# Patient Record
Sex: Female | Born: 1970 | Race: White | Hispanic: No | Smoking: Former smoker
Health system: Southern US, Community
[De-identification: ages and names within clinical notes are randomized; demographics above are authoritative.]

## PROBLEM LIST (undated history)

## (undated) ENCOUNTER — Ambulatory Visit: Payer: BC Managed Care – PPO

## (undated) DIAGNOSIS — F419 Anxiety disorder, unspecified: Secondary | ICD-10-CM

## (undated) DIAGNOSIS — F32A Depression, unspecified: Secondary | ICD-10-CM

## (undated) DIAGNOSIS — R000 Tachycardia, unspecified: Secondary | ICD-10-CM

## (undated) DIAGNOSIS — G4730 Sleep apnea, unspecified: Secondary | ICD-10-CM

## (undated) DIAGNOSIS — K219 Gastro-esophageal reflux disease without esophagitis: Secondary | ICD-10-CM

## (undated) HISTORY — DX: Gastro-esophageal reflux disease without esophagitis: K21.9

## (undated) HISTORY — DX: Depression, unspecified: F32.A

## (undated) HISTORY — DX: Anxiety disorder, unspecified: F41.9

---

## 2000-05-23 HISTORY — PX: WISDOM TOOTH EXTRACTION: SHX21

## 2019-05-24 HISTORY — PX: COLONOSCOPY: SHX174

## 2019-05-24 HISTORY — PX: ABLATION: SHX5711

## 2020-03-26 DIAGNOSIS — K5900 Constipation, unspecified: Secondary | ICD-10-CM | POA: Diagnosis not present

## 2020-03-26 DIAGNOSIS — R1013 Epigastric pain: Secondary | ICD-10-CM | POA: Diagnosis not present

## 2020-04-20 DIAGNOSIS — D122 Benign neoplasm of ascending colon: Secondary | ICD-10-CM | POA: Diagnosis not present

## 2020-04-20 DIAGNOSIS — Z1211 Encounter for screening for malignant neoplasm of colon: Secondary | ICD-10-CM | POA: Diagnosis not present

## 2020-04-20 DIAGNOSIS — K219 Gastro-esophageal reflux disease without esophagitis: Secondary | ICD-10-CM | POA: Diagnosis not present

## 2020-04-20 LAB — HM COLONOSCOPY

## 2020-04-28 DIAGNOSIS — R194 Change in bowel habit: Secondary | ICD-10-CM | POA: Diagnosis not present

## 2020-05-12 DIAGNOSIS — K635 Polyp of colon: Secondary | ICD-10-CM | POA: Diagnosis not present

## 2020-05-12 DIAGNOSIS — A048 Other specified bacterial intestinal infections: Secondary | ICD-10-CM | POA: Diagnosis not present

## 2020-05-27 DIAGNOSIS — Z0000 Encounter for general adult medical examination without abnormal findings: Secondary | ICD-10-CM | POA: Diagnosis not present

## 2020-05-27 DIAGNOSIS — B0089 Other herpesviral infection: Secondary | ICD-10-CM | POA: Diagnosis not present

## 2020-05-27 DIAGNOSIS — K2100 Gastro-esophageal reflux disease with esophagitis, without bleeding: Secondary | ICD-10-CM | POA: Diagnosis not present

## 2020-05-27 DIAGNOSIS — I10 Essential (primary) hypertension: Secondary | ICD-10-CM | POA: Diagnosis not present

## 2020-05-27 DIAGNOSIS — G609 Hereditary and idiopathic neuropathy, unspecified: Secondary | ICD-10-CM | POA: Diagnosis not present

## 2020-05-27 DIAGNOSIS — E559 Vitamin D deficiency, unspecified: Secondary | ICD-10-CM | POA: Diagnosis not present

## 2020-05-27 DIAGNOSIS — Z1231 Encounter for screening mammogram for malignant neoplasm of breast: Secondary | ICD-10-CM | POA: Diagnosis not present

## 2020-08-19 DIAGNOSIS — Z72820 Sleep deprivation: Secondary | ICD-10-CM | POA: Diagnosis not present

## 2020-08-19 DIAGNOSIS — F411 Generalized anxiety disorder: Secondary | ICD-10-CM | POA: Diagnosis not present

## 2020-08-19 DIAGNOSIS — K2100 Gastro-esophageal reflux disease with esophagitis, without bleeding: Secondary | ICD-10-CM | POA: Diagnosis not present

## 2020-09-03 ENCOUNTER — Ambulatory Visit
Admission: EM | Admit: 2020-09-03 | Discharge: 2020-09-03 | Disposition: A | Discharge status: Home / Self Care | Payer: BC Managed Care – PPO

## 2020-09-03 ENCOUNTER — Other Ambulatory Visit: Payer: Self-pay

## 2020-09-03 DIAGNOSIS — J309 Allergic rhinitis, unspecified: Secondary | ICD-10-CM | POA: Diagnosis not present

## 2020-09-03 HISTORY — DX: Tachycardia, unspecified: R00.0

## 2020-09-03 MED ORDER — PROMETHAZINE-DM 6.25-15 MG/5ML PO SYRP: 5.0000 mL | ORAL_SOLUTION | Freq: Four times a day (QID) | ORAL | 0 refills | Status: DC | PRN

## 2020-09-03 MED ORDER — IPRATROPIUM BROMIDE 0.06 % NA SOLN: 2.0000 | Freq: Four times a day (QID) | NASAL | 12 refills | Status: DC

## 2020-09-03 MED ORDER — BENZONATATE 100 MG PO CAPS: 200.0000 mg | ORAL_CAPSULE | Freq: Three times a day (TID) | ORAL | 0 refills | Status: DC

## 2020-09-03 NOTE — ED Triage Notes (Signed)
Patient complains of sinus pain and pressure, cough and drainage x Monday. States that she has also had a sore throat that she attributes to drainage.

## 2020-09-03 NOTE — Discharge Instructions (Addendum)
Switch her antihistamine to Allegra 180 mg once daily to help control her allergy symptoms.  Use the ipratropium nasal spray, 2 squirts in each nostril 4 times a day, as needed for nasal congestion and postnasal drip.  Use the Tessalon Perles during the day as needed for cough and the Promethazine DM cough syrup at bedtime.  The cough syrup will make you drowsy.

## 2020-09-03 NOTE — ED Provider Notes (Signed)
[facility]    CSN: [id number] Arrival date & time: 09/03/20  1637      History   Chief Complaint Chief Complaint  Patient presents with  . Cough    HPI Jamie Reeves is a 50 y.o. female.   HPI   50 year old female here for evaluation of sinus pain, cough, and sore throat.  Patient reports that she has had the above symptoms for the past 3 days and had a started after she did a lot of cleaning.  She reports that she has had some postnasal drip and this is triggered her cough, especially when she lays down.  Her cough is nonproductive.  Patient denies fever, ear pain or pressure, shortness breath or wheezing, GI complaints, or headache.  Past Medical History:  Diagnosis Date  . Tachycardia     There are no problems to display for this patient.   Past Surgical History:  Procedure Laterality Date  . NO PAST SURGERIES      OB History   No obstetric history on file.      Home Medications    Prior to Admission medications   Medication Sig Start Date End Date Taking? Authorizing Provider  benzonatate (TESSALON) 100 MG capsule Take 2 capsules (200 mg total) by mouth every 8 (eight) hours. 09/03/20  Yes Becky Augusta, NP  busPIRone (BUSPAR) 30 MG tablet Take 30 mg by mouth 2 (two) times daily.   Yes [provider]  diltiazem (CARDIZEM) 90 MG tablet Take 90 mg by mouth 4 (four) times daily.   Yes [provider]  DULoxetine (CYMBALTA) 60 MG capsule Take 60 mg by mouth daily.   Yes [provider]  ipratropium (ATROVENT) 0.06 % nasal spray Place 2 sprays into both nostrils 4 (four) times daily. 09/03/20  Yes Becky Augusta, NP  omeprazole (PRILOSEC) 40 MG capsule Take 40 mg by mouth daily.   Yes [provider]  promethazine-dextromethorphan (PROMETHAZINE-DM) 6.25-15 MG/5ML syrup Take 5 mLs by mouth 4 (four) times daily as needed. 09/03/20  Yes Becky Augusta, NP    Family History Family History  Problem Relation Age of Onset   . Heart attack Father     Social History Social History   Tobacco Use  . Smoking status: Never Smoker  . Smokeless tobacco: Never Used  Vaping Use  . Vaping Use: Every day  Substance Use Topics  . Alcohol use: Not Currently  . Drug use: Not Currently     Allergies   Tramadol   Review of Systems Review of Systems  Constitutional: Negative for activity change, appetite change and fever.  HENT: Positive for congestion, postnasal drip and rhinorrhea. Negative for ear pain.   Respiratory: Positive for cough. Negative for shortness of breath and wheezing.   Cardiovascular: Negative for chest pain.  Gastrointestinal: Negative for diarrhea, nausea and vomiting.  Musculoskeletal: Negative for arthralgias and myalgias.  Skin: Negative for rash.  Neurological: Negative for headaches.  Hematological: Negative.   Psychiatric/Behavioral: Negative.      Physical Exam Triage Vital Signs ED Triage Vitals [09/03/20 1658]  Enc Vitals Group     BP      Pulse      Resp      Temp      Temp src      SpO2      Weight 240 lb (108.9 kg)     Height 5\' 9"  (1.753 m)     Head Circumference      Peak Flow  Pain Score 5     Pain Loc      Pain Edu?      Excl. in GC?    No data found.  Updated Vital Signs BP 132/85 (BP Location: Left Arm)   Pulse 82   Temp 98.4 F (36.9 C) (Oral)   Resp 18   Ht 5\' 9"  (1.753 m)   Wt 240 lb (108.9 kg)   SpO2 98%   BMI 35.44 kg/m   Visual Acuity Right Eye Distance:   Left Eye Distance:   Bilateral Distance:    Right Eye Near:   Left Eye Near:    Bilateral Near:     Physical Exam Vitals and nursing note reviewed.  Constitutional:      General: She is not in acute distress.    Appearance: Normal appearance. She is not ill-appearing.  HENT:     Head: Normocephalic and atraumatic.     Right Ear: Tympanic membrane, ear canal and external ear normal.     Left Ear: Tympanic membrane, ear canal and external ear normal.     Nose:  Congestion and rhinorrhea present.     Mouth/Throat:     Mouth: Mucous membranes are moist.     Pharynx: Oropharynx is clear. No posterior oropharyngeal erythema.  Cardiovascular:     Rate and Rhythm: Normal rate and regular rhythm.     Pulses: Normal pulses.     Heart sounds: Normal heart sounds. No murmur heard. No gallop.   Pulmonary:     Effort: Pulmonary effort is normal.     Breath sounds: Normal breath sounds. No wheezing or rhonchi.  Musculoskeletal:     Cervical back: Normal range of motion and neck supple.  Lymphadenopathy:     Cervical: No cervical adenopathy.  Skin:    General: Skin is warm and dry.     Capillary Refill: Capillary refill takes less than 2 seconds.     Findings: No erythema or rash.  Neurological:     General: No focal deficit present.     Mental Status: She is alert and oriented to person, place, and time.  Psychiatric:        Mood and Affect: Mood normal.        Behavior: Behavior normal.        Thought Content: Thought content normal.        Judgment: Judgment normal.      UC Treatments / Results  Labs (all labs ordered are listed, but only abnormal results are displayed) Labs Reviewed - No data to display  EKG   Radiology No results found.  Procedures Procedures (including critical care time)  Medications Ordered in UC Medications - No data to display  Initial Impression / Assessment and Plan / UC Course  I have reviewed the triage vital signs and the nursing notes.  Pertinent labs & imaging results that were available during my care of the patient were reviewed by me and considered in my medical decision making (see chart for details).   Patient is a very pleasant 50 year old female here for evaluation of upper respiratory issues have been going on for the past 3 days.  Patient reports that she has had some nasal congestion with postnasal drip and cough that is nonproductive.  Patient is also had a sore throat.  All of her  symptoms started after cleaning this weekend and she thought it might be attributed to either the cleaning chemicals or to the dusty environment.  Patient just  went to be checked out because she states that she is prone to sinus infections.  Physical exam reveals pearly gray tympanic membranes bilaterally with normal light reflex and clear external auditory canals.  Nasal mucosa is mildly edematous and pale with scant clear nasal discharge.  Posterior oropharynx is pink and moist with mild clear postnasal drip.  No cervical lymphadenopathy on exam.  Lungs are clear to auscultation all fields.  Patient's exam is consistent with allergic rhinitis.  Patient states that she uses an OTC antihistamine daily but she forgot to take it today.  We will treat patient with ipratropium nasal spray, Tessalon Perles, and Promethazine DM cough syrup.   Final Clinical Impressions(s) / UC Diagnoses   Final diagnoses:  Allergic rhinitis, unspecified seasonality, unspecified trigger     Discharge Instructions     Switch her antihistamine to Allegra 180 mg once daily to help control her allergy symptoms.  Use the ipratropium nasal spray, 2 squirts in each nostril 4 times a day, as needed for nasal congestion and postnasal drip.  Use the Tessalon Perles during the day as needed for cough and the Promethazine DM cough syrup at bedtime.  The cough syrup will make you drowsy.    ED Prescriptions    Medication Sig Dispense Auth. Provider   ipratropium (ATROVENT) 0.06 % nasal spray Place 2 sprays into both nostrils 4 (four) times daily. 15 mL Becky Augusta, NP   benzonatate (TESSALON) 100 MG capsule Take 2 capsules (200 mg total) by mouth every 8 (eight) hours. 21 capsule Becky Augusta, NP   promethazine-dextromethorphan (PROMETHAZINE-DM) 6.25-15 MG/5ML syrup Take 5 mLs by mouth 4 (four) times daily as needed. 118 mL Becky Augusta, NP     PDMP not reviewed this encounter.   Becky Augusta, NP 09/03/20 1715

## 2020-10-22 DIAGNOSIS — G4733 Obstructive sleep apnea (adult) (pediatric): Secondary | ICD-10-CM | POA: Insufficient documentation

## 2020-10-22 DIAGNOSIS — R002 Palpitations: Secondary | ICD-10-CM | POA: Diagnosis not present

## 2020-10-22 DIAGNOSIS — E782 Mixed hyperlipidemia: Secondary | ICD-10-CM | POA: Diagnosis not present

## 2020-10-22 DIAGNOSIS — R0600 Dyspnea, unspecified: Secondary | ICD-10-CM | POA: Insufficient documentation

## 2020-10-22 DIAGNOSIS — R0602 Shortness of breath: Secondary | ICD-10-CM | POA: Diagnosis not present

## 2020-10-22 DIAGNOSIS — R000 Tachycardia, unspecified: Secondary | ICD-10-CM | POA: Diagnosis not present

## 2020-10-22 DIAGNOSIS — Z7689 Persons encountering health services in other specified circumstances: Secondary | ICD-10-CM | POA: Diagnosis not present

## 2020-10-22 HISTORY — DX: Palpitations: R00.2

## 2020-11-24 DIAGNOSIS — R000 Tachycardia, unspecified: Secondary | ICD-10-CM | POA: Diagnosis not present

## 2020-11-24 DIAGNOSIS — R0602 Shortness of breath: Secondary | ICD-10-CM | POA: Diagnosis not present

## 2020-12-01 DIAGNOSIS — R0602 Shortness of breath: Secondary | ICD-10-CM | POA: Diagnosis not present

## 2020-12-01 DIAGNOSIS — E782 Mixed hyperlipidemia: Secondary | ICD-10-CM | POA: Diagnosis not present

## 2020-12-01 DIAGNOSIS — R002 Palpitations: Secondary | ICD-10-CM | POA: Diagnosis not present

## 2020-12-01 DIAGNOSIS — G4733 Obstructive sleep apnea (adult) (pediatric): Secondary | ICD-10-CM | POA: Diagnosis not present

## 2021-02-02 ENCOUNTER — Ambulatory Visit: Payer: BC Managed Care – PPO | Admitting: Family Medicine

## 2021-02-02 ENCOUNTER — Other Ambulatory Visit: Payer: Self-pay

## 2021-02-02 ENCOUNTER — Encounter: Payer: Self-pay | Admitting: Family Medicine

## 2021-02-02 VITALS — BP 114/84 | HR 84 | Temp 98.4°F | Ht 69.0 in | Wt 243.0 lb

## 2021-02-02 DIAGNOSIS — F5101 Primary insomnia: Secondary | ICD-10-CM | POA: Diagnosis not present

## 2021-02-02 DIAGNOSIS — F419 Anxiety disorder, unspecified: Secondary | ICD-10-CM | POA: Insufficient documentation

## 2021-02-02 DIAGNOSIS — K219 Gastro-esophageal reflux disease without esophagitis: Secondary | ICD-10-CM | POA: Insufficient documentation

## 2021-02-02 DIAGNOSIS — F321 Major depressive disorder, single episode, moderate: Secondary | ICD-10-CM | POA: Insufficient documentation

## 2021-02-02 DIAGNOSIS — G4733 Obstructive sleep apnea (adult) (pediatric): Secondary | ICD-10-CM

## 2021-02-02 DIAGNOSIS — G4700 Insomnia, unspecified: Secondary | ICD-10-CM | POA: Insufficient documentation

## 2021-02-02 DIAGNOSIS — F418 Other specified anxiety disorders: Secondary | ICD-10-CM | POA: Insufficient documentation

## 2021-02-02 MED ORDER — QUVIVIQ 50 MG PO TABS: 50.0000 mg | ORAL_TABLET | Freq: Every day | ORAL | 0 refills | Status: DC

## 2021-02-02 MED ORDER — OMEPRAZOLE 40 MG PO CPDR: 40.0000 mg | DELAYED_RELEASE_CAPSULE | Freq: Every day | ORAL | 0 refills | Status: DC

## 2021-02-02 NOTE — Assessment & Plan Note (Signed)
>>  ASSESSMENT AND PLAN FOR ANXIETY WRITTEN ON 02/02/2021 12:30 PM BY Lizmarie Witters, Earley Abide, MD  Chronic issue with current exacerbation due to familial obligations and additional life stressors, has been stable on buspirone 30 mg daily, duloxetine 60 mg.  Is noting insomnia.  Pharmacologic and nonpharmacologic treatments reviewed, a referral to psychiatry was placed for additional management.  Interim information provided for mindfulness and exercise therapy.

## 2021-02-02 NOTE — Patient Instructions (Addendum)
- Referral coordinator will contact you in regards to follow-up with psychiatry - Review information below on mindfulness, increase moderate physical activity (aim for 20 minutes daily) - Stop diphenhydramine at bedtime - Start Quviviq nightly at bedtime, 15-30 minutes before bed - Return for follow-up in 4 weeks, contact us for questions between now and then  Mindfulness-Based Stress Reduction Mindfulness-based stress reduction (MBSR) is a program that helps you learn to practice mindfulness. Mindfulness is the practice of intentionally paying attention to the present moment. MBSR focuses on developing self-awareness, which allows you to respond to life stress without judgment or negative emotions. It can be learned and practiced through techniques such as education, breathing exercises, meditation, and yoga. MBSR includes several mindfulness techniques in one program. MBSR works best when you understand the treatment, are willing to try new things, and can commit to spending time practicing what you learn.  What are the Benefits of MBSR? MBSR has been shown to have many benefits, which include helping you to: Reduce negative emotions, such as depression and anxiety. Improve memory and focus. Change how you sense and approach pain. Boost your body's ability to fight infections. Help you connect better with other people. Improve your sense of well-being. Follow These Instructions at Home:  Find a local in-person or online MBSR program. Set aside some time regularly for mindfulness practice. Find a mindfulness practice that works best for you. This may include one or more of the following: Meditation. Meditation involves focusing your mind on a certain thought or activity. Breathing awareness exercises. These help you to stay present by focusing on your breath. Body scan. For this practice, you lie down and pay attention to each part of your body from head to toe. You can identify tension and  soreness and intentionally relax parts of your body. Yoga. Yoga involves stretching and breathing, and it can improve your ability to move and be flexible. It can also provide an experience of testing your body's limits, which can help you release stress. Mindful eating. This way of eating involves focusing on the taste, texture, color, and smell of each bite of food. Because this slows down eating and helps you feel full sooner, it can be an important part of a weight-loss plan. Find a podcast or recording that provides guidance for breathing awareness, body scan, or meditation exercises. You can listen to these any time when you have a free moment to rest without distractions. Follow your treatment plan as told by your health care provider. This may include taking regular medicines and making changes to your diet or lifestyle as recommended. How to Practice Mindfulness: To do a basic awareness exercise: Find a comfortable place to sit or lay down. Pay attention to the present moment. Observe your thoughts, feelings, and surroundings just as they are. Avoid placing judgment on yourself, your feelings, or your surroundings. Make note of any judgment that comes up, and let it go. Your mind may wander, and that is okay. Make note of when your thoughts drift, and return your attention to the present moment. To do basic mindfulness meditation: Find a comfortable place to sit or lay down. This may include a stable chair, your bed, or a firm floor cushion. Sit upright with your back straight. Let your arms fall next to your side with your hands resting on your legs. If sitting in a chair, rest your feet flat on the floor. If sitting on a cushion, cross your legs in front of you. If  laying down, let your limbs fully relax. Keep your head in a neutral position with your chin dropped slightly. Relax your jaw and rest the tip of your tongue on the roof of your mouth. Drop your gaze to the floor. You can close  your eyes if you like. Breathe normally and pay attention to your breath. Feel the air moving in and out of your nose. Feel your belly expanding and relaxing with each breath. Your mind may wander, and that is okay. Make note of when your thoughts drift, and return your attention to your breath. Avoid placing judgment on yourself, your feelings, or your surroundings. Make note of any judgment or feelings that come up, let them go, and bring your attention back to your breath. When you are ready, lift your gaze or open your eyes. Pay attention to how your body feels after the meditation.  Exercise Recommendations Physical activity should be focused on getting 150 to 300 minutes per week of moderate to vigorous activity (30 minutes of activity at least 5 days of the week at a level of intensity where you can carry on a conversation without being out of breath). However, any increase in activity is beneficial to your health! Physical activity reduces symptoms of depression and anxiety and improves sleep quality (increase the time in deep sleep and reduce daytime sleepiness). Benefits include weight loss, improved insulin sensitivity, less adiposity, and increased bone health. Walking lowers systolic and diastolic blood pressures as well as your resting heart rate.  Cognitive benefits of exercise include short-term improvements in executive function, memory, processing speed, attention, and academic performance Increasing physical activity as we age can help [place] maintain independence by reducing cognitive decline and falls.

## 2021-02-02 NOTE — Assessment & Plan Note (Signed)
Chronic issue that is stable with daily omeprazole, Rx for 90 days sent to the patient's pharmacy
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
## 2021-02-02 NOTE — Assessment & Plan Note (Signed)
This is a chronic issue that is stable, does have a sleep medicine physician/cardiology that she follows with, next follow-up in 1 year.

## 2021-02-02 NOTE — Assessment & Plan Note (Signed)
Chronic issue with ongoing exacerbation, currently being treated with diphenhydramine 50 mg OTC, not feeling quality sleep.  I reviewed the nature of this medication and link to her other medical history.  She is amenable to initiation of Quviviq, 30-day supply sent to her pharmacy.  We will track her response at follow-up in 4 weeks.

## 2021-02-02 NOTE — Assessment & Plan Note (Addendum)
Chronic issue with current exacerbation due to familial obligations and additional life stressors, has been stable on buspirone 30 mg daily, duloxetine 60 mg.  Is noting insomnia.  Pharmacologic and nonpharmacologic treatments reviewed, a referral to psychiatry was placed for additional management.  Interim information provided for mindfulness and exercise therapy.

## 2021-02-08 ENCOUNTER — Telehealth: Payer: Self-pay | Admitting: Family Medicine

## 2021-02-08 DIAGNOSIS — F5101 Primary insomnia: Secondary | ICD-10-CM

## 2021-02-08 MED ORDER — QUVIVIQ 50 MG PO TABS: 50.0000 mg | ORAL_TABLET | Freq: Every day | ORAL | 0 refills | Status: DC

## 2021-02-08 NOTE — Telephone Encounter (Signed)
Pt is calling Dr. Ashley Royalty to request vouchers for Daridorexant HCl (QUVIVIQ) 50 MG TABS [[id number]. Medication was $500 and Pts insurance would not cover it. Please advise CB- [phone]

## 2021-02-08 NOTE — Telephone Encounter (Signed)
For your information  

## 2021-02-09 ENCOUNTER — Other Ambulatory Visit: Payer: Self-pay | Admitting: Family Medicine

## 2021-02-09 DIAGNOSIS — F5101 Primary insomnia: Secondary | ICD-10-CM

## 2021-02-09 MED ORDER — QUVIVIQ 50 MG PO TABS: 50.0000 mg | ORAL_TABLET | Freq: Every day | ORAL | 0 refills | Status: DC

## 2021-02-09 MED ORDER — QUVIVIQ 50 MG PO TABS: 50.0000 mg | ORAL_TABLET | Freq: Every day | ORAL | 0 refills | Status: AC

## 2021-02-12 ENCOUNTER — Telehealth: Payer: Self-pay | Admitting: Family Medicine

## 2021-02-12 NOTE — Telephone Encounter (Signed)
Enidza with [facility] is calling and will refax the PA for quviviq  please answer the clinical questions through cover my meds

## 2021-02-12 NOTE — Telephone Encounter (Signed)
PA sent through CoverMyMeds.  Pending decision. 

## 2021-02-15 NOTE — Telephone Encounter (Signed)
PA denied.  Please advise for alternative therapy if applicable.

## 2021-03-02 ENCOUNTER — Ambulatory Visit
Admission: RE | Admit: 2021-03-02 | Discharge: 2021-03-02 | Disposition: A | Discharge status: Home / Self Care | Payer: BC Managed Care – PPO | Attending: Family Medicine | Admitting: Family Medicine

## 2021-03-02 ENCOUNTER — Encounter: Payer: Self-pay | Admitting: Family Medicine

## 2021-03-02 ENCOUNTER — Other Ambulatory Visit: Payer: Self-pay

## 2021-03-02 ENCOUNTER — Ambulatory Visit
Admission: RE | Admit: 2021-03-02 | Discharge: 2021-03-02 | Disposition: A | Discharge status: Home / Self Care | Payer: BC Managed Care – PPO | Source: Ambulatory Visit | Attending: Family Medicine | Admitting: Family Medicine

## 2021-03-02 ENCOUNTER — Other Ambulatory Visit: Payer: Self-pay | Admitting: Family Medicine

## 2021-03-02 ENCOUNTER — Ambulatory Visit (INDEPENDENT_AMBULATORY_CARE_PROVIDER_SITE_OTHER): Payer: BC Managed Care – PPO | Admitting: Family Medicine

## 2021-03-02 VITALS — BP 108/74 | HR 102 | Temp 98.6°F | Ht 69.0 in | Wt 243.0 lb

## 2021-03-02 DIAGNOSIS — F419 Anxiety disorder, unspecified: Secondary | ICD-10-CM

## 2021-03-02 DIAGNOSIS — G8929 Other chronic pain: Secondary | ICD-10-CM

## 2021-03-02 DIAGNOSIS — J309 Allergic rhinitis, unspecified: Secondary | ICD-10-CM

## 2021-03-02 DIAGNOSIS — M5441 Lumbago with sciatica, right side: Secondary | ICD-10-CM

## 2021-03-02 DIAGNOSIS — G4733 Obstructive sleep apnea (adult) (pediatric): Secondary | ICD-10-CM

## 2021-03-02 DIAGNOSIS — R7989 Other specified abnormal findings of blood chemistry: Secondary | ICD-10-CM

## 2021-03-02 DIAGNOSIS — Z720 Tobacco use: Secondary | ICD-10-CM

## 2021-03-02 DIAGNOSIS — Z0000 Encounter for general adult medical examination without abnormal findings: Secondary | ICD-10-CM

## 2021-03-02 DIAGNOSIS — Z1159 Encounter for screening for other viral diseases: Secondary | ICD-10-CM

## 2021-03-02 DIAGNOSIS — G2581 Restless legs syndrome: Secondary | ICD-10-CM | POA: Diagnosis not present

## 2021-03-02 DIAGNOSIS — Z01419 Encounter for gynecological examination (general) (routine) without abnormal findings: Secondary | ICD-10-CM

## 2021-03-02 DIAGNOSIS — Z114 Encounter for screening for human immunodeficiency virus [HIV]: Secondary | ICD-10-CM

## 2021-03-02 DIAGNOSIS — Z23 Encounter for immunization: Secondary | ICD-10-CM

## 2021-03-02 DIAGNOSIS — M5442 Lumbago with sciatica, left side: Secondary | ICD-10-CM

## 2021-03-02 DIAGNOSIS — E782 Mixed hyperlipidemia: Secondary | ICD-10-CM

## 2021-03-02 DIAGNOSIS — M545 Low back pain, unspecified: Secondary | ICD-10-CM | POA: Diagnosis not present

## 2021-03-02 DIAGNOSIS — F5101 Primary insomnia: Secondary | ICD-10-CM

## 2021-03-02 MED ORDER — MELOXICAM 15 MG PO TABS: 15.0000 mg | ORAL_TABLET | Freq: Every day | ORAL | 0 refills | Status: DC

## 2021-03-02 MED ORDER — VARENICLINE TARTRATE 0.5 MG X 11 & 1 MG X 42 PO MISC: ORAL | 0 refills | Status: DC

## 2021-03-02 NOTE — Assessment & Plan Note (Signed)
Labs ordered.

## 2021-03-02 NOTE — Assessment & Plan Note (Signed)
Psychiatry referral placed at last visit, awaiting scheduling.  In the interim she is to continue her current BuSpar 30 mg and duloxetine 60 mg.  Treating comorbid insomnia.

## 2021-03-02 NOTE — Patient Instructions (Addendum)
-   Obtain X-rays of low back order provided - Start meloxicam daily with dinner x2 weeks then as needed - Continue antihistamine, start Flonase (fluticasone propionate) until your follow-up with ENT the - Obtain fasting labs with orders provided (can have water or black coffee but otherwise no food or drink x 8 hours before labs) - Review information provided - Attend eye doctor annually, dentist every 6 months, work towards or maintain 30 minutes of moderate intensity physical activity at least 5 days per week, and consume a balanced diet - Return in 1 year for physical - Contact us for any questions between now and then     [facility]:  [phone]
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
## 2021-03-02 NOTE — Assessment & Plan Note (Signed)
States interval improvement following initiation of Quviviq, does note issues with her sleep due to what she describes as bilateral leg pain and sensation of needing to move them.  She does give a comorbid history of low back chronic pain, dedicated lumbar films and iron panel ordered.

## 2021-03-02 NOTE — Assessment & Plan Note (Signed)
>>  ASSESSMENT AND PLAN FOR ANXIETY WRITTEN ON 03/02/2021 12:53 PM BY Hanif Radin, Earley Abide, MD  Psychiatry referral placed at last visit, awaiting scheduling.  In the interim she is to continue her current BuSpar 30 mg and duloxetine 60 mg.  Treating comorbid insomnia.

## 2021-03-02 NOTE — Assessment & Plan Note (Signed)
Chronic and stable condition, referral to ENT placed for this and noted allergic rhinitis in the setting of recurrent sinusitis.

## 2021-03-03 ENCOUNTER — Telehealth: Payer: Self-pay

## 2021-03-03 DIAGNOSIS — G4733 Obstructive sleep apnea (adult) (pediatric): Secondary | ICD-10-CM | POA: Diagnosis not present

## 2021-03-03 DIAGNOSIS — E782 Mixed hyperlipidemia: Secondary | ICD-10-CM | POA: Diagnosis not present

## 2021-03-03 DIAGNOSIS — F419 Anxiety disorder, unspecified: Secondary | ICD-10-CM | POA: Diagnosis not present

## 2021-03-03 DIAGNOSIS — F5101 Primary insomnia: Secondary | ICD-10-CM | POA: Diagnosis not present

## 2021-03-03 DIAGNOSIS — Z0000 Encounter for general adult medical examination without abnormal findings: Secondary | ICD-10-CM | POA: Diagnosis not present

## 2021-03-03 DIAGNOSIS — Z114 Encounter for screening for human immunodeficiency virus [HIV]: Secondary | ICD-10-CM | POA: Diagnosis not present

## 2021-03-03 DIAGNOSIS — Z1159 Encounter for screening for other viral diseases: Secondary | ICD-10-CM | POA: Diagnosis not present

## 2021-03-03 NOTE — Telephone Encounter (Signed)
Patient is scheduled for 03/18/21 with JEG

## 2021-03-03 NOTE — Telephone Encounter (Signed)
[facility] referring for Well woman exam with routine gynecological exam. [place]. Called and left voicemail for patient to call back to be scheduled.

## 2021-03-04 LAB — IRON,TIBC AND FERRITIN PANEL
Ferritin: 30 ng/mL (ref 15–150)
Iron Saturation: 22 % (ref 15–55)
Iron: 81 ug/dL (ref 27–159)
Total Iron Binding Capacity: 362 ug/dL (ref 250–450)
UIBC: 281 ug/dL (ref 131–425)

## 2021-03-04 LAB — CBC WITH DIFFERENTIAL/PLATELET
Basophils Absolute: 0.1 10*3/uL (ref 0.0–0.2)
Basos: 1 %
EOS (ABSOLUTE): 0.1 10*3/uL (ref 0.0–0.4)
Eos: 2 %
Hematocrit: 38.1 % (ref 34.0–46.6)
Hemoglobin: 13.1 g/dL (ref 11.1–15.9)
Immature Grans (Abs): 0 10*3/uL (ref 0.0–0.1)
Immature Granulocytes: 0 %
Lymphocytes Absolute: 1.7 10*3/uL (ref 0.7–3.1)
Lymphs: 30 %
MCH: 28.3 pg (ref 26.6–33.0)
MCHC: 34.4 g/dL (ref 31.5–35.7)
MCV: 82 fL (ref 79–97)
Monocytes Absolute: 0.4 10*3/uL (ref 0.1–0.9)
Monocytes: 7 %
Neutrophils Absolute: 3.4 10*3/uL (ref 1.4–7.0)
Neutrophils: 60 %
Platelets: 285 10*3/uL (ref 150–450)
RBC: 4.63 x10E6/uL (ref 3.77–5.28)
RDW: 13.1 % (ref 11.7–15.4)
WBC: 5.6 10*3/uL (ref 3.4–10.8)

## 2021-03-04 LAB — COMPREHENSIVE METABOLIC PANEL
ALT: 19 IU/L (ref 0–32)
AST: 22 IU/L (ref 0–40)
Albumin/Globulin Ratio: 1.6 (ref 1.2–2.2)
Albumin: 4.1 g/dL (ref 3.8–4.8)
Alkaline Phosphatase: 88 IU/L (ref 44–121)
BUN/Creatinine Ratio: 13 (ref 9–23)
BUN: 11 mg/dL (ref 6–24)
Bilirubin Total: 0.5 mg/dL (ref 0.0–1.2)
CO2: 21 mmol/L (ref 20–29)
Calcium: 9.3 mg/dL (ref 8.7–10.2)
Chloride: 105 mmol/L (ref 96–106)
Creatinine, Ser: 0.84 mg/dL (ref 0.57–1.00)
Globulin, Total: 2.5 g/dL (ref 1.5–4.5)
Glucose: 97 mg/dL (ref 70–99)
Potassium: 4.6 mmol/L (ref 3.5–5.2)
Sodium: 139 mmol/L (ref 134–144)
Total Protein: 6.6 g/dL (ref 6.0–8.5)
eGFR: 85 mL/min/{1.73_m2} (ref >59)

## 2021-03-04 LAB — VITAMIN D 25 HYDROXY (VIT D DEFICIENCY, FRACTURES): Vit D, 25-Hydroxy: 28 ng/mL — ABNORMAL LOW (ref 30.0–100.0)

## 2021-03-04 LAB — B12 AND FOLATE PANEL
Folate: 8.7 ng/mL (ref >3.0)
Vitamin B-12: 304 pg/mL (ref 232–1245)

## 2021-03-04 LAB — LIPID PANEL
Chol/HDL Ratio: 4 ratio (ref 0.0–4.4)
Cholesterol, Total: 178 mg/dL (ref 100–199)
HDL: 45 mg/dL (ref >39)
LDL Chol Calc (NIH): 114 mg/dL — ABNORMAL HIGH (ref 0–99)
Triglycerides: 102 mg/dL (ref 0–149)
VLDL Cholesterol Cal: 19 mg/dL (ref 5–40)

## 2021-03-04 LAB — TSH RFX ON ABNORMAL TO FREE T4: TSH: 3.01 u[IU]/mL (ref 0.450–4.500)

## 2021-03-04 LAB — HEPATITIS C ANTIBODY: Hep C Virus Ab: <0.1 s/co ratio (ref 0.0–0.9)

## 2021-03-04 LAB — HIV ANTIBODY (ROUTINE TESTING W REFLEX): HIV Screen 4th Generation wRfx: NONREACTIVE

## 2021-03-08 ENCOUNTER — Encounter: Payer: Self-pay | Admitting: Family Medicine

## 2021-03-08 NOTE — Telephone Encounter (Signed)
For your information  

## 2021-03-10 ENCOUNTER — Other Ambulatory Visit: Payer: Self-pay

## 2021-03-10 ENCOUNTER — Encounter: Payer: Self-pay | Admitting: Family Medicine

## 2021-03-10 ENCOUNTER — Ambulatory Visit: Payer: BC Managed Care – PPO | Admitting: Family Medicine

## 2021-03-10 VITALS — BP 102/68 | HR 108 | Ht 69.0 in | Wt 244.0 lb

## 2021-03-10 DIAGNOSIS — Z0000 Encounter for general adult medical examination without abnormal findings: Secondary | ICD-10-CM | POA: Diagnosis not present

## 2021-03-10 DIAGNOSIS — G8929 Other chronic pain: Secondary | ICD-10-CM

## 2021-03-10 DIAGNOSIS — M47817 Spondylosis without myelopathy or radiculopathy, lumbosacral region: Secondary | ICD-10-CM | POA: Insufficient documentation

## 2021-03-10 DIAGNOSIS — E669 Obesity, unspecified: Secondary | ICD-10-CM

## 2021-03-10 DIAGNOSIS — Z6836 Body mass index (BMI) 36.0-36.9, adult: Secondary | ICD-10-CM

## 2021-03-10 DIAGNOSIS — M5441 Lumbago with sciatica, right side: Secondary | ICD-10-CM | POA: Diagnosis not present

## 2021-03-10 DIAGNOSIS — M5442 Lumbago with sciatica, left side: Secondary | ICD-10-CM

## 2021-03-10 DIAGNOSIS — K219 Gastro-esophageal reflux disease without esophagitis: Secondary | ICD-10-CM

## 2021-03-10 DIAGNOSIS — F5101 Primary insomnia: Secondary | ICD-10-CM

## 2021-03-10 DIAGNOSIS — E662 Morbid (severe) obesity with alveolar hypoventilation: Secondary | ICD-10-CM | POA: Insufficient documentation

## 2021-03-10 MED ORDER — MELOXICAM 15 MG PO TABS: 15.0000 mg | ORAL_TABLET | Freq: Every day | ORAL | 0 refills | Status: DC | PRN

## 2021-03-10 NOTE — Patient Instructions (Signed)
-   Continue meloxicam daily for additional 3 weeks (take with food), afterwards take daily on an as-needed basis - Referral coordinator will contact you for nutrition evaluation and physical therapy appointment - Contact number for ENT/sleep medicine [phone] - Start OTC vitamin D3 supplementation [phone] IU daily - Return for follow-up in 6 weeks

## 2021-03-11 ENCOUNTER — Encounter: Payer: Self-pay | Admitting: Family Medicine

## 2021-03-11 DIAGNOSIS — Z0000 Encounter for general adult medical examination without abnormal findings: Secondary | ICD-10-CM | POA: Insufficient documentation

## 2021-03-11 NOTE — Assessment & Plan Note (Signed)
Stable on current regimen   

## 2021-03-11 NOTE — Assessment & Plan Note (Signed)
Recent x-rays with elements of degeneration of the lower lumbar spine focally, treatment strategies outlined and she is amenable to continue meloxicam for 3 weeks and as needed dosing, formal physical therapy.  We will reevaluate symptoms in 6 weeks time, suboptimal progress, anticipate advanced imaging versus local injections versus modification of pharmacotherapy.

## 2021-03-11 NOTE — Assessment & Plan Note (Signed)
Annual examination completed, risk stratification labs ordered, anticipatory guidance provided, colonoscopy ordered.

## 2021-03-11 NOTE — Assessment & Plan Note (Addendum)
Noted condition in the setting of OSA, she has not seen sleep medicine in quite some time, a referral has been placed for further evaluation and management.  Can continue Quviviq over the interim as she is reporting benefit.

## 2021-03-11 NOTE — Assessment & Plan Note (Signed)
Calorie counting advised, referral to nutritionist placed.
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
## 2021-03-18 ENCOUNTER — Other Ambulatory Visit (HOSPITAL_COMMUNITY)
Admission: RE | Admit: 2021-03-18 | Discharge: 2021-03-18 | Disposition: A | Discharge status: Home / Self Care | Payer: BC Managed Care – PPO | Source: Ambulatory Visit | Attending: Advanced Practice Midwife | Admitting: Advanced Practice Midwife

## 2021-03-18 ENCOUNTER — Ambulatory Visit: Payer: BC Managed Care – PPO | Admitting: Advanced Practice Midwife

## 2021-03-18 ENCOUNTER — Encounter: Payer: Self-pay | Admitting: Advanced Practice Midwife

## 2021-03-18 ENCOUNTER — Other Ambulatory Visit: Payer: Self-pay

## 2021-03-18 VITALS — BP 136/77 | Ht 69.0 in | Wt 246.0 lb

## 2021-03-18 DIAGNOSIS — Z124 Encounter for screening for malignant neoplasm of cervix: Secondary | ICD-10-CM | POA: Insufficient documentation
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
## 2021-03-19 ENCOUNTER — Encounter: Payer: Self-pay | Admitting: Family Medicine

## 2021-03-22 NOTE — Telephone Encounter (Signed)
Patient has an appointment with ENT 04/14/21.  For your information.

## 2021-03-23 LAB — CYTOLOGY - PAP
Comment: NEGATIVE
Diagnosis: NEGATIVE
High risk HPV: NEGATIVE

## 2021-03-24 ENCOUNTER — Ambulatory Visit: Payer: BC Managed Care – PPO | Attending: Family Medicine | Admitting: Physical Therapy

## 2021-03-24 ENCOUNTER — Other Ambulatory Visit: Payer: Self-pay

## 2021-03-24 DIAGNOSIS — G8929 Other chronic pain: Secondary | ICD-10-CM | POA: Insufficient documentation

## 2021-03-24 DIAGNOSIS — R262 Difficulty in walking, not elsewhere classified: Secondary | ICD-10-CM | POA: Insufficient documentation

## 2021-03-24 DIAGNOSIS — M545 Low back pain, unspecified: Secondary | ICD-10-CM | POA: Insufficient documentation

## 2021-03-24 DIAGNOSIS — M25552 Pain in left hip: Secondary | ICD-10-CM | POA: Insufficient documentation

## 2021-03-24 DIAGNOSIS — M6281 Muscle weakness (generalized): Secondary | ICD-10-CM | POA: Insufficient documentation

## 2021-03-24 DIAGNOSIS — M47817 Spondylosis without myelopathy or radiculopathy, lumbosacral region: Secondary | ICD-10-CM | POA: Insufficient documentation
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
## 2021-03-26 ENCOUNTER — Encounter: Payer: Self-pay | Admitting: Physical Therapy

## 2021-03-29 ENCOUNTER — Ambulatory Visit: Payer: BC Managed Care – PPO | Admitting: Physical Therapy

## 2021-03-29 NOTE — Addendum Note (Signed)
Addended by: Consuela Mimes T on: 03/29/2021 06:40 AM   Modules accepted: Orders

## 2021-03-30 ENCOUNTER — Ambulatory Visit: Payer: BC Managed Care – PPO | Admitting: Physical Therapy

## 2021-03-30 ENCOUNTER — Other Ambulatory Visit: Payer: Self-pay

## 2021-03-30 DIAGNOSIS — M545 Low back pain, unspecified: Secondary | ICD-10-CM | POA: Diagnosis not present

## 2021-03-30 DIAGNOSIS — M25552 Pain in left hip: Secondary | ICD-10-CM | POA: Diagnosis not present

## 2021-03-30 DIAGNOSIS — G8929 Other chronic pain: Secondary | ICD-10-CM | POA: Diagnosis not present

## 2021-03-30 DIAGNOSIS — R262 Difficulty in walking, not elsewhere classified: Secondary | ICD-10-CM | POA: Diagnosis not present

## 2021-03-30 DIAGNOSIS — M47817 Spondylosis without myelopathy or radiculopathy, lumbosacral region: Secondary | ICD-10-CM | POA: Diagnosis not present

## 2021-03-30 DIAGNOSIS — M6281 Muscle weakness (generalized): Secondary | ICD-10-CM
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
## 2021-03-31 ENCOUNTER — Encounter: Payer: Self-pay | Admitting: Physical Therapy

## 2021-04-01 ENCOUNTER — Encounter: Payer: BC Managed Care – PPO | Admitting: Physical Therapy

## 2021-04-05 ENCOUNTER — Other Ambulatory Visit: Payer: Self-pay

## 2021-04-05 ENCOUNTER — Encounter: Payer: Self-pay | Admitting: Physical Therapy

## 2021-04-05 ENCOUNTER — Ambulatory Visit: Payer: BC Managed Care – PPO | Admitting: Physical Therapy

## 2021-04-05 DIAGNOSIS — M47817 Spondylosis without myelopathy or radiculopathy, lumbosacral region: Secondary | ICD-10-CM | POA: Diagnosis not present

## 2021-04-05 DIAGNOSIS — M6281 Muscle weakness (generalized): Secondary | ICD-10-CM

## 2021-04-05 DIAGNOSIS — G8929 Other chronic pain: Secondary | ICD-10-CM | POA: Diagnosis not present

## 2021-04-05 DIAGNOSIS — M25552 Pain in left hip: Secondary | ICD-10-CM | POA: Diagnosis not present

## 2021-04-05 DIAGNOSIS — M545 Low back pain, unspecified: Secondary | ICD-10-CM

## 2021-04-05 DIAGNOSIS — R262 Difficulty in walking, not elsewhere classified: Secondary | ICD-10-CM | POA: Diagnosis not present
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
## 2021-04-07 ENCOUNTER — Encounter: Payer: BC Managed Care – PPO | Admitting: Physical Therapy

## 2021-04-12 ENCOUNTER — Ambulatory Visit: Payer: BC Managed Care – PPO | Admitting: Physical Therapy

## 2021-04-12 ENCOUNTER — Encounter: Payer: Self-pay | Admitting: Physical Therapy

## 2021-04-12 ENCOUNTER — Other Ambulatory Visit: Payer: Self-pay

## 2021-04-12 DIAGNOSIS — M545 Low back pain, unspecified: Secondary | ICD-10-CM | POA: Diagnosis not present

## 2021-04-12 DIAGNOSIS — G8929 Other chronic pain: Secondary | ICD-10-CM

## 2021-04-12 DIAGNOSIS — M6281 Muscle weakness (generalized): Secondary | ICD-10-CM

## 2021-04-12 DIAGNOSIS — M47817 Spondylosis without myelopathy or radiculopathy, lumbosacral region: Secondary | ICD-10-CM | POA: Diagnosis not present

## 2021-04-12 DIAGNOSIS — M25552 Pain in left hip: Secondary | ICD-10-CM | POA: Diagnosis not present

## 2021-04-12 DIAGNOSIS — R262 Difficulty in walking, not elsewhere classified: Secondary | ICD-10-CM | POA: Diagnosis not present
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
## 2021-04-14 ENCOUNTER — Ambulatory Visit: Payer: BC Managed Care – PPO | Admitting: Physical Therapy

## 2021-04-19 ENCOUNTER — Ambulatory Visit: Payer: BC Managed Care – PPO | Admitting: Physical Therapy

## 2021-04-21 ENCOUNTER — Other Ambulatory Visit: Payer: Self-pay

## 2021-04-21 ENCOUNTER — Encounter: Payer: Self-pay | Admitting: Physical Therapy

## 2021-04-21 ENCOUNTER — Ambulatory Visit: Payer: BC Managed Care – PPO | Admitting: Physical Therapy

## 2021-04-21 DIAGNOSIS — R262 Difficulty in walking, not elsewhere classified: Secondary | ICD-10-CM | POA: Diagnosis not present

## 2021-04-21 DIAGNOSIS — M6281 Muscle weakness (generalized): Secondary | ICD-10-CM

## 2021-04-21 DIAGNOSIS — G8929 Other chronic pain: Secondary | ICD-10-CM | POA: Diagnosis not present

## 2021-04-21 DIAGNOSIS — M47817 Spondylosis without myelopathy or radiculopathy, lumbosacral region: Secondary | ICD-10-CM | POA: Diagnosis not present

## 2021-04-21 DIAGNOSIS — M545 Low back pain, unspecified: Secondary | ICD-10-CM | POA: Diagnosis not present

## 2021-04-21 DIAGNOSIS — M25552 Pain in left hip: Secondary | ICD-10-CM | POA: Diagnosis not present
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
## 2021-04-26 ENCOUNTER — Ambulatory Visit: Payer: BC Managed Care – PPO | Admitting: Physical Therapy

## 2021-04-27 ENCOUNTER — Ambulatory Visit: Payer: BC Managed Care – PPO | Admitting: Family Medicine

## 2021-04-28 ENCOUNTER — Ambulatory Visit: Payer: BC Managed Care – PPO | Admitting: Physical Therapy

## 2021-04-29 ENCOUNTER — Other Ambulatory Visit: Payer: Self-pay

## 2021-04-29 ENCOUNTER — Ambulatory Visit: Payer: BC Managed Care – PPO | Attending: Family Medicine | Admitting: Physical Therapy

## 2021-04-29 DIAGNOSIS — G8929 Other chronic pain: Secondary | ICD-10-CM | POA: Diagnosis not present

## 2021-04-29 DIAGNOSIS — M6281 Muscle weakness (generalized): Secondary | ICD-10-CM | POA: Diagnosis not present

## 2021-04-29 DIAGNOSIS — M545 Low back pain, unspecified: Secondary | ICD-10-CM | POA: Diagnosis not present
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
## 2021-05-03 ENCOUNTER — Ambulatory Visit: Payer: BC Managed Care – PPO | Admitting: Physical Therapy

## 2021-05-03 ENCOUNTER — Other Ambulatory Visit: Payer: Self-pay

## 2021-05-03 ENCOUNTER — Encounter: Payer: Self-pay | Admitting: Physical Therapy

## 2021-05-03 DIAGNOSIS — M6281 Muscle weakness (generalized): Secondary | ICD-10-CM | POA: Diagnosis not present

## 2021-05-03 DIAGNOSIS — G8929 Other chronic pain: Secondary | ICD-10-CM | POA: Diagnosis not present

## 2021-05-03 DIAGNOSIS — M545 Low back pain, unspecified: Secondary | ICD-10-CM | POA: Diagnosis not present
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
## 2021-05-04 ENCOUNTER — Encounter: Payer: Self-pay | Admitting: Physical Therapy

## 2021-05-05 ENCOUNTER — Encounter: Payer: Self-pay | Admitting: Family Medicine

## 2021-05-05 ENCOUNTER — Ambulatory Visit: Payer: BC Managed Care – PPO | Admitting: Physical Therapy

## 2021-05-05 DIAGNOSIS — M5442 Lumbago with sciatica, left side: Secondary | ICD-10-CM

## 2021-05-05 DIAGNOSIS — M47817 Spondylosis without myelopathy or radiculopathy, lumbosacral region: Secondary | ICD-10-CM

## 2021-05-05 DIAGNOSIS — G8929 Other chronic pain: Secondary | ICD-10-CM

## 2021-05-05 MED ORDER — MELOXICAM 15 MG PO TABS: 15.0000 mg | ORAL_TABLET | Freq: Every day | ORAL | 0 refills | Status: DC | PRN

## 2021-05-05 NOTE — Telephone Encounter (Signed)
Okay to refill meloxicam?  Patient has a follow-up appointment on 05/13/21.

## 2021-05-06 ENCOUNTER — Ambulatory Visit: Payer: BC Managed Care – PPO | Admitting: Physical Therapy

## 2021-05-06 ENCOUNTER — Other Ambulatory Visit: Payer: Self-pay

## 2021-05-06 DIAGNOSIS — G8929 Other chronic pain: Secondary | ICD-10-CM | POA: Diagnosis not present

## 2021-05-06 DIAGNOSIS — M6281 Muscle weakness (generalized): Secondary | ICD-10-CM | POA: Diagnosis not present

## 2021-05-06 DIAGNOSIS — M545 Low back pain, unspecified: Secondary | ICD-10-CM | POA: Diagnosis not present
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
## 2021-05-07 ENCOUNTER — Encounter: Payer: Self-pay | Admitting: Physical Therapy

## 2021-05-07 ENCOUNTER — Ambulatory Visit: Payer: BC Managed Care – PPO | Admitting: Family Medicine

## 2021-05-10 ENCOUNTER — Ambulatory Visit: Payer: BC Managed Care – PPO | Admitting: Physical Therapy

## 2021-05-10 ENCOUNTER — Encounter: Payer: Self-pay | Admitting: Physical Therapy

## 2021-05-10 ENCOUNTER — Other Ambulatory Visit: Payer: Self-pay

## 2021-05-10 DIAGNOSIS — M6281 Muscle weakness (generalized): Secondary | ICD-10-CM

## 2021-05-10 DIAGNOSIS — M545 Low back pain, unspecified: Secondary | ICD-10-CM | POA: Diagnosis not present

## 2021-05-10 DIAGNOSIS — G8929 Other chronic pain: Secondary | ICD-10-CM
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
## 2021-05-12 ENCOUNTER — Encounter: Payer: Self-pay | Admitting: Family Medicine

## 2021-05-12 ENCOUNTER — Other Ambulatory Visit: Payer: Self-pay

## 2021-05-12 ENCOUNTER — Ambulatory Visit: Payer: BC Managed Care – PPO | Admitting: Family Medicine

## 2021-05-12 ENCOUNTER — Ambulatory Visit: Payer: BC Managed Care – PPO | Admitting: Physical Therapy

## 2021-05-12 ENCOUNTER — Inpatient Hospital Stay (INDEPENDENT_AMBULATORY_CARE_PROVIDER_SITE_OTHER): Payer: BC Managed Care – PPO | Admitting: Radiology

## 2021-05-12 VITALS — BP 120/76 | HR 111 | Ht 69.0 in | Wt 247.0 lb

## 2021-05-12 DIAGNOSIS — G8929 Other chronic pain: Secondary | ICD-10-CM

## 2021-05-12 DIAGNOSIS — E669 Obesity, unspecified: Secondary | ICD-10-CM

## 2021-05-12 DIAGNOSIS — M533 Sacrococcygeal disorders, not elsewhere classified: Secondary | ICD-10-CM

## 2021-05-12 DIAGNOSIS — Z6836 Body mass index (BMI) 36.0-36.9, adult: Secondary | ICD-10-CM

## 2021-05-12 DIAGNOSIS — M47817 Spondylosis without myelopathy or radiculopathy, lumbosacral region: Secondary | ICD-10-CM

## 2021-05-12 DIAGNOSIS — F5101 Primary insomnia: Secondary | ICD-10-CM | POA: Diagnosis not present

## 2021-05-12 MED ORDER — TRIAMCINOLONE ACETONIDE 40 MG/ML IJ SUSP
120.0000 mg | Freq: Once | INTRAMUSCULAR | Status: AC
  Administered 2021-05-12: 120 mg

## 2021-05-12 NOTE — Assessment & Plan Note (Signed)
Patient has noted improvement following scheduled meloxicam and formal physical therapy which she still takes part in.  That being said she still has a baseline ache with focality to the lower left back.  Examination reveals focal tenderness at the SI joint.  Given her progress I have reviewed additional possible treatment strategies inclusive of ultrasound-guided injection, she is amenable to proceeding with this today.  We will perform this as a diagnostic and therapeutic procedure, if suboptimal response noted can consider advanced imaging or referral to spine for epidural/facet injection evaluation.  Postinjection care outlined, she is to continue meloxicam on an as-needed basis, physical therapy, and we will coordinate a follow-up.

## 2021-05-12 NOTE — Assessment & Plan Note (Addendum)
Chronic issue for which she had previously noted benefit from [place] pharmacotherapy, has since self discontinued this due to excessive sleepiness.  She had to reschedule previous appointment with ENT/sleep medicine, is amenable to following up with them again.  We will help coordinate this follow-up.  Medication management discussion and we have agreed upon discontinuation of Quviviq, this will be removed from her medication list.

## 2021-05-12 NOTE — Progress Notes (Signed)
Primary Care / Sports Medicine Office Visit  Patient Information:  Patient ID: Jamie Reeves, female DOB: 11-Dec-1970 Age: 50 y.o. MRN: [id number]   [place] Jamie Reeves is a pleasant 50 y.o. female presenting with the following:  Chief Complaint  Patient presents with   Follow-up   Spondylosis of lumbosacral    Following with PT twice weekly; taking meloxicam with relief; denies pain in office   Insomnia    No longer on Quviviq due to side effects of excessive drowsiness throughout the day; will reschedule with ENT    Patient Active Problem List   Diagnosis Date Noted   Chronic left SI joint pain 05/12/2021   Annual physical exam 03/11/2021   Class 2 obesity with body mass index (BMI) of 36.0 to 36.9 in adult 03/10/2021   Spondylosis of 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 or radiculopathy 03/10/2021   Primary insomnia 02/02/2021   Gastroesophageal reflux disease without esophagitis 02/02/2021   Anxiety 02/02/2021   Hyperlipidemia, mixed 10/22/2020   OSA (obstructive sleep apnea) 10/22/2020   SOBOE (shortness of breath on exertion) 10/22/2020    Vitals:   05/12/21 0911  BP: 120/76  Pulse: (!) 111  SpO2: 95%   Vitals:   05/12/21 0911  Weight: 247 lb (112 kg)  Height: 5\' 9"  (1.753 m)   Body mass index is 36.48 kg/m.  No results found.   Independent interpretation of notes and tests performed by another provider:   None  Procedures performed:   Procedure:  Injection of left sacroiliac joint under ultrasound guidance. Ultrasound guidance utilized for in plane approach to the left sacroiliac joint, joint space visualized as well as normoechoic tissue overlying the joint, needle visualized as well as hypoechoic response from injectate Samsung HS60 device utilized with permanent recording / reporting. Consent obtained and verified. Skin prepped in a sterile fashion. Ethyl chloride spray for topical local analgesia.  Completed without difficulty and tolerated  well. Medication: triamcinolone acetonide 40 mg/mL suspension for injection 3 mL total and 3 mL lidocaine 1% without epinephrine utilized for needle placement anesthetic Advised to contact for fevers/chills, erythema, induration, drainage, or persistent bleeding.   Pertinent History, Exam, Impression, and Recommendations:   Spondylosis of 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 or radiculopathy Patient has noted improvement following scheduled meloxicam and formal physical therapy which she still takes part in.  That being said she still has a baseline ache with focality to the lower left back.  Examination reveals focal tenderness at the SI joint.  Given her progress I have reviewed additional possible treatment strategies inclusive of ultrasound-guided injection, she is amenable to proceeding with this today.  We will perform this as a diagnostic and therapeutic procedure, if suboptimal response noted can consider advanced imaging or referral to spine for epidural/facet injection evaluation.  Postinjection care outlined, she is to continue meloxicam on an as-needed basis, physical therapy, and we will coordinate a follow-up.  Chronic left SI joint pain This is noted in the setting of chronic low back pain, this is a chronic issue as well, there is residual focal tenderness at her left SI joint despite consistent physical therapy and scheduled meloxicam.  Treatment strategies were outlined and she is amenable to proceeding with ultrasound-guided corticosteroid injection of the left SI joint.  Class 2 obesity with body mass index (BMI) of 36.0 to 36.9 in adult Patient is requesting further evaluation by bariatric surgeon, a referral was placed in this regard today.  Primary insomnia Chronic issue for which she had previously  noted benefit from [place] pharmacotherapy, has since self discontinued this due to excessive sleepiness.  She had to reschedule previous appointment with ENT/sleep medicine, is  amenable to following up with them again.  We will help coordinate this follow-up.  Medication management discussion and we have agreed upon discontinuation of Quviviq, this will be removed from her medication list.   Orders & Medications Meds ordered this encounter  Medications   triamcinolone acetonide (KENALOG-40) injection 120 mg   Orders Placed This Encounter  Procedures   [place] [facility]   Amb Referral to Bariatric Surgery     Return in about 2 months (around 07/13/2021).     Jerrol Banana, MD   Primary Care Sports Medicine [facility]

## 2021-05-12 NOTE — Patient Instructions (Addendum)
You have just been given a cortisone injection to reduce pain and inflammation. After the injection you may notice immediate relief of pain as a result of the Lidocaine. It is important to rest the area of the injection for 24 to 48 hours after the injection. There is a possibility of some temporary increased discomfort and swelling for up to 72 hours until the cortisone begins to work. If you do have pain, simply rest the joint and use ice. If you can tolerate over the counter medications, you can try Tylenol, Aleve, or Advil for added relief per package instructions. - Continue with physical therapy, home exercises - Can continue with meloxicam on an as-needed basis, take with food - Contact number below to coordinate follow-up with ENT - Return for follow-up in 2 months, contact us for questions between now and then  [facility]- [phone]

## 2021-05-12 NOTE — Assessment & Plan Note (Signed)
This is noted in the setting of chronic low back pain, this is a chronic issue as well, there is residual focal tenderness at her left SI joint despite consistent physical therapy and scheduled meloxicam.  Treatment strategies were outlined and she is amenable to proceeding with ultrasound-guided corticosteroid injection of the left SI joint.

## 2021-05-12 NOTE — Assessment & Plan Note (Signed)
Patient is requesting further evaluation by bariatric surgeon, a referral was placed in this regard today.

## 2021-05-13 ENCOUNTER — Ambulatory Visit: Payer: BC Managed Care – PPO | Admitting: Physical Therapy

## 2021-05-13 ENCOUNTER — Ambulatory Visit: Payer: BC Managed Care – PPO | Admitting: Family Medicine

## 2021-05-20 ENCOUNTER — Other Ambulatory Visit: Payer: Self-pay

## 2021-05-20 ENCOUNTER — Ambulatory Visit: Payer: BC Managed Care – PPO | Admitting: Physical Therapy

## 2021-05-20 DIAGNOSIS — G8929 Other chronic pain: Secondary | ICD-10-CM | POA: Diagnosis not present

## 2021-05-20 DIAGNOSIS — M6281 Muscle weakness (generalized): Secondary | ICD-10-CM | POA: Diagnosis not present

## 2021-05-20 DIAGNOSIS — M545 Low back pain, unspecified: Secondary | ICD-10-CM | POA: Diagnosis not present
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
## 2021-05-26 ENCOUNTER — Other Ambulatory Visit: Payer: Self-pay

## 2021-05-26 ENCOUNTER — Ambulatory Visit: Payer: BC Managed Care – PPO | Attending: Family Medicine | Admitting: Physical Therapy

## 2021-05-26 DIAGNOSIS — S134XXD Sprain of ligaments of cervical spine, subsequent encounter: Secondary | ICD-10-CM | POA: Insufficient documentation

## 2021-05-26 DIAGNOSIS — M542 Cervicalgia: Secondary | ICD-10-CM | POA: Diagnosis not present

## 2021-05-26 DIAGNOSIS — M546 Pain in thoracic spine: Secondary | ICD-10-CM | POA: Insufficient documentation

## 2021-05-26 DIAGNOSIS — M6281 Muscle weakness (generalized): Secondary | ICD-10-CM | POA: Diagnosis not present

## 2021-05-26 DIAGNOSIS — G8929 Other chronic pain: Secondary | ICD-10-CM | POA: Insufficient documentation

## 2021-05-26 DIAGNOSIS — M545 Low back pain, unspecified: Secondary | ICD-10-CM | POA: Diagnosis not present
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
## 2021-05-27 ENCOUNTER — Encounter: Payer: Self-pay | Admitting: Physical Therapy

## 2021-05-27 DIAGNOSIS — J301 Allergic rhinitis due to pollen: Secondary | ICD-10-CM | POA: Diagnosis not present

## 2021-05-27 DIAGNOSIS — G2581 Restless legs syndrome: Secondary | ICD-10-CM | POA: Diagnosis not present

## 2021-05-27 DIAGNOSIS — G4733 Obstructive sleep apnea (adult) (pediatric): Secondary | ICD-10-CM | POA: Diagnosis not present

## 2021-06-01 ENCOUNTER — Ambulatory Visit: Payer: BC Managed Care – PPO | Admitting: Physical Therapy

## 2021-06-01 ENCOUNTER — Other Ambulatory Visit: Payer: Self-pay

## 2021-06-01 DIAGNOSIS — M6281 Muscle weakness (generalized): Secondary | ICD-10-CM

## 2021-06-01 DIAGNOSIS — M542 Cervicalgia: Secondary | ICD-10-CM | POA: Diagnosis not present

## 2021-06-01 DIAGNOSIS — S134XXD Sprain of ligaments of cervical spine, subsequent encounter: Secondary | ICD-10-CM | POA: Diagnosis not present

## 2021-06-01 DIAGNOSIS — M545 Low back pain, unspecified: Secondary | ICD-10-CM | POA: Diagnosis not present

## 2021-06-01 DIAGNOSIS — M546 Pain in thoracic spine: Secondary | ICD-10-CM | POA: Diagnosis not present

## 2021-06-01 DIAGNOSIS — G8929 Other chronic pain: Secondary | ICD-10-CM
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
## 2021-06-02 ENCOUNTER — Encounter: Payer: Self-pay | Admitting: Physical Therapy

## 2021-06-02 NOTE — Patient Instructions (Incomplete)
***  mODI for progress note , functional lifting goal chair to table        TREATMENT     Therapeutic Activities   Re-assessment performed (see above and updated Goal section below)     Manual Therapy - for symptom modulation, soft tissue sensitivity and mobility, thoracic and lumbar spine accessory mobility   STM/DTM bilateral L4-S1 longissimus lumborum, gluteus medius and deep hip external rotators       Trigger Point Dry Needling (TDN) with electric stimulation, unbilled TDN performed to bilateral L3 and L4 multifidi with 0.25 x 75 single needle placements. Utilized electric stimulation today with needles placed at multifidi for analgesic/pain gating effect to allow for improved tolerance of home mobility program and progression of light isometric work. Setup 2 channels vertically along R and L multifidi (current running longitudinally). Frequency: 2 CPS, Intensity: 4 mA; Duration: 8 min     Therapeutic Exercise - repeated movement to modulate referred and axial low back pain and improve lumbar spine ROM, for improved soft tissue flexibility and extensibility as needed for ROM, graded loading and strengthening to reduce threat to loading lumbar paraspinals and lumbopelvic musculature     Repeated extension in lying (prone press up); 1x10   Lower trunk rotations; x10 ea dir, with QL bias, bilateral LE   Cat Camel; x10 ea dir, quadruped       *next visit* Quadruped sidebend; x10 ea dir  Bird dog; 1x10, tactile cueing and verbal cueing to maintain neutral spine position  Prone alternating hip extension; 2x10, alternating Child's pose with QL bias; x10, 5 sec on either side  Pallof press with Nautilus; x15 ea dir, 30 lbs   *not today* Repeated extension in standing; with patient overpressure; 1x10 Anterior pelvic tilt, hooklying; 2x10  [minimal verbal cueing and demonstration to learn movement] Bridge with neutral spine; 2x10, 2 sec at top Quadruped rock back; x10 Open  book; 1x10 ea side Dynamic hamstring stretch (sciatic glider technique); x 20 bilat LE Piriformis stretching; x 1 min, bilat           ASSESSMENT Patient has fair compliance with her HEP, but she has not yet initiated modification to repeated movement program discussed last visit. She does have increase in axial pain today with repeated extension in lying; however, she has centralized her symptoms with use of extension to date. Discussed at length expectations with repeated movement/MDT intervention and to stop movement if she feels that her pain across low back is significantly worsening. Pt exhibits minimal range of motion deficits at this time, though she does have moderate pain with extension and tightness with lumbar flexion and lateral flexion. Pt has met long-term FOTO goal. Pt has met long-term NPRS goal. Will assess functional lifting and mODI next visit. Pt has made notable progress to date and presents with improved strength and positive subjective appraisal of PT to date. Patient has remaining deficits in lumbar spine AROM, sensitivity of lower lumbar paraspinals and gluteal musculature/deep hip external rotators, postural changes (lower crossed presentation), decreased lower extremity posterior chain soft tissue extensibility, and low back pain with mostly axial symptoms at this time. Patient will benefit from continued skilled therapeutic intervention to address the above deficits as needed for improved function and QoL.

## 2021-06-03 ENCOUNTER — Ambulatory Visit: Payer: BC Managed Care – PPO | Admitting: Physical Therapy

## 2021-06-03 ENCOUNTER — Other Ambulatory Visit: Payer: Self-pay

## 2021-06-08 ENCOUNTER — Ambulatory Visit: Payer: BC Managed Care – PPO | Admitting: Family Medicine

## 2021-06-08 ENCOUNTER — Other Ambulatory Visit: Payer: Self-pay

## 2021-06-08 ENCOUNTER — Encounter: Payer: Self-pay | Admitting: Family Medicine

## 2021-06-08 ENCOUNTER — Ambulatory Visit: Payer: BC Managed Care – PPO | Admitting: Physical Therapy

## 2021-06-08 VITALS — BP 98/72 | HR 106 | Temp 98.0°F | Ht 69.0 in | Wt 249.0 lb

## 2021-06-08 DIAGNOSIS — R110 Nausea: Secondary | ICD-10-CM | POA: Diagnosis not present

## 2021-06-08 DIAGNOSIS — M545 Low back pain, unspecified: Secondary | ICD-10-CM

## 2021-06-08 DIAGNOSIS — J329 Chronic sinusitis, unspecified: Secondary | ICD-10-CM | POA: Insufficient documentation

## 2021-06-08 DIAGNOSIS — J029 Acute pharyngitis, unspecified: Secondary | ICD-10-CM

## 2021-06-08 DIAGNOSIS — M6281 Muscle weakness (generalized): Secondary | ICD-10-CM

## 2021-06-08 DIAGNOSIS — J310 Chronic rhinitis: Secondary | ICD-10-CM | POA: Insufficient documentation

## 2021-06-08 LAB — POCT INFLUENZA A/B
Influenza A, POC: NEGATIVE
Influenza B, POC: NEGATIVE

## 2021-06-08 LAB — POC COVID19 BINAXNOW: SARS Coronavirus 2 Ag: NEGATIVE

## 2021-06-08 MED ORDER — FLUNISOLIDE 25 MCG/ACT (0.025%) NA SOLN: 2.0000 | Freq: Two times a day (BID) | NASAL | 0 refills | Status: DC

## 2021-06-08 MED ORDER — ONDANSETRON 8 MG PO TBDP: 8.0000 mg | ORAL_TABLET | Freq: Three times a day (TID) | ORAL | 0 refills | Status: DC | PRN

## 2021-06-08 MED ORDER — AZITHROMYCIN 250 MG PO TABS: ORAL_TABLET | ORAL | 0 refills | Status: AC

## 2021-06-08 NOTE — Patient Instructions (Addendum)
-   Use Rx steroid nasal spray, 2 sprays in each nostril twice daily x7-10 days - (If Rx steroid nasal spray Flonase, 2 sprays in each nostril daily x7 next days) - Start over-the-counter antihistamine (Claritin, Zyrtec, etc.) x7-10 days - If symptoms fail to improve at the 7 next day mark, start antibiotics and take for full course - Gentle/bland diet until symptoms improve - Contact us for any symptoms that persist despite the above

## 2021-06-08 NOTE — Progress Notes (Signed)
Found ago, did he had been there just right now try to think yes, she should be coming out soon okay just not [facility]thia there is to be nursing there.    Primary Care / Sports Medicine Office Visit  Patient Information:  Patient ID: Jamie Reeves, female DOB: [birth year] Age: 51 y.o. MRN: [id number]   Jenna Grenier is a pleasant 51 y.o. female presenting with the following:  Chief Complaint  Patient presents with   Gastroesophageal Reflux    X1 week; nausea and abdominal discomfort associated   Sinus Problem    X3-4 days; right-sided sore throat; loss of taste    Vitals:   06/08/21 0955  BP: 98/72  Pulse: (!) 106  Temp: 98 F (36.7 C)  SpO2: 97%   Vitals:   06/08/21 0955  Weight: 249 lb (112.9 kg)  Height: 5\' 9"  (1.753 m)   Body mass index is 36.77 kg/m.  [place] [facility]  Result Date: 05/12/2021 Procedure:  Injection of left sacroiliac joint under ultrasound guidance. Ultrasound guidance utilized for in plane approach to the left sacroiliac joint, joint space visualized as well as normoechoic tissue overlying the joint, needle visualized as well as hypoechoic response from injectate Samsung HS60 device utilized with permanent recording / reporting. Consent obtained and verified. Skin prepped in a sterile fashion. Ethyl chloride spray for topical local analgesia. Completed without difficulty and tolerated well. Medication: triamcinolone acetonide 40 mg/mL suspension for injection 3 mL total and 3 mL lidocaine 1% without epinephrine utilized for needle placement anesthetic Advised to contact for fevers/chills, erythema, induration, drainage, or persistent bleeding.    Independent interpretation of notes and tests performed by another provider:   None  Procedures performed:   None  Pertinent History, Exam, Impression, and Recommendations:   Rhinitis Patient with roughly 1 week history of nausea, food related abdominal pain, softer stools that have been malodorous,  increased bloating.  Additionally, has noted 3-4 days of sore throat, sinus pressure, loss of taste.  She states that her fianc had a "minor cold" during this timeframe.  She denies any fevers, chills, chest pain, shortness of air, no cough, nasal secretions have been clear.  Denies any overt mucus in stools.  Examination significant for swollen and erythematous turbinates bilaterally, nontender sinuses, benign oropharynx and canal/TM bilaterally, clear lung fields throughout with benign heart sounds.  Abdomen is soft, normoactive bowel sounds, mildly tender right lower quadrant without rebound.  Clinical details and findings are most consistent with rhinitis, infectious etiology.  She was negative for flu, COVID in office today.  Increased mucus production can account for GI symptoms I have advised patient of the same as well as initiation of steroid intranasal spray, antihistamine, Zofran for nausea, bland diet, and initiation of antibiotics if symptoms fail to improve at the 7-10-day mark.  She was advised to contact us for symptoms that linger despite adherence to this plan.   Orders & Medications Meds ordered this encounter  Medications   flunisolide (NASALIDE) 25 MCG/ACT (0.025%) SOLN    Sig: Place 2 sprays into the nose 2 (two) times daily.    Dispense:  25 mL    Refill:  0   azithromycin (ZITHROMAX) 250 MG tablet    Sig: Take 2 tablets on day 1, then 1 tablet daily on days 2 through 5    Dispense:  6 tablet    Refill:  0   ondansetron (ZOFRAN-ODT) 8 MG disintegrating tablet    Sig: Take 1 tablet (8 mg total)  by mouth every 8 (eight) hours as needed for nausea.    Dispense:  20 tablet    Refill:  0   Orders Placed This Encounter  Procedures   POC COVID-19   POCT Influenza A/B     Return if symptoms worsen or fail to improve.     Montel Culver, MD   Primary Care Sports Medicine [place]

## 2021-06-08 NOTE — Patient Instructions (Incomplete)
OBJECTIVE FINDINGS     Palpation Tenderness to palpation along L4-S1 longissimus lumborum, bilateral PSIS   Strength (out of 5) R/L 5/5 Hip flexion 5/5 Hip adduction 4+/4+ Hip extension 5/5 Knee extension 5/5 Knee flexion 5/5 Ankle dorsiflexion 5/5 Ankle plantarflexion *Indicates pain   AROM (degrees) R/L (all movements include overpressure unless otherwise stated) Lumbar forward flexion (65): 75% (pain at lumbosacral region axially)  Lumbar extension (30): 75% (pain in axial lumbosacral region) Lumbar lateral flexion (25): R: 100% (pull opposite flank) L: 100% (pull opposite flank)   Thoracic and Lumbar rotation (30 degrees):  R: WNL L: WNL Hip IR (0-45): R: WNL L: WNL Hip ER (0-45): R: WNL L: WNL Hip Flexion (0-125): R: WNL, L: WNL *Indicates pain     Repeated Movements Repeated extension in lying: no effect during, increase in axial pain after      Muscle Length Hamstrings: R: lacking 20 degrees L: lacking 20 degrees             TREATMENT     Therapeutic Activities   Re-assessment performed (see above and updated Goal section below)     Manual Therapy - for symptom modulation, soft tissue sensitivity and mobility, thoracic and lumbar spine accessory mobility   STM/DTM bilateral L4-S1 longissimus lumborum, gluteus medius and deep hip external rotators       Trigger Point Dry Needling (TDN) with electric stimulation, unbilled TDN performed to bilateral L3 and L4 multifidi with 0.25 x 75 single needle placements. Utilized electric stimulation today with needles placed at multifidi for analgesic/pain gating effect to allow for improved tolerance of home mobility program and progression of light isometric work. Setup 2 channels vertically along R and L multifidi (current running longitudinally). Frequency: 2 CPS, Intensity: 4 mA; Duration: 8 min     Therapeutic Exercise - repeated movement to modulate referred and axial low back pain and improve lumbar  spine ROM, for improved soft tissue flexibility and extensibility as needed for ROM, graded loading and strengthening to reduce threat to loading lumbar paraspinals and lumbopelvic musculature     Repeated extension in lying (prone press up); 1x10   Lower trunk rotations; x10 ea dir, with QL bias, bilateral LE   Cat Camel; x10 ea dir, quadruped       *next visit* Quadruped sidebend; x10 ea dir  Bird dog; 1x10, tactile cueing and verbal cueing to maintain neutral spine position  Prone alternating hip extension; 2x10, alternating Child's pose with QL bias; x10, 5 sec on either side  Pallof press with Nautilus; x15 ea dir, 30 lbs   *not today* Repeated extension in standing; with patient overpressure; 1x10 Anterior pelvic tilt, hooklying; 2x10  [minimal verbal cueing and demonstration to learn movement] Bridge with neutral spine; 2x10, 2 sec at top Quadruped rock back; x10 Open book; 1x10 ea side Dynamic hamstring stretch (sciatic glider technique); x 20 bilat LE Piriformis stretching; x 1 min, bilat           ASSESSMENT Patient has fair compliance with her HEP, but she has not yet initiated modification to repeated movement program discussed last visit. She does have increase in axial pain today with repeated extension in lying; however, she has centralized her symptoms with use of extension to date. Discussed at length expectations with repeated movement/MDT intervention and to stop movement if she feels that her pain across low back is significantly worsening. Pt exhibits minimal range of motion deficits at this time, though she does have moderate  pain with extension and tightness with lumbar flexion and lateral flexion. Pt has met long-term FOTO goal. Pt has met long-term NPRS goal. Will assess functional lifting and mODI next visit. Pt has made notable progress to date and presents with improved strength and positive subjective appraisal of PT to date. Patient has remaining deficits  in lumbar spine AROM, sensitivity of lower lumbar paraspinals and gluteal musculature/deep hip external rotators, postural changes (lower crossed presentation), decreased lower extremity posterior chain soft tissue extensibility, and low back pain with mostly axial symptoms at this time. Patient will benefit from continued skilled therapeutic intervention to address the above deficits as needed for improved function and QoL.

## 2021-06-08 NOTE — Assessment & Plan Note (Signed)
Patient with roughly 1 week history of nausea, food related abdominal pain, softer stools that have been malodorous, increased bloating.  Additionally, has noted 3-4 days of sore throat, sinus pressure, loss of taste.  She states that her fianc had a "minor cold" during this timeframe.  She denies any fevers, chills, chest pain, shortness of air, no cough, nasal secretions have been clear.  Denies any overt mucus in stools.  Examination significant for swollen and erythematous turbinates bilaterally, nontender sinuses, benign oropharynx and canal/TM bilaterally, clear lung fields throughout with benign heart sounds.  Abdomen is soft, normoactive bowel sounds, mildly tender right lower quadrant without rebound.  Clinical details and findings are most consistent with rhinitis, infectious etiology.  She was negative for flu, COVID in office today.  Increased mucus production can account for GI symptoms I have advised patient of the same as well as initiation of steroid intranasal spray, antihistamine, Zofran for nausea, bland diet, and initiation of antibiotics if symptoms fail to improve at the 7-10-day mark.  She was advised to contact us for symptoms that linger despite adherence to this plan.

## 2021-06-10 ENCOUNTER — Encounter: Payer: Self-pay | Admitting: Physical Therapy

## 2021-06-10 ENCOUNTER — Ambulatory Visit: Payer: BC Managed Care – PPO | Admitting: Physical Therapy

## 2021-06-10 ENCOUNTER — Other Ambulatory Visit: Payer: Self-pay

## 2021-06-10 DIAGNOSIS — M545 Low back pain, unspecified: Secondary | ICD-10-CM | POA: Diagnosis not present

## 2021-06-10 DIAGNOSIS — M546 Pain in thoracic spine: Secondary | ICD-10-CM | POA: Diagnosis not present

## 2021-06-10 DIAGNOSIS — G8929 Other chronic pain: Secondary | ICD-10-CM | POA: Diagnosis not present

## 2021-06-10 DIAGNOSIS — S134XXD Sprain of ligaments of cervical spine, subsequent encounter: Secondary | ICD-10-CM | POA: Diagnosis not present

## 2021-06-10 DIAGNOSIS — M6281 Muscle weakness (generalized): Secondary | ICD-10-CM

## 2021-06-10 DIAGNOSIS — M542 Cervicalgia: Secondary | ICD-10-CM | POA: Diagnosis not present
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
## 2021-06-11 ENCOUNTER — Other Ambulatory Visit: Payer: Self-pay | Admitting: Family Medicine

## 2021-06-11 ENCOUNTER — Telehealth: Payer: Self-pay

## 2021-06-11 DIAGNOSIS — J310 Chronic rhinitis: Secondary | ICD-10-CM

## 2021-06-11 MED ORDER — BECLOMETHASONE DIPROPIONATE 80 MCG/ACT NA AERS: 2.0000 | INHALATION_SPRAY | Freq: Every day | NASAL | 1 refills | Status: DC

## 2021-06-11 NOTE — Telephone Encounter (Signed)
Noted  

## 2021-06-11 NOTE — Telephone Encounter (Signed)
Alternate (Qnasl generic) sent to pharmacy, if there are any coverage issues she can hopefully find out from the pharmacy what equivalent is covered and we can send that Rx in accordingly.

## 2021-06-11 NOTE — Telephone Encounter (Signed)
Patient's insurance will not authorize Flunisolide solution.  Please advise for alternative if applicable.

## 2021-06-14 ENCOUNTER — Ambulatory Visit
Admission: EM | Admit: 2021-06-14 | Discharge: 2021-06-14 | Disposition: A | Discharge status: Home / Self Care | Payer: BC Managed Care – PPO | Attending: Emergency Medicine | Admitting: Emergency Medicine

## 2021-06-14 ENCOUNTER — Other Ambulatory Visit: Payer: Self-pay

## 2021-06-14 DIAGNOSIS — M545 Low back pain, unspecified: Secondary | ICD-10-CM | POA: Diagnosis not present

## 2021-06-14 DIAGNOSIS — S7012XA Contusion of left thigh, initial encounter: Secondary | ICD-10-CM | POA: Diagnosis not present

## 2021-06-14 DIAGNOSIS — S161XXA Strain of muscle, fascia and tendon at neck level, initial encounter: Secondary | ICD-10-CM

## 2021-06-14 MED ORDER — BACLOFEN 10 MG PO TABS: 10.0000 mg | ORAL_TABLET | Freq: Three times a day (TID) | ORAL | 0 refills | Status: DC

## 2021-06-14 MED ORDER — IBUPROFEN 600 MG PO TABS: 600.0000 mg | ORAL_TABLET | Freq: Four times a day (QID) | ORAL | 0 refills | Status: DC | PRN
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
## 2021-06-14 NOTE — Discharge Instructions (Signed)
Take the ibuprofen, 600 mg every 6 hours with food, on a schedule for the next 48 hours and then as needed.  Take the baclofen, 10 mg every 8 hours, on a schedule for the next 48 hours and then as needed.  Apply moist heat to your back for 30 minutes at a time 2-3 times a day to improve blood flow to the area and help remove the lactic acid causing the spasm.  Follow the back/neck, thigh exercises given at discharge.  Return for reevaluation for any new or worsening symptoms.

## 2021-06-15 ENCOUNTER — Ambulatory Visit: Payer: BC Managed Care – PPO | Admitting: Family Medicine

## 2021-06-15 ENCOUNTER — Ambulatory Visit: Payer: BC Managed Care – PPO | Admitting: Physical Therapy

## 2021-06-15 ENCOUNTER — Encounter: Payer: Self-pay | Admitting: Physical Therapy

## 2021-06-15 ENCOUNTER — Encounter: Payer: Self-pay | Admitting: Family Medicine

## 2021-06-15 VITALS — BP 118/80 | HR 112 | Ht 69.0 in | Wt 252.0 lb

## 2021-06-15 DIAGNOSIS — S134XXA Sprain of ligaments of cervical spine, initial encounter: Secondary | ICD-10-CM | POA: Insufficient documentation

## 2021-06-15 DIAGNOSIS — S134XXD Sprain of ligaments of cervical spine, subsequent encounter: Secondary | ICD-10-CM

## 2021-06-15 DIAGNOSIS — M6281 Muscle weakness (generalized): Secondary | ICD-10-CM | POA: Diagnosis not present

## 2021-06-15 DIAGNOSIS — M545 Low back pain, unspecified: Secondary | ICD-10-CM | POA: Diagnosis not present

## 2021-06-15 DIAGNOSIS — G8929 Other chronic pain: Secondary | ICD-10-CM | POA: Diagnosis not present

## 2021-06-15 DIAGNOSIS — M546 Pain in thoracic spine: Secondary | ICD-10-CM | POA: Diagnosis not present

## 2021-06-15 DIAGNOSIS — M542 Cervicalgia: Secondary | ICD-10-CM | POA: Diagnosis not present

## 2021-06-15 DIAGNOSIS — M4727 Other spondylosis with radiculopathy, lumbosacral region: Secondary | ICD-10-CM | POA: Diagnosis not present

## 2021-06-15 MED ORDER — CYCLOBENZAPRINE HCL 10 MG PO TABS: 10.0000 mg | ORAL_TABLET | Freq: Three times a day (TID) | ORAL | 1 refills | Status: DC | PRN

## 2021-06-15 MED ORDER — MELOXICAM 15 MG PO TABS: 15.0000 mg | ORAL_TABLET | Freq: Every day | ORAL | 0 refills | Status: DC | PRN
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
## 2021-06-15 NOTE — Patient Instructions (Signed)
-   Stop ibuprofen and baclofen - Start meloxicam, dose daily with food x2 weeks, after 2 weeks dose daily on an as-needed basis - Dose nightly cyclobenzaprine (muscle relaxer), side effect can be drowsiness; additional doses of cyclobenzaprine can be every 8 hours on an as-needed basis - Continue with physical therapy, new referral provided so that they can work on entire spine - Can perform moist heat (steam/shower) for additional pain control, maintain gentle motion throughout the day to avoid excessive stiffness - Contact our office for any lingering symptoms at or beyond the 4-week mark
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
## 2021-06-17 ENCOUNTER — Other Ambulatory Visit: Payer: Self-pay

## 2021-06-17 ENCOUNTER — Ambulatory Visit: Payer: BC Managed Care – PPO | Admitting: Physical Therapy

## 2021-06-17 DIAGNOSIS — M545 Low back pain, unspecified: Secondary | ICD-10-CM

## 2021-06-17 DIAGNOSIS — M6281 Muscle weakness (generalized): Secondary | ICD-10-CM | POA: Diagnosis not present

## 2021-06-17 DIAGNOSIS — M546 Pain in thoracic spine: Secondary | ICD-10-CM

## 2021-06-17 DIAGNOSIS — M542 Cervicalgia: Secondary | ICD-10-CM | POA: Diagnosis not present

## 2021-06-17 DIAGNOSIS — G8929 Other chronic pain: Secondary | ICD-10-CM | POA: Diagnosis not present

## 2021-06-17 DIAGNOSIS — S134XXD Sprain of ligaments of cervical spine, subsequent encounter: Secondary | ICD-10-CM | POA: Diagnosis not present
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
## 2021-06-22 ENCOUNTER — Ambulatory Visit: Payer: BC Managed Care – PPO | Admitting: Physical Therapy

## 2021-06-22 ENCOUNTER — Other Ambulatory Visit: Payer: Self-pay

## 2021-06-22 DIAGNOSIS — M545 Low back pain, unspecified: Secondary | ICD-10-CM

## 2021-06-22 DIAGNOSIS — S134XXD Sprain of ligaments of cervical spine, subsequent encounter: Secondary | ICD-10-CM | POA: Diagnosis not present

## 2021-06-22 DIAGNOSIS — M546 Pain in thoracic spine: Secondary | ICD-10-CM

## 2021-06-22 DIAGNOSIS — M542 Cervicalgia: Secondary | ICD-10-CM

## 2021-06-22 DIAGNOSIS — G8929 Other chronic pain: Secondary | ICD-10-CM | POA: Diagnosis not present

## 2021-06-22 DIAGNOSIS — M6281 Muscle weakness (generalized): Secondary | ICD-10-CM | POA: Diagnosis not present
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
## 2021-06-23 ENCOUNTER — Encounter: Payer: Self-pay | Admitting: Family Medicine

## 2021-06-24 ENCOUNTER — Other Ambulatory Visit: Payer: Self-pay

## 2021-06-24 ENCOUNTER — Ambulatory Visit: Payer: BC Managed Care – PPO | Attending: Family Medicine | Admitting: Physical Therapy

## 2021-06-24 DIAGNOSIS — S134XXD Sprain of ligaments of cervical spine, subsequent encounter: Secondary | ICD-10-CM

## 2021-06-24 DIAGNOSIS — M545 Low back pain, unspecified: Secondary | ICD-10-CM | POA: Diagnosis not present

## 2021-06-24 DIAGNOSIS — M542 Cervicalgia: Secondary | ICD-10-CM | POA: Diagnosis not present

## 2021-06-24 DIAGNOSIS — M546 Pain in thoracic spine: Secondary | ICD-10-CM
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
## 2021-06-26 ENCOUNTER — Encounter: Payer: Self-pay | Admitting: Physical Therapy

## 2021-06-29 ENCOUNTER — Other Ambulatory Visit: Payer: Self-pay

## 2021-06-29 ENCOUNTER — Encounter: Payer: Self-pay | Admitting: Physical Therapy

## 2021-06-29 ENCOUNTER — Ambulatory Visit: Payer: BC Managed Care – PPO | Admitting: Physical Therapy

## 2021-06-29 DIAGNOSIS — M546 Pain in thoracic spine: Secondary | ICD-10-CM

## 2021-06-29 DIAGNOSIS — M545 Low back pain, unspecified: Secondary | ICD-10-CM

## 2021-06-29 DIAGNOSIS — S134XXD Sprain of ligaments of cervical spine, subsequent encounter: Secondary | ICD-10-CM

## 2021-06-29 DIAGNOSIS — M542 Cervicalgia: Secondary | ICD-10-CM
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
## 2021-06-30 ENCOUNTER — Encounter: Payer: Self-pay | Admitting: Family Medicine

## 2021-07-01 ENCOUNTER — Ambulatory Visit: Payer: BC Managed Care – PPO | Admitting: Physical Therapy

## 2021-07-01 ENCOUNTER — Other Ambulatory Visit: Payer: Self-pay

## 2021-07-01 DIAGNOSIS — M542 Cervicalgia: Secondary | ICD-10-CM | POA: Diagnosis not present

## 2021-07-01 DIAGNOSIS — M546 Pain in thoracic spine: Secondary | ICD-10-CM | POA: Diagnosis not present

## 2021-07-01 DIAGNOSIS — S134XXD Sprain of ligaments of cervical spine, subsequent encounter: Secondary | ICD-10-CM

## 2021-07-01 DIAGNOSIS — M545 Low back pain, unspecified: Secondary | ICD-10-CM | POA: Diagnosis not present
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
## 2021-07-03 ENCOUNTER — Encounter: Payer: Self-pay | Admitting: Physical Therapy

## 2021-07-06 ENCOUNTER — Ambulatory Visit: Payer: BC Managed Care – PPO

## 2021-07-06 ENCOUNTER — Other Ambulatory Visit: Payer: Self-pay

## 2021-07-06 DIAGNOSIS — S134XXD Sprain of ligaments of cervical spine, subsequent encounter: Secondary | ICD-10-CM | POA: Diagnosis not present

## 2021-07-06 DIAGNOSIS — M545 Low back pain, unspecified: Secondary | ICD-10-CM | POA: Diagnosis not present

## 2021-07-06 DIAGNOSIS — M546 Pain in thoracic spine: Secondary | ICD-10-CM | POA: Diagnosis not present

## 2021-07-06 DIAGNOSIS — M542 Cervicalgia: Secondary | ICD-10-CM | POA: Diagnosis not present
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
## 2021-07-07 ENCOUNTER — Other Ambulatory Visit: Payer: Self-pay

## 2021-07-07 DIAGNOSIS — Z6836 Body mass index (BMI) 36.0-36.9, adult: Secondary | ICD-10-CM

## 2021-07-07 DIAGNOSIS — E669 Obesity, unspecified: Secondary | ICD-10-CM

## 2021-07-07 NOTE — Telephone Encounter (Signed)
Reached out to Jamie Reeves to see if patient can be referred to see someone sooner.

## 2021-07-08 ENCOUNTER — Encounter: Payer: BC Managed Care – PPO | Admitting: Physical Therapy

## 2021-07-10 ENCOUNTER — Other Ambulatory Visit: Payer: Self-pay | Admitting: Family Medicine

## 2021-07-10 DIAGNOSIS — S134XXA Sprain of ligaments of cervical spine, initial encounter: Secondary | ICD-10-CM

## 2021-07-10 DIAGNOSIS — M4727 Other spondylosis with radiculopathy, lumbosacral region: Secondary | ICD-10-CM
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
## 2021-07-13 ENCOUNTER — Other Ambulatory Visit: Payer: Self-pay

## 2021-07-13 ENCOUNTER — Encounter: Payer: Self-pay | Admitting: Family Medicine

## 2021-07-13 ENCOUNTER — Ambulatory Visit: Payer: BC Managed Care – PPO | Admitting: Family Medicine

## 2021-07-13 VITALS — BP 122/72 | HR 99 | Ht 69.0 in | Wt 256.0 lb

## 2021-07-13 DIAGNOSIS — S134XXD Sprain of ligaments of cervical spine, subsequent encounter: Secondary | ICD-10-CM | POA: Diagnosis not present

## 2021-07-13 DIAGNOSIS — G4733 Obstructive sleep apnea (adult) (pediatric): Secondary | ICD-10-CM

## 2021-07-13 DIAGNOSIS — F5101 Primary insomnia: Secondary | ICD-10-CM

## 2021-07-13 DIAGNOSIS — Z720 Tobacco use: Secondary | ICD-10-CM | POA: Diagnosis not present

## 2021-07-13 MED ORDER — BUPROPION HCL ER (SR) 150 MG PO TB12: 150.0000 mg | ORAL_TABLET | Freq: Two times a day (BID) | ORAL | 2 refills | Status: DC

## 2021-07-13 NOTE — Assessment & Plan Note (Signed)
See additional assessment(s) for plan details. 

## 2021-07-13 NOTE — Assessment & Plan Note (Signed)
Patient has been doing well with physical therapy, feels that she is progressing nicely, has a few sessions left.  We can follow-up on an as-needed basis for this issue.  If persistent symptomatology noted, imaging to be coordinated.

## 2021-07-13 NOTE — Patient Instructions (Addendum)
-   Start Wellbutrin SR 150 mg daily x3 days, day 4 dose this medication twice daily until follow-up - Contact ENT/sleep medicine to discuss insomnia, sleep apnea, and CPAP - Complete online patient application with information below - Return for follow-up in 4 weeks  [facility] New Patient Application:  [email or web address]  Dr. Charlton Amor  Phone: [phone]

## 2021-07-13 NOTE — Assessment & Plan Note (Signed)
Patient is still citing issues with sleep, reports only using CPAP for few hours, is amenable to follow-up with ENT/sleep medicine to discuss methods to improve compliance.

## 2021-07-13 NOTE — Assessment & Plan Note (Signed)
Longstanding smoker/vape pen user.  She is interested in quitting, had modest response from bupropion and has been on Wellbutrin in the distant past but cannot recall how she did with that medication.  We spent 4 minutes discussing the process for tobacco cessation, pharmacologic and nonpharmacologic methods, importance of setting a quit date, addressing both behavioral and physiologic components.  She is amenable to start Wellbutrin SR at 150 mg daily x3 days then advance to 50 mg twice daily with a planned quit date and week 2.  I have the patient return 1 month for follow-up.  Chronic condition, ongoing, Rx management
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
## 2021-07-15 ENCOUNTER — Other Ambulatory Visit: Payer: Self-pay

## 2021-07-15 ENCOUNTER — Encounter: Payer: Self-pay | Admitting: Physical Therapy

## 2021-07-15 ENCOUNTER — Ambulatory Visit: Payer: BC Managed Care – PPO

## 2021-07-15 DIAGNOSIS — M545 Low back pain, unspecified: Secondary | ICD-10-CM

## 2021-07-15 DIAGNOSIS — M542 Cervicalgia: Secondary | ICD-10-CM

## 2021-07-15 DIAGNOSIS — M546 Pain in thoracic spine: Secondary | ICD-10-CM

## 2021-07-15 DIAGNOSIS — S134XXD Sprain of ligaments of cervical spine, subsequent encounter: Secondary | ICD-10-CM
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
## 2021-07-20 ENCOUNTER — Ambulatory Visit: Payer: BC Managed Care – PPO | Admitting: Physical Therapy

## 2021-07-20 NOTE — Patient Instructions (Incomplete)
°  °  TREATMENT   MHP (unbilled) utilized during manual therapy for analgesic effect and improved soft tissue extensibility, along upper traps and thoracolumbar spine in prone lying; x 5 minutes - NOT TODAY   Manual Therapy - for symptom modulation, soft tissue sensitivity and mobility as needed for improved tolerance of truncal ROM   Gentle manual cervical distraction, intermittent; 10 sec on, 10 sec off; x 5 minutes  STM/DTM/TPR: L>R UT, L>R C3-C6 splenius cervicis/capitis- not today STM and IASTM with Hypervolt L>R L1-L5 longissimus lumborum and L>R middle trap/rhomboid mm DTM/TPR L piriformis/gemelli- not today DTM/TPR: thoracolumbar erector spinae b/l CT junction lateral glide mob/thoracic T4-T8 CPA: gr II/III: 5 min ea     *not today* Supine long-leg bilateral distraction with Mulligan belt; 10 sec on, 10 sec off; x 5 minutes     Therapeutic Exercise - for improved soft tissue flexibility and extensibility as needed for ROM, graded movement to decrease threat to activity/active motion and for gradual restoration of neck and trunk motion, repeated movement for symptom modulation and to improve ROM   Review of ROM baselines (see above)  Sidelying thoracic rotation: x10 ea Cat Camel in quadruped; x10 Figure-4 stretch; push and pull; x1 minute on each LE Supine deep flexor nod; 2x10 Hooklying anterior>posterior pelvic tilt; 2x10 Bridge, supine; 1x10     *not today* Lower trunk rotations; x10 ea dir Upper trapezius stretch; 2x30 sec, bilateral Seated hip abduction and adduction isometrics; x20, 5 sec with belt/ball Repeated extension in lying; x10 Seated scapular clocks; 20x posterior Supine cervical spine rotation, within pt tolerance; x10 alternating L/R

## 2021-07-22 ENCOUNTER — Ambulatory Visit: Payer: BC Managed Care – PPO | Admitting: Physical Therapy

## 2021-07-22 NOTE — Patient Instructions (Incomplete)
?  ?  TREATMENT ?  ?MHP (unbilled) utilized during manual therapy for analgesic effect and improved soft tissue extensibility, along upper traps and thoracolumbar spine in prone lying; x 5 minutes - NOT TODAY ?  ?Manual Therapy - for symptom modulation, soft tissue sensitivity and mobility as needed for improved tolerance of truncal ROM ?  ?Gentle manual cervical distraction, intermittent; 10 sec on, 10 sec off; x 5 minutes  ?STM/DTM/TPR: L>R UT, L>R C3-C6 splenius cervicis/capitis- not today ?STM and IASTM with Hypervolt L>R L1-L5 longissimus lumborum and L>R middle trap/rhomboid mm ?DTM/TPR L piriformis/gemelli- not today ?DTM/TPR: thoracolumbar erector spinae b/l ?CT junction lateral glide mob/thoracic T4-T8 CPA: gr II/III: 5 min ea ?  ?  ?*not today* ?Supine long-leg bilateral distraction with Mulligan belt; 10 sec on, 10 sec off; x 5 minutes ?  ?  ?Therapeutic Exercise - for improved soft tissue flexibility and extensibility as needed for ROM, graded movement to decrease threat to activity/active motion and for gradual restoration of neck and trunk motion, repeated movement for symptom modulation and to improve ROM ?  ?Review of ROM baselines (see above)  ?Sidelying thoracic rotation: x10 ea ?Cat Camel in quadruped; x10 ?Figure-4 stretch; push and pull; x1 minute on each LE ?Supine deep flexor nod; 2x10 ?Hooklying anterior>posterior pelvic tilt; 2x10 ?Bridge, supine; 1x10 ?  ?  ?*not today* ?  ?Lower trunk rotations; x10 ea dir ?Upper trapezius stretch; 2x30 sec, bilateral ?Seated hip abduction and adduction isometrics; x20, 5 sec with belt/ball ?Repeated extension in lying; x10 ?Seated scapular clocks; 20x posterior ?Supine cervical spine rotation, within pt tolerance; x10 alternating L/R  ?  ?  ?  ?  ?

## 2021-07-27 ENCOUNTER — Ambulatory Visit: Payer: BC Managed Care – PPO | Attending: Family Medicine | Admitting: Physical Therapy

## 2021-07-27 ENCOUNTER — Ambulatory Visit: Payer: BC Managed Care – PPO | Admitting: Family Medicine

## 2021-07-27 ENCOUNTER — Other Ambulatory Visit: Payer: Self-pay

## 2021-07-27 DIAGNOSIS — S134XXD Sprain of ligaments of cervical spine, subsequent encounter: Secondary | ICD-10-CM | POA: Insufficient documentation

## 2021-07-27 DIAGNOSIS — M545 Low back pain, unspecified: Secondary | ICD-10-CM | POA: Diagnosis not present

## 2021-07-27 DIAGNOSIS — M542 Cervicalgia: Secondary | ICD-10-CM | POA: Insufficient documentation

## 2021-07-27 DIAGNOSIS — M546 Pain in thoracic spine: Secondary | ICD-10-CM | POA: Diagnosis not present
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
## 2021-07-29 ENCOUNTER — Encounter: Payer: BC Managed Care – PPO | Admitting: Physical Therapy

## 2021-07-29 ENCOUNTER — Encounter: Payer: Self-pay | Admitting: Physical Therapy

## 2021-08-01 ENCOUNTER — Encounter: Payer: Self-pay | Admitting: Family Medicine

## 2021-08-02 DIAGNOSIS — G2581 Restless legs syndrome: Secondary | ICD-10-CM | POA: Diagnosis not present

## 2021-08-02 DIAGNOSIS — G4733 Obstructive sleep apnea (adult) (pediatric): Secondary | ICD-10-CM | POA: Diagnosis not present

## 2021-08-03 ENCOUNTER — Other Ambulatory Visit: Payer: Self-pay

## 2021-08-03 ENCOUNTER — Ambulatory Visit: Payer: BC Managed Care – PPO | Admitting: Physical Therapy

## 2021-08-03 DIAGNOSIS — M545 Low back pain, unspecified: Secondary | ICD-10-CM | POA: Diagnosis not present

## 2021-08-03 DIAGNOSIS — M546 Pain in thoracic spine: Secondary | ICD-10-CM | POA: Diagnosis not present

## 2021-08-03 DIAGNOSIS — S134XXD Sprain of ligaments of cervical spine, subsequent encounter: Secondary | ICD-10-CM | POA: Diagnosis not present

## 2021-08-03 DIAGNOSIS — M542 Cervicalgia: Secondary | ICD-10-CM
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
## 2021-08-04 ENCOUNTER — Encounter: Payer: Self-pay | Admitting: Physical Therapy

## 2021-08-05 ENCOUNTER — Ambulatory Visit: Payer: BC Managed Care – PPO | Admitting: Family Medicine

## 2021-08-05 ENCOUNTER — Ambulatory Visit: Payer: BC Managed Care – PPO | Admitting: Physical Therapy

## 2021-08-05 ENCOUNTER — Encounter: Payer: Self-pay | Admitting: Family Medicine

## 2021-08-05 ENCOUNTER — Other Ambulatory Visit: Payer: Self-pay

## 2021-08-05 VITALS — BP 118/84 | HR 107 | Ht 69.0 in | Wt 262.0 lb

## 2021-08-05 DIAGNOSIS — Z720 Tobacco use: Secondary | ICD-10-CM

## 2021-08-05 DIAGNOSIS — K922 Gastrointestinal hemorrhage, unspecified: Secondary | ICD-10-CM

## 2021-08-05 DIAGNOSIS — M4727 Other spondylosis with radiculopathy, lumbosacral region: Secondary | ICD-10-CM | POA: Diagnosis not present

## 2021-08-05 DIAGNOSIS — K219 Gastro-esophageal reflux disease without esophagitis: Secondary | ICD-10-CM | POA: Diagnosis not present

## 2021-08-05 MED ORDER — PANTOPRAZOLE SODIUM 40 MG PO TBEC: 40.0000 mg | DELAYED_RELEASE_TABLET | Freq: Every day | ORAL | 3 refills | Status: DC

## 2021-08-05 NOTE — Patient Instructions (Addendum)
-   Transition omeprazole to pantoprazole, dose daily on empty stomach until you see GI ?- Start bland GI diet, advance slowly as stomach symptoms allow ?- Stop NSAIDs (meloxicam, ibuprofen, naproxen, etc.) ?- Can dose Tylenol (acetaminophen) sparingly for pain ?- Referral coordinator will contact you in regards to both GI and pain/spine group referrals ?- Continue Wellbutrin, work towards quit date with smoking ?- Return for follow-up in 2 months ?

## 2021-08-05 NOTE — Assessment & Plan Note (Signed)
Patient with 1 week prior episode of 2-3 bowel movements with dark-colored stool, progressively improving, normal caliber.  This is in the setting of 2-3 months of intermittent stomach pain and nausea.  Of note, she has been dosing regular NSAIDs due to comorbid chronic lumbosacral pain.  She states that her bowels have returned to normal but she continues to have intermittent colicky abdominal pain aggravated by heavy foods. ? ?From examination standpoint, her abdomen is soft, mildly diffusely tender without rebound, is nondistended, no hepatosplenomegaly noted, normoactive bowel sounds are appreciated. ? ?I have advised transition from omeprazole to pantoprazole, discontinuation of all NSAIDs, sparing use of acetaminophen, bland GI diet, and urgent referral to gastroenterology for further evaluation and management. ?

## 2021-08-05 NOTE — Assessment & Plan Note (Signed)
Chronic condition previously well controlled with regular meloxicam usage, unfortunate she has noted worsened abdominal pain and concern for upper GI bleed bout.  I have advised her to discontinue NSAIDs, follow-up with GI, and referral to interventional pain/spine has been placed for additional management options in light of her relative intolerance to NSAIDs. ?

## 2021-08-05 NOTE — Assessment & Plan Note (Signed)
This is a chronic condition that is currently uncontrolled in the setting of concern for upper GI of bleed resulting in dark blood in stool.  I have advised a bland diet, discontinuation of NSAIDs, transition to pantoprazole. ? ?Chronic condition, exacerbated, Rx management ?

## 2021-08-05 NOTE — Assessment & Plan Note (Signed)
Patient states that she has been tolerating Wellbutrin well without adverse effects, has been able to decrease the amount/frequency of her vape pen use and had initially scheduled a quit date for this week however due to the increased stress she has noted following GI issues, she has continued to smoke.  At this stage have advised her to continue to work towards setting a quit date and ultimately quitting, will continue Wellbutrin until follow-up in 2 months. ?

## 2021-08-05 NOTE — Patient Instructions (Incomplete)
?  TREATMENT ?  ?  ?  ?Manual Therapy - for symptom modulation, soft tissue sensitivity and mobility as needed for improved tolerance of truncal ROM ?  ?Gentle manual cervical distraction, intermittent; 10 sec on, 10 sec off; x 5 minutes  ?STM/DTM/TPR: L>R UT, L>R C3-C6 splenius cervicis/capitis ?  ?  ?  ?*not today* ?CPA gr I-II L3-5 ?DTM/TPR: thoracolumbar erector spinae b/l ?DTM/TPR L piriformis/gemelli ?STM and IASTM with Hypervolt L>R L1-L5 longissimus lumborum and L>R middle trap/rhomboid mm ?Supine long-leg bilateral distraction with Mulligan belt; 10 sec on, 10 sec off; x 5 minutes ?CT junction lateral glide mob/thoracic T4-T8 CPA: gr II/III: 5 min ea ?  ?  ?  ?  ?Trigger Point Dry Needling (TDN), unbilled ?Education performed with patient at previous session regarding potential benefit and mild adverse effects of TDN.  Pt provided verbal consent to treatment. TDN performed to L splenius cervicis/capitis at C4 and C6, L UT with 0.25 x 40 single needle placements with local twitch response (LTR). Pistoning technique utilized. Improved pain-free motion following intervention.  ?  ?  ?  ?Therapeutic Exercise - for improved soft tissue flexibility and extensibility as needed for ROM, graded movement to decrease threat to activity/active motion and for gradual restoration of neck and trunk motion, repeated movement for symptom modulation and to improve ROM ?  ?  ?Supine deep flexor nod; x10, 10 sec ?[employer]; Red Tband ?Standing row with Nautilus; 30lbs; 2x12 ?Box Lift, from chair to adjacent table with 90-degree turn; performed with L and R turn ?  ?  ?*not today* ?Sidelying thoracic rotation: x10 ea ?Cat Camel in quadruped; x10 ?Hooklying anterior>posterior pelvic tilt; 2x10 ?Bridge, supine; 1x10 ?Figure-4 stretch; push and pull; x1 minute on each LE ?Lower trunk rotations; x10 ea dir ?Upper trapezius stretch; 2x30 sec, bilateral ?Seated hip abduction and adduction isometrics; x20, 5 sec with  belt/ball ?Repeated extension in lying; x10 ?Seated scapular clocks; 20x posterior ?Supine cervical spine rotation, within pt tolerance; x10 alternating L/R  ?  ?  ?  ?  ?  ?

## 2021-08-05 NOTE — Progress Notes (Signed)
?  ? ?Primary Care / Sports Medicine Office Visit ? ?Patient Information:  ?Patient ID: Jamie Reeves, female DOB: 1971/02/06 Age: 51 y.o. MRN: [id number]  ? ?Jamie Reeves is a pleasant 51 y.o. female presenting with the following: ? ?Chief Complaint  ?Patient presents with  ? Abdominal Pain  ?  X 2-3 months, upper and lower, cramping, aching pain, comes and goes, 2-3 or pain scale, pain stays in stomach   ? Melena  ?  X 1 week ago, Has happened one time, was cramping before it happened, has returned back to brown stool, nauseous, no blood when whipping or in toilet   ? ? ?Vitals:  ? 08/05/21 1553  ?BP: 118/84  ?Pulse: (!) 107  ?SpO2: 97%  ? ?Vitals:  ? 08/05/21 1553  ?Weight: 262 lb (118.8 kg)  ?Height: 5\' 9"  (1.753 m)  ? ?Body mass index is 38.69 kg/m?. ? ?No results found.  ? ?Independent interpretation of notes and tests performed by another provider:  ? ?None ? ?Procedures performed:  ? ?None ? ?Pertinent History, Exam, Impression, and Recommendations:  ? ?Gastrointestinal hemorrhage ?Patient with 1 week prior episode of 2-3 bowel movements with dark-colored stool, progressively improving, normal caliber.  This is in the setting of 2-3 months of intermittent stomach pain and nausea.  Of note, she has been dosing regular NSAIDs due to comorbid chronic lumbosacral pain.  She states that her bowels have returned to normal but she continues to have intermittent colicky abdominal pain aggravated by heavy foods. ? ?From examination standpoint, her abdomen is soft, mildly diffusely tender without rebound, is nondistended, no hepatosplenomegaly noted, normoactive bowel sounds are appreciated. ? ?I have advised transition from omeprazole to pantoprazole, discontinuation of all NSAIDs, sparing use of acetaminophen, bland GI diet, and urgent referral to gastroenterology for further evaluation and management. ? ?Gastroesophageal reflux disease without esophagitis ?This is a chronic condition that is currently  uncontrolled in the setting of concern for upper GI of bleed resulting in dark blood in stool.  I have advised a bland diet, discontinuation of NSAIDs, transition to pantoprazole. ? ?Chronic condition, exacerbated, Rx management ? ?Tobacco abuse ?Patient states that she has been tolerating Wellbutrin well without adverse effects, has been able to decrease the amount/frequency of her vape pen use and had initially scheduled a quit date for this week however due to the increased stress she has noted following GI issues, she has continued to smoke.  At this stage have advised her to continue to work towards setting a quit date and ultimately quitting, will continue Wellbutrin until follow-up in 2 months. ? ?Osteoarthritis of spine with radiculopathy, lumbosacral region ?Chronic condition previously well controlled with regular meloxicam usage, unfortunate she has noted worsened abdominal pain and concern for upper GI bleed bout.  I have advised her to discontinue NSAIDs, follow-up with GI, and referral to interventional pain/spine has been placed for additional management options in light of her relative intolerance to NSAIDs.  ? ?Orders & Medications ?Meds ordered this encounter  ?Medications  ? pantoprazole (PROTONIX) 40 MG tablet  ?  Sig: Take 1 tablet (40 mg total) by mouth daily.  ?  Dispense:  30 tablet  ?  Refill:  3  ? ?Orders Placed This Encounter  ?Procedures  ? Ambulatory referral to Pain Clinic  ? Ambulatory referral to Gastroenterology  ?  ? ?Return in about 2 months (around 10/05/2021).  ?  ? ?10/07/2021, MD ? ? Primary Care Sports Medicine ?[facility] ?   [facility]  ? ?

## 2021-08-09 ENCOUNTER — Other Ambulatory Visit: Payer: Self-pay

## 2021-08-09 ENCOUNTER — Telehealth: Payer: Self-pay

## 2021-08-09 ENCOUNTER — Ambulatory Visit: Payer: BC Managed Care – PPO | Admitting: Physical Therapy

## 2021-08-09 ENCOUNTER — Encounter: Payer: Self-pay | Admitting: Family Medicine

## 2021-08-09 DIAGNOSIS — M545 Low back pain, unspecified: Secondary | ICD-10-CM

## 2021-08-09 DIAGNOSIS — M542 Cervicalgia: Secondary | ICD-10-CM

## 2021-08-09 DIAGNOSIS — S134XXD Sprain of ligaments of cervical spine, subsequent encounter: Secondary | ICD-10-CM | POA: Diagnosis not present

## 2021-08-09 DIAGNOSIS — M546 Pain in thoracic spine: Secondary | ICD-10-CM

## 2021-08-09 NOTE — Telephone Encounter (Signed)
Please review.  KP

## 2021-08-09 NOTE — Telephone Encounter (Signed)
CALLED PATIENT NO ANSWER LEFT VOICEMAIL FOR A CALL BACK new patient office visit  ?
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
## 2021-08-10 ENCOUNTER — Telehealth: Payer: Self-pay

## 2021-08-10 ENCOUNTER — Ambulatory Visit: Payer: BC Managed Care – PPO | Admitting: Family Medicine

## 2021-08-10 ENCOUNTER — Other Ambulatory Visit: Payer: Self-pay | Admitting: Family Medicine

## 2021-08-10 NOTE — Telephone Encounter (Signed)
Patient scheduled for 09/30/2021.

## 2021-08-10 NOTE — Telephone Encounter (Signed)
CALLED PATIENT NO ANSWER LEFT VOICEMAIL FOR A CALL BACK LETTER SENT 

## 2021-08-11 ENCOUNTER — Encounter: Payer: Self-pay | Admitting: Physical Therapy

## 2021-08-12 ENCOUNTER — Ambulatory Visit: Payer: BC Managed Care – PPO | Admitting: Physical Therapy

## 2021-08-12 ENCOUNTER — Other Ambulatory Visit: Payer: Self-pay

## 2021-08-12 DIAGNOSIS — M546 Pain in thoracic spine: Secondary | ICD-10-CM

## 2021-08-12 DIAGNOSIS — M542 Cervicalgia: Secondary | ICD-10-CM

## 2021-08-12 DIAGNOSIS — S134XXD Sprain of ligaments of cervical spine, subsequent encounter: Secondary | ICD-10-CM

## 2021-08-12 DIAGNOSIS — M545 Low back pain, unspecified: Secondary | ICD-10-CM
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
## 2021-08-14 ENCOUNTER — Encounter: Payer: Self-pay | Admitting: Physical Therapy

## 2021-08-17 ENCOUNTER — Other Ambulatory Visit: Payer: Self-pay

## 2021-08-17 MED ORDER — DULOXETINE HCL 60 MG PO CPEP
60.0000 mg | ORAL_CAPSULE | Freq: Every day | ORAL | 0 refills | Status: DC
  Filled 2021-08-17: qty 90, 90d supply, fill #0

## 2021-08-17 NOTE — Telephone Encounter (Signed)
Called and left pt a VM asking who prescribed this previously since Dr Ashley Royalty has never prescribed this before. Awaiting call back. CRM created.  ?

## 2021-08-18 ENCOUNTER — Ambulatory Visit: Payer: BC Managed Care – PPO | Admitting: Physical Therapy

## 2021-08-18 DIAGNOSIS — S134XXD Sprain of ligaments of cervical spine, subsequent encounter: Secondary | ICD-10-CM

## 2021-08-18 DIAGNOSIS — M545 Low back pain, unspecified: Secondary | ICD-10-CM | POA: Diagnosis not present

## 2021-08-18 DIAGNOSIS — M542 Cervicalgia: Secondary | ICD-10-CM

## 2021-08-18 DIAGNOSIS — M546 Pain in thoracic spine: Secondary | ICD-10-CM
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
## 2021-08-19 ENCOUNTER — Ambulatory Visit: Payer: BC Managed Care – PPO | Admitting: Gastroenterology

## 2021-08-19 ENCOUNTER — Encounter: Payer: Self-pay | Admitting: Gastroenterology

## 2021-08-19 VITALS — BP 131/83 | HR 112 | Temp 97.5°F | Ht 69.0 in | Wt 261.0 lb

## 2021-08-19 DIAGNOSIS — K921 Melena: Secondary | ICD-10-CM | POA: Diagnosis not present

## 2021-08-19 DIAGNOSIS — R1013 Epigastric pain: Secondary | ICD-10-CM | POA: Diagnosis not present

## 2021-08-19 NOTE — Addendum Note (Signed)
Addended by: Roena Malady on: 08/19/2021 01:59 PM ? ? Modules accepted: Orders ? ?
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
## 2021-08-20 ENCOUNTER — Encounter: Payer: Self-pay | Admitting: Gastroenterology

## 2021-08-20 ENCOUNTER — Encounter: Payer: Self-pay | Admitting: Physical Therapy

## 2021-08-23 ENCOUNTER — Other Ambulatory Visit: Payer: Self-pay

## 2021-08-23 ENCOUNTER — Ambulatory Visit: Payer: BC Managed Care – PPO | Admitting: Anesthesiology

## 2021-08-23 ENCOUNTER — Ambulatory Visit
Admission: RE | Admit: 2021-08-23 | Discharge: 2021-08-23 | Disposition: A | Discharge status: Home / Self Care | Payer: BC Managed Care – PPO | Attending: Gastroenterology | Admitting: Gastroenterology

## 2021-08-23 ENCOUNTER — Encounter: Payer: Self-pay | Admitting: Gastroenterology

## 2021-08-23 ENCOUNTER — Encounter
Admission: RE | Disposition: A | Discharge status: Home / Self Care | Payer: Self-pay | Source: Home / Self Care | Attending: Gastroenterology

## 2021-08-23 DIAGNOSIS — Z791 Long term (current) use of non-steroidal anti-inflammatories (NSAID): Secondary | ICD-10-CM | POA: Diagnosis not present

## 2021-08-23 DIAGNOSIS — K921 Melena: Secondary | ICD-10-CM | POA: Diagnosis not present

## 2021-08-23 DIAGNOSIS — K449 Diaphragmatic hernia without obstruction or gangrene: Secondary | ICD-10-CM | POA: Diagnosis not present

## 2021-08-23 DIAGNOSIS — Z86010 Personal history of colonic polyps: Secondary | ICD-10-CM | POA: Insufficient documentation

## 2021-08-23 DIAGNOSIS — F17210 Nicotine dependence, cigarettes, uncomplicated: Secondary | ICD-10-CM | POA: Insufficient documentation

## 2021-08-23 DIAGNOSIS — Z79899 Other long term (current) drug therapy: Secondary | ICD-10-CM | POA: Insufficient documentation

## 2021-08-23 HISTORY — PX: ESOPHAGOGASTRODUODENOSCOPY: SHX5428

## 2021-08-23 LAB — POCT PREGNANCY, URINE: Preg Test, Ur: NEGATIVE

## 2021-08-23 SURGERY — EGD (ESOPHAGOGASTRODUODENOSCOPY)
Anesthesia: General | Site: Mouth

## 2021-08-23 MED ORDER — ACETAMINOPHEN 160 MG/5ML PO SOLN: 975.0000 mg | Freq: Once | ORAL | Status: DC | PRN

## 2021-08-23 MED ORDER — LACTATED RINGERS IV SOLN: INTRAVENOUS | Status: DC

## 2021-08-23 MED ORDER — SODIUM CHLORIDE 0.9 % IV SOLN: INTRAVENOUS | Status: DC

## 2021-08-23 MED ORDER — ACETAMINOPHEN 500 MG PO TABS: 1000.0000 mg | ORAL_TABLET | Freq: Once | ORAL | Status: DC | PRN

## 2021-08-23 MED ORDER — ONDANSETRON HCL 4 MG/2ML IJ SOLN: 4.0000 mg | Freq: Once | INTRAMUSCULAR | Status: DC | PRN

## 2021-08-23 MED ORDER — PROPOFOL 10 MG/ML IV BOLUS
INTRAVENOUS | Status: DC | PRN
  Administered 2021-08-23: 20 mg via INTRAVENOUS
  Administered 2021-08-23: 150 mg via INTRAVENOUS
  Administered 2021-08-23: 50 mg via INTRAVENOUS

## 2021-08-23 MED ORDER — LIDOCAINE HCL (CARDIAC) PF 100 MG/5ML IV SOSY
PREFILLED_SYRINGE | INTRAVENOUS | Status: DC | PRN
  Administered 2021-08-23: 50 mg via INTRAVENOUS

## 2021-08-23 MED ORDER — STERILE WATER FOR IRRIGATION IR SOLN
Status: DC | PRN
  Administered 2021-08-23: 1

## 2021-08-23 SURGICAL SUPPLY — 7 items
BLOCK BITE 60FR ADLT L/F GRN (MISCELLANEOUS) ×2 IMPLANT
GOWN CVR UNV OPN BCK APRN NK (MISCELLANEOUS) ×2 IMPLANT
GOWN ISOL THUMB LOOP REG UNIV (MISCELLANEOUS) ×4
KIT PRC NS LF DISP ENDO (KITS) ×1 IMPLANT
KIT PROCEDURE OLYMPUS (KITS) ×2
MANIFOLD NEPTUNE II (INSTRUMENTS) ×2 IMPLANT
WATER STERILE IRR 250ML POUR (IV SOLUTION) ×2 IMPLANT

## 2021-08-23 NOTE — Op Note (Signed)
[facility] ?Gastroenterology ?[name] ?Procedure Date: 08/23/2021 11:22 AM ?MRN: [id number] ?Account #: [id number] ?Date of Birth: 01-09-1971 ?Admit Type: Outpatient ?Age: 51 ?Room: [facility] OR ROOM 01 ?Gender: Female ?Note Status: Finalized ?Instrument Name: [id number] ?Procedure:             Upper GI endoscopy ?Indications:           Melena ?Providers:             Midge Minium MD, MD ?Referring MD:          Joseph Berkshire ?Medicines:             Propofol per Anesthesia ?Complications:         No immediate complications. ?Procedure:             Pre-Anesthesia Assessment: ?                       - Prior to the procedure, a History and Physical was  ?                       performed, and patient medications and allergies were  ?                       reviewed. The patient's tolerance of previous  ?                       anesthesia was also reviewed. The risks and benefits  ?                       of the procedure and the sedation options and risks  ?                       were discussed with the patient. All questions were  ?                       answered, and informed consent was obtained. Prior  ?                       Anticoagulants: The patient has taken no previous  ?                       anticoagulant or antiplatelet agents. ASA Grade  ?                       Assessment: II - A patient with mild systemic disease.  ?                       After reviewing the risks and benefits, the patient  ?                       was deemed in satisfactory condition to undergo the  ?                       procedure. ?                       After obtaining informed consent, the endoscope was  ?  passed under direct vision. Throughout the procedure,  ?                       the patient's blood pressure, pulse, and oxygen  ?                       saturations were monitored continuously. The Endoscope  ?                       was introduced through the mouth, and advanced to  the  ?                       second part of duodenum. The upper GI endoscopy was  ?                       accomplished without difficulty. The patient tolerated  ?                       the procedure well. ?Findings: ?     A medium-sized hiatal hernia was present. ?     The stomach was normal. ?     The examined duodenum was normal. ?Impression:            - Medium-sized hiatal hernia. ?                       - Normal stomach. ?                       - Normal examined duodenum. ?                       - No specimens collected. ?Recommendation:        - Discharge patient to home. ?                       - Resume previous diet. ?                       - Continue present medications. ?Procedure Code(s):     --- Professional --- ?                       [phone], Esophagogastroduodenoscopy, flexible,  ?                       transoral; diagnostic, including collection of  ?                       specimen(s) by brushing or washing, when performed  ?                       (separate procedure) ?Diagnosis Code(s):     --- Professional --- ?                       K92.1, Melena (includes Hematochezia) ?CPT copyright 2019 American Medical Association. All rights reserved. ?The codes documented in this report are preliminary and upon coder review may  ?be revised to meet current compliance requirements. ?Midge Miniumarren Johnnye Sandford MD, MD ?08/23/2021 11:35:47 AM ?This report has been signed electronically. ?Number of Addenda: 0 ?Note Initiated On: 08/23/2021 11:22 AM ?Total Procedure Duration: 0  hours 2 minutes 31 seconds  ?Estimated Blood Loss:  Estimated blood loss: none. ?     [facility] ?
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
## 2021-08-23 NOTE — Anesthesia Procedure Notes (Signed)
Date/Time: 08/23/2021 11:29 AM ?Performed by: Maree Krabbe, CRNA ?Pre-anesthesia Checklist: Patient identified, Emergency Drugs available, Suction available, Timeout performed and Patient being monitored ?Patient Re-evaluated:Patient Re-evaluated prior to induction ?Oxygen Delivery Method: Nasal cannula ?Placement Confirmation: positive ETCO2 ? ? ? ? ?

## 2021-08-23 NOTE — Transfer of Care (Signed)
Immediate Anesthesia Transfer of Care Note ? ?Patient: Jamie Reeves ? ?Procedure(s) Performed: ESOPHAGOGASTRODUODENOSCOPY (EGD) (Mouth) ? ?Patient Location: PACU ? ?Anesthesia Type: General ? ?Level of Consciousness: awake, alert  and patient cooperative ? ?Airway and Oxygen Therapy: Patient Spontanous Breathing and Patient connected to supplemental oxygen ? ?Post-op Assessment: Post-op Vital signs reviewed, Patient's Cardiovascular Status Stable, Respiratory Function Stable, Patent Airway and No signs of Nausea or vomiting ? ?Post-op Vital Signs: Reviewed and stable ? ?Complications: No notable events documented. ? ?

## 2021-08-23 NOTE — Anesthesia Postprocedure Evaluation (Signed)
Anesthesia Post Note ? ?Patient: [name] ? ?Procedure(s) Performed: ESOPHAGOGASTRODUODENOSCOPY (EGD) (Mouth) ? ? ?  ?Patient location during evaluation: PACU ?Anesthesia Type: General ?Level of consciousness: awake and alert ?Pain management: pain level controlled ?Vital Signs Assessment: post-procedure vital signs reviewed and stable ?Respiratory status: spontaneous breathing, nonlabored ventilation and respiratory function stable ?Cardiovascular status: blood pressure returned to baseline and stable ?Postop Assessment: no apparent nausea or vomiting ?Anesthetic complications: no ? ? ?No notable events documented. ? ?Loni Beckwith ? ? ? ? ? ?

## 2021-08-23 NOTE — Anesthesia Preprocedure Evaluation (Signed)
Anesthesia Evaluation  ?Patient identified by MRN, date of birth, ID band ?Patient awake ? ? ? ?Reviewed: ?Allergy & Precautions, H&P , NPO status , Patient's Chart, lab work & pertinent test results, reviewed documented beta blocker date and time  ? ?Airway ?Mallampati: II ? ?TM Distance: >3 FB ?Neck ROM: full ? ? ? Dental ?no notable dental hx. ? ?  ?Pulmonary ?neg pulmonary ROS, sleep apnea , Current Smoker and Patient abstained from smoking.,  ?  ?Pulmonary exam normal ?breath sounds clear to auscultation ? ? ? ? ? ? Cardiovascular ?Exercise Tolerance: Good ?negative cardio ROS ? ? ?Rhythm:regular Rate:Normal ? ? ?  ?Neuro/Psych ?PSYCHIATRIC DISORDERS Anxiety Depression negative neurological ROS ? negative psych ROS  ? GI/Hepatic ?negative GI ROS, Neg liver ROS, GERD  ,  ?Endo/Other  ?negative endocrine ROS ? Renal/GU ?negative Renal ROS  ?negative genitourinary ?  ?Musculoskeletal ? ?(+) Arthritis ,  ? Abdominal ?  ?Peds ? Hematology ?negative hematology ROS ?(+)   ?Anesthesia Other Findings ? ? Reproductive/Obstetrics ?negative OB ROS ? ?  ? ? ? ? ? ? ? ? ? ? ? ? ? ?  ?  ? ? ? ? ? ? ? ? ?Anesthesia Physical ?Anesthesia Plan ? ?ASA: 2 ? ?Anesthesia Plan: General  ? ?Post-op Pain Management:   ? ?Induction:  ? ?PONV Risk Score and Plan: 2 and TIVA, Propofol infusion and Treatment may vary due to age or medical condition ? ?Airway Management Planned:  ? ?Additional Equipment:  ? ?Intra-op Plan:  ? ?Post-operative Plan:  ? ?Informed Consent: I have reviewed the patients History and Physical, chart, labs and discussed the procedure including the risks, benefits and alternatives for the proposed anesthesia with the patient or authorized representative who has indicated his/her understanding and acceptance.  ? ? ? ?Dental Advisory Given ? ?Plan Discussed with: CRNA ? ?Anesthesia Plan Comments:   ? ? ? ? ? ? ?Anesthesia Quick Evaluation ? ?

## 2021-08-24 ENCOUNTER — Encounter: Payer: Self-pay | Admitting: Gastroenterology

## 2021-08-25 ENCOUNTER — Encounter: Payer: Self-pay | Admitting: Family Medicine

## 2021-08-25 ENCOUNTER — Encounter: Payer: BC Managed Care – PPO | Admitting: Physical Therapy

## 2021-08-25 NOTE — Telephone Encounter (Signed)
Please advise if patient needs follow up appointment or does she need to follow up with PCP ? ?

## 2021-08-25 NOTE — Telephone Encounter (Signed)
Please advise ? ?This was Midge Minium, MD response ? ?If you were having any G.I. problems, then you can see me, but if it's because of a suspected, upper GI bleed, there was no bleeding in your stomach on the endoscopy. ?

## 2021-09-01 ENCOUNTER — Ambulatory Visit: Payer: BC Managed Care – PPO | Attending: Family Medicine | Admitting: Physical Therapy

## 2021-09-01 DIAGNOSIS — M545 Low back pain, unspecified: Secondary | ICD-10-CM | POA: Insufficient documentation

## 2021-09-01 DIAGNOSIS — M546 Pain in thoracic spine: Secondary | ICD-10-CM | POA: Insufficient documentation

## 2021-09-01 DIAGNOSIS — S134XXD Sprain of ligaments of cervical spine, subsequent encounter: Secondary | ICD-10-CM | POA: Diagnosis not present

## 2021-09-01 DIAGNOSIS — M542 Cervicalgia: Secondary | ICD-10-CM | POA: Diagnosis not present
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
## 2021-09-04 ENCOUNTER — Encounter: Payer: Self-pay | Admitting: Physical Therapy
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
## 2021-09-13 ENCOUNTER — Ambulatory Visit: Payer: BC Managed Care – PPO | Admitting: Gastroenterology

## 2021-09-13 ENCOUNTER — Encounter: Payer: Self-pay | Admitting: Gastroenterology

## 2021-09-13 VITALS — BP 128/71 | HR 109 | Temp 98.7°F | Wt 261.0 lb

## 2021-09-13 DIAGNOSIS — R1084 Generalized abdominal pain: Secondary | ICD-10-CM

## 2021-09-28 DIAGNOSIS — F419 Anxiety disorder, unspecified: Secondary | ICD-10-CM | POA: Diagnosis not present

## 2021-09-28 DIAGNOSIS — E782 Mixed hyperlipidemia: Secondary | ICD-10-CM | POA: Diagnosis not present

## 2021-09-28 DIAGNOSIS — F32A Depression, unspecified: Secondary | ICD-10-CM | POA: Diagnosis not present

## 2021-09-28 DIAGNOSIS — Z79899 Other long term (current) drug therapy: Secondary | ICD-10-CM | POA: Diagnosis not present

## 2021-09-28 DIAGNOSIS — E669 Obesity, unspecified: Secondary | ICD-10-CM | POA: Diagnosis not present

## 2021-09-28 DIAGNOSIS — Z7689 Persons encountering health services in other specified circumstances: Secondary | ICD-10-CM | POA: Diagnosis not present

## 2021-09-28 DIAGNOSIS — K449 Diaphragmatic hernia without obstruction or gangrene: Secondary | ICD-10-CM | POA: Diagnosis not present

## 2021-09-28 DIAGNOSIS — Z6839 Body mass index (BMI) 39.0-39.9, adult: Secondary | ICD-10-CM | POA: Diagnosis not present

## 2021-09-28 DIAGNOSIS — R002 Palpitations: Secondary | ICD-10-CM | POA: Diagnosis not present

## 2021-09-28 DIAGNOSIS — K219 Gastro-esophageal reflux disease without esophagitis: Secondary | ICD-10-CM | POA: Diagnosis not present

## 2021-09-30 ENCOUNTER — Ambulatory Visit: Payer: BC Managed Care – PPO | Admitting: Gastroenterology

## 2021-10-04 ENCOUNTER — Ambulatory Visit: Payer: BC Managed Care – PPO | Admitting: Family Medicine

## 2021-10-04 ENCOUNTER — Encounter: Payer: Self-pay | Admitting: Family Medicine

## 2021-10-04 VITALS — BP 106/74 | HR 89 | Ht 69.0 in | Wt 264.0 lb

## 2021-10-04 DIAGNOSIS — M47817 Spondylosis without myelopathy or radiculopathy, lumbosacral region: Secondary | ICD-10-CM | POA: Diagnosis not present

## 2021-10-04 DIAGNOSIS — G2581 Restless legs syndrome: Secondary | ICD-10-CM

## 2021-10-04 DIAGNOSIS — G4733 Obstructive sleep apnea (adult) (pediatric): Secondary | ICD-10-CM | POA: Diagnosis not present

## 2021-10-04 DIAGNOSIS — Z6838 Body mass index (BMI) 38.0-38.9, adult: Secondary | ICD-10-CM

## 2021-10-04 DIAGNOSIS — E662 Morbid (severe) obesity with alveolar hypoventilation: Secondary | ICD-10-CM | POA: Diagnosis not present

## 2021-10-04 MED ORDER — ROPINIROLE HCL 0.25 MG PO TABS: 0.7500 mg | ORAL_TABLET | Freq: Every day | ORAL | 0 refills | Status: DC

## 2021-10-04 MED ORDER — DULOXETINE HCL 60 MG PO CPEP: 60.0000 mg | ORAL_CAPSULE | Freq: Every day | ORAL | 0 refills | Status: DC

## 2021-10-04 MED ORDER — GABAPENTIN 300 MG PO CAPS: 300.0000 mg | ORAL_CAPSULE | Freq: Three times a day (TID) | ORAL | 3 refills | Status: DC

## 2021-10-04 NOTE — Patient Instructions (Addendum)
-   Referral coordinator will contact you in regards to scheduling sleep medicine follow-up to discuss inspire device ?- Start gabapentin ?- Continue remaining additional medications ?- Can discuss possible initiation of Wegovy with bariatric group, can contact us if needing Rx ?- Return for follow-up in 3 months ?

## 2021-10-04 NOTE — Assessment & Plan Note (Signed)
Patient has established with bariatric surgical group, ongoing work-up. ?

## 2021-10-04 NOTE — Progress Notes (Signed)
?  ? ?Primary Care / Sports Medicine Office Visit ? ?Patient Information:  ?Patient ID: Karrina Lye, female DOB: [birth year] Age: 51 y.o. MRN: [id number]  ? ?Rukia Mcgillivray is a pleasant 51 y.o. female presenting with the following: ? ?Chief Complaint  ?Patient presents with  ? Insomnia  ?  Still tired, sleep pattern not consistent, wakes up 4-5 times at night, still feels tired when she wake up in the morning  ?  ? ? ?Vitals:  ? 10/04/21 1524  ?BP: 106/74  ?Pulse: 89  ?SpO2: 98%  ? ?Vitals:  ? 10/04/21 1524  ?Weight: 264 lb (119.7 kg)  ?Height: 5\' 9"  (1.753 m)  ? ?Body mass index is 38.99 kg/m?. ? ?No results found.  ? ?Independent interpretation of notes and tests performed by another provider:  ? ?None ? ?Procedures performed:  ? ?None ? ?Pertinent History, Exam, Impression, and Recommendations:  ? ?Problem List Items Addressed This Visit   ? ?  ? Respiratory  ? OSA (obstructive sleep apnea)  ?  Patient with persistent sleep related issues setting insomnia, multiple nighttime awakenings, lack of restful sleep.  She does have a diagnosis of OSA and utilizes a CPAP with a mask that covers her nose and mouth due to breathing primarily through her mouth.  She has trialed nasal pillow without response due to aforementioned mouth breathing, is amenable to further evaluation by sleep medicine to consider inspire device, appropriate referral placed today. ? ?  ?  ? Relevant Orders  ? Ambulatory referral to Sleep Studies  ?  ? Musculoskeletal and Integument  ? Spondylosis of 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 or radiculopathy - Primary  ?  Chronic issue with ongoing symptomatology, a referral to pain and spine group has been placed 2 months prior, plan is to contact the group for possible scheduling.  She continues to be symptomatic at both her cervical and lumbar spine, this is disrupting her sleep as well.  Given her comorbid medical conditions we will hold from NSAIDs, steroids, can continue muscle relaxer, will  initiate low-dose gabapentin, coordinate follow-up to assess response.  She will also remain on her current Cymbalta. ? ?  ?  ? Relevant Medications  ? gabapentin (NEURONTIN) 300 MG capsule  ? DULoxetine (CYMBALTA) 60 MG capsule  ?  ? Other  ? Class 2 obesity with alveolar hypoventilation, serious comorbidity, and body mass index (BMI) of 38.0 to 38.9 in adult [facility])  ?  Patient has established with bariatric surgical group, ongoing work-up. ? ?  ?  ? ?Other Visit Diagnoses   ? ? Restless leg      ? Relevant Medications  ? rOPINIRole (REQUIP) 0.25 MG tablet  ? ?  ?  ? ?Orders & Medications ?Meds ordered this encounter  ?Medications  ? gabapentin (NEURONTIN) 300 MG capsule  ?  Sig: Take 1 capsule (300 mg total) by mouth 3 (three) times daily.  ?  Dispense:  90 capsule  ?  Refill:  3  ? DULoxetine (CYMBALTA) 60 MG capsule  ?  Sig: Take 1 capsule (60 mg total) by mouth daily.  ?  Dispense:  90 capsule  ?  Refill:  0  ? rOPINIRole (REQUIP) 0.25 MG tablet  ?  Sig: Take 3 tablets (0.75 mg total) by mouth at bedtime.  ?  Dispense:  270 tablet  ?  Refill:  0  ? ?Orders Placed This Encounter  ?Procedures  ? Ambulatory referral to Sleep Studies  ?  ? ?Return in about  3 months (around 01/04/2022).  ?  ? ?Jerrol Banana, MD ? ? Primary Care Sports Medicine ?[facility] ? [facility]  ? ?

## 2021-10-04 NOTE — Assessment & Plan Note (Signed)
Chronic issue with ongoing symptomatology, a referral to pain and spine group has been placed 2 months prior, plan is to contact the group for possible scheduling.  She continues to be symptomatic at both her cervical and lumbar spine, this is disrupting her sleep as well.  Given her comorbid medical conditions we will hold from NSAIDs, steroids, can continue muscle relaxer, will initiate low-dose gabapentin, coordinate follow-up to assess response.  She will also remain on her current Cymbalta. ?

## 2021-10-04 NOTE — Assessment & Plan Note (Signed)
Patient with persistent sleep related issues setting insomnia, multiple nighttime awakenings, lack of restful sleep.  She does have a diagnosis of OSA and utilizes a CPAP with a mask that covers her nose and mouth due to breathing primarily through her mouth.  She has trialed nasal pillow without response due to aforementioned mouth breathing, is amenable to further evaluation by sleep medicine to consider inspire device, appropriate referral placed today. ?

## 2021-10-05 ENCOUNTER — Ambulatory Visit: Payer: BC Managed Care – PPO | Admitting: Family Medicine

## 2021-10-07 DIAGNOSIS — E782 Mixed hyperlipidemia: Secondary | ICD-10-CM | POA: Diagnosis not present

## 2021-10-07 DIAGNOSIS — F324 Major depressive disorder, single episode, in partial remission: Secondary | ICD-10-CM | POA: Diagnosis not present

## 2021-10-07 DIAGNOSIS — Z6835 Body mass index (BMI) 35.0-35.9, adult: Secondary | ICD-10-CM | POA: Diagnosis not present

## 2021-10-07 DIAGNOSIS — E669 Obesity, unspecified: Secondary | ICD-10-CM | POA: Diagnosis not present

## 2021-10-07 DIAGNOSIS — Z01818 Encounter for other preprocedural examination: Secondary | ICD-10-CM | POA: Diagnosis not present

## 2021-10-07 DIAGNOSIS — Z79899 Other long term (current) drug therapy: Secondary | ICD-10-CM | POA: Diagnosis not present

## 2021-10-07 DIAGNOSIS — F419 Anxiety disorder, unspecified: Secondary | ICD-10-CM | POA: Diagnosis not present

## 2021-10-07 DIAGNOSIS — Z7689 Persons encountering health services in other specified circumstances: Secondary | ICD-10-CM | POA: Diagnosis not present

## 2021-10-07 DIAGNOSIS — F17200 Nicotine dependence, unspecified, uncomplicated: Secondary | ICD-10-CM | POA: Diagnosis not present

## 2021-10-21 ENCOUNTER — Ambulatory Visit: Payer: BC Managed Care – PPO | Admitting: Gastroenterology

## 2021-10-23 IMAGING — CR DG LUMBAR SPINE COMPLETE 4+V
5 series · 5 of 5 positions shown · non-contrast
Comparison: None

CLINICAL DATA: Chronic low back pain with radiation to both legs,
history of spondylosis, bulging disc, arthritis, bone spurs,
restless legs, worsened symptoms for 2 weeks

EXAM:
LUMBAR SPINE - COMPLETE 4+ VIEW

[l-spine ap]
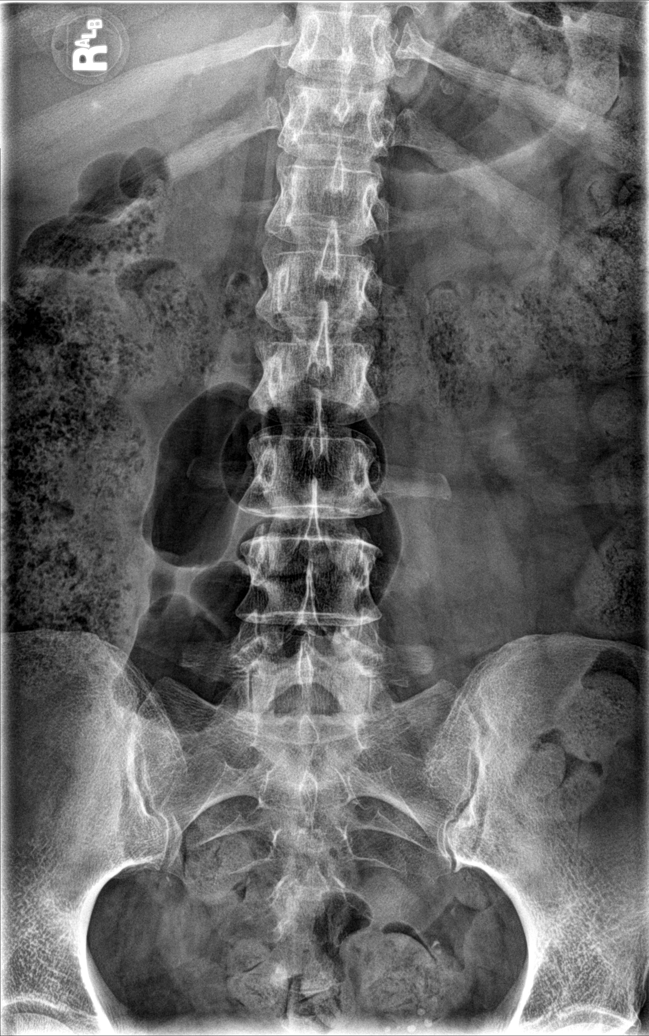

[l-spine obl (1 of 2)]
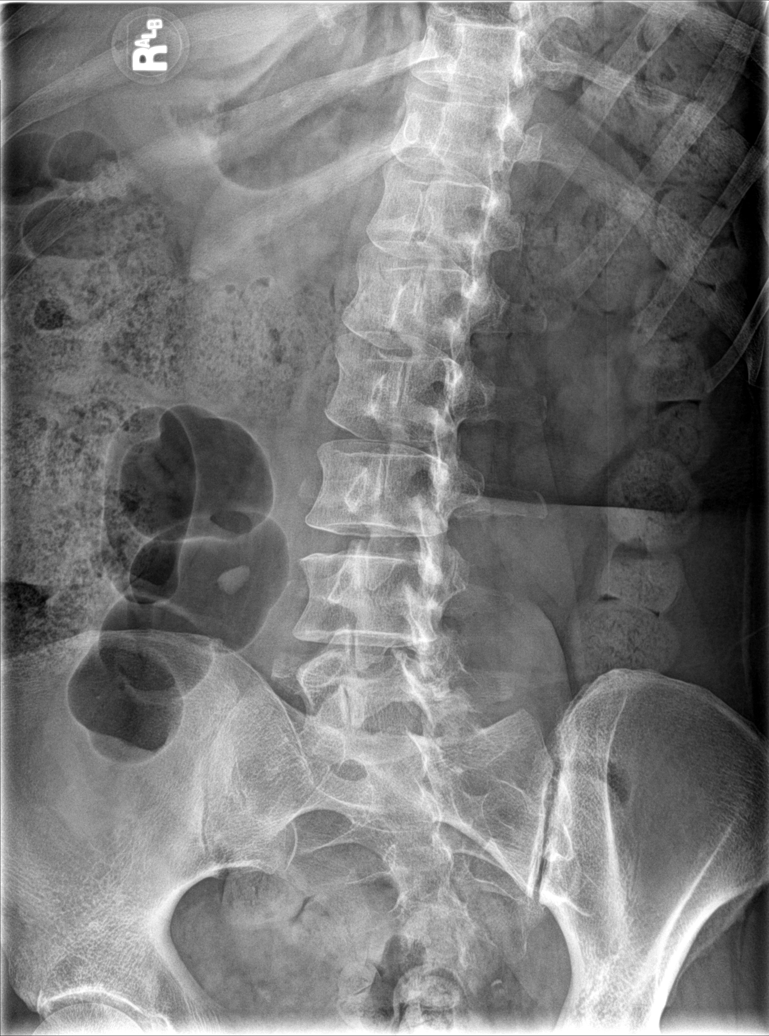

[l-spine obl (2 of 2)]
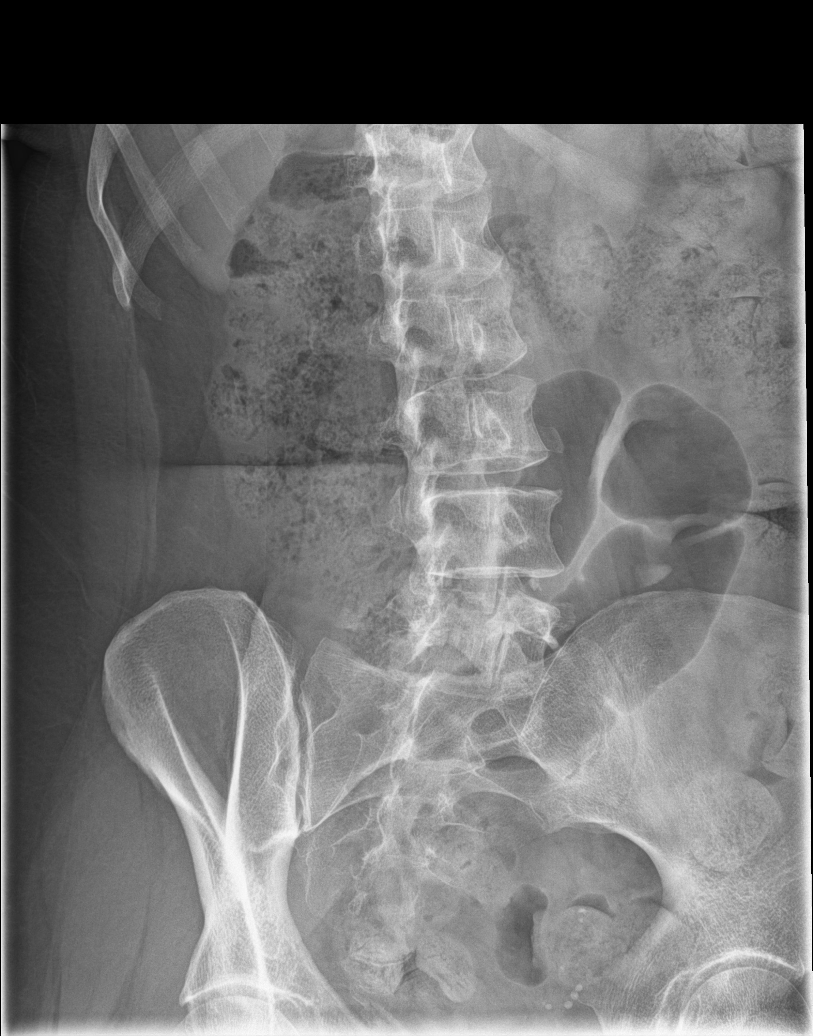

[l-spine lat]
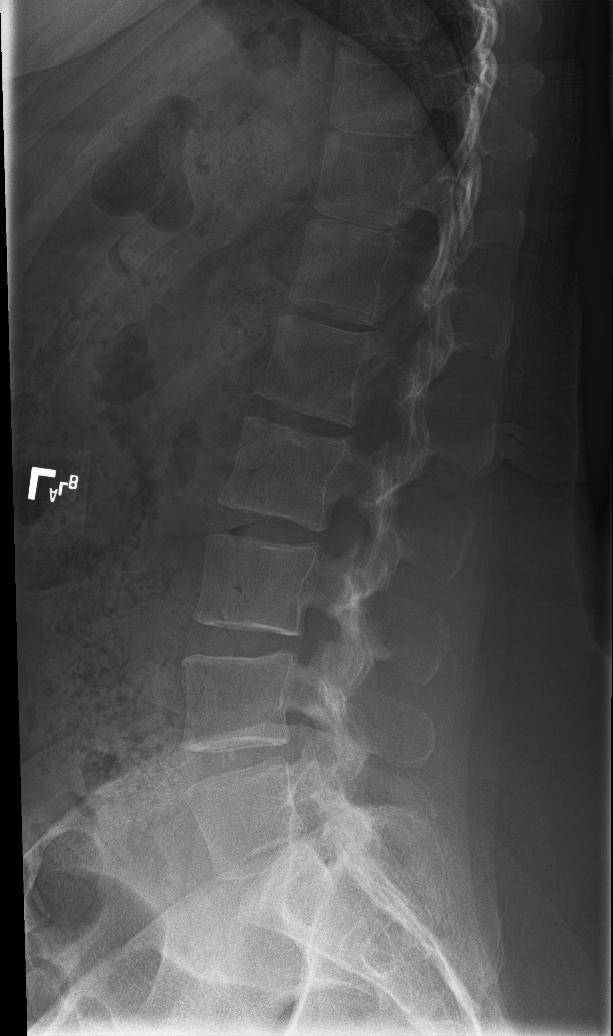

[l-spine spot]
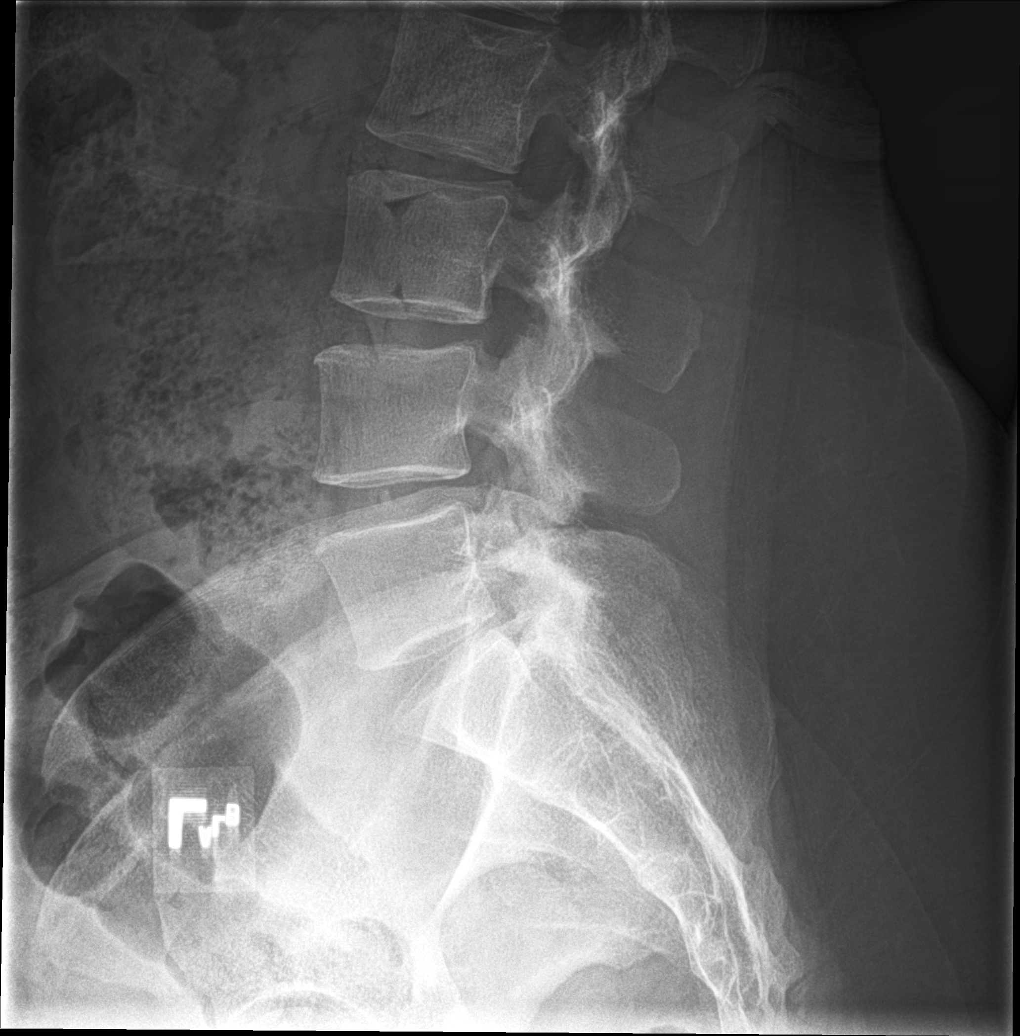

[5 of 5 positions shown; findings below may reference images not displayed]

FINDINGS: Hypoplastic last rib pair with 5 additional non-rib-bearing lumbar
type vertebra.

Mild osseous demineralization questioned.

Vertebral body and disc space heights maintained.

No fracture, subluxation, or bone destruction.

No spondylolysis.

SI joints preserved.

Increased stool throughout colon.
IMPRESSION: No acute lumbar spine abnormalities.

Increased stool throughout colon.

## 2021-10-25 ENCOUNTER — Encounter: Payer: Self-pay | Admitting: Family Medicine

## 2021-11-15 NOTE — Progress Notes (Addendum)
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 To be determined at a later time  Other analgesic(s): To be determined at a later time   Interventional management options: Jamie Reeves was informed that there is no guarantee that she would be a candidate for interventional therapies. The decision will be based on the results of diagnostic studies, as well as Jamie Reeves's risk profile.  Procedure(s) under consideration:  Pending results of ordered studies      Interventional Therapies  Risk  Complexity Considerations:   Estimated body mass index is 42.13 kg/m as calculated from the following:   Height as of this encounter: 5' 7" (1.702 m).   Weight as of this encounter: 269 lb (122 kg). WNL   Planned  Pending:   Pending further evaluation   Under consideration:   Diagnostic bilateral SI joint block #1  Diagnostic bilateral lumbar facet MBB #1  Diagnostic right cervical facet MBB #1    Completed:   None at  this time   Therapeutic  Palliative (PRN) options:   None established    Provider-requested follow-up: Return for (40min), Eval-day (M,W), (F2F), 2nd Visit, for review of ordered tests.  Future Appointments  Date Time Provider Department Center  12/15/2021  2:20 PM Enola Siebers, MD [facility] None  01/04/2022  3:20 PM Matthews, Jason J, MD [facility]    Note by: Tashauna Caisse A Aylssa Herrig, MD Date: 11/17/2021; Time: 3:51 PM 

## 2021-11-17 ENCOUNTER — Ambulatory Visit
Admission: RE | Admit: 2021-11-17 | Discharge: 2021-11-17 | Disposition: A | Discharge status: Home / Self Care | Payer: BC Managed Care – PPO | Source: Ambulatory Visit | Attending: Pain Medicine | Admitting: Pain Medicine

## 2021-11-17 ENCOUNTER — Encounter: Payer: Self-pay | Admitting: Pain Medicine

## 2021-11-17 ENCOUNTER — Ambulatory Visit (HOSPITAL_BASED_OUTPATIENT_CLINIC_OR_DEPARTMENT_OTHER): Payer: BC Managed Care – PPO | Admitting: Pain Medicine

## 2021-11-17 VITALS — BP 137/83 | HR 68 | Temp 98.8°F | Resp 16 | Ht 67.0 in | Wt 269.0 lb

## 2021-11-17 DIAGNOSIS — G4733 Obstructive sleep apnea (adult) (pediatric): Secondary | ICD-10-CM

## 2021-11-17 DIAGNOSIS — K219 Gastro-esophageal reflux disease without esophagitis: Secondary | ICD-10-CM

## 2021-11-17 DIAGNOSIS — R0602 Shortness of breath: Secondary | ICD-10-CM

## 2021-11-17 DIAGNOSIS — M25552 Pain in left hip: Secondary | ICD-10-CM | POA: Insufficient documentation

## 2021-11-17 DIAGNOSIS — M542 Cervicalgia: Secondary | ICD-10-CM | POA: Insufficient documentation

## 2021-11-17 DIAGNOSIS — Z789 Other specified health status: Secondary | ICD-10-CM | POA: Diagnosis not present

## 2021-11-17 DIAGNOSIS — R700 Elevated erythrocyte sedimentation rate: Secondary | ICD-10-CM

## 2021-11-17 DIAGNOSIS — M545 Low back pain, unspecified: Secondary | ICD-10-CM | POA: Insufficient documentation

## 2021-11-17 DIAGNOSIS — Z79899 Other long term (current) drug therapy: Secondary | ICD-10-CM | POA: Diagnosis not present

## 2021-11-17 DIAGNOSIS — G8929 Other chronic pain: Secondary | ICD-10-CM | POA: Insufficient documentation

## 2021-11-17 DIAGNOSIS — Z7689 Persons encountering health services in other specified circumstances: Secondary | ICD-10-CM | POA: Diagnosis not present

## 2021-11-17 DIAGNOSIS — E669 Obesity, unspecified: Secondary | ICD-10-CM | POA: Diagnosis not present

## 2021-11-17 DIAGNOSIS — M79605 Pain in left leg: Secondary | ICD-10-CM | POA: Insufficient documentation

## 2021-11-17 DIAGNOSIS — G894 Chronic pain syndrome: Secondary | ICD-10-CM | POA: Diagnosis not present

## 2021-11-17 DIAGNOSIS — G5793 Unspecified mononeuropathy of bilateral lower limbs: Secondary | ICD-10-CM

## 2021-11-17 DIAGNOSIS — G5790 Unspecified mononeuropathy of unspecified lower limb: Secondary | ICD-10-CM | POA: Insufficient documentation

## 2021-11-17 DIAGNOSIS — M4727 Other spondylosis with radiculopathy, lumbosacral region: Secondary | ICD-10-CM | POA: Diagnosis not present

## 2021-11-17 DIAGNOSIS — Z6835 Body mass index (BMI) 35.0-35.9, adult: Secondary | ICD-10-CM | POA: Insufficient documentation

## 2021-11-17 DIAGNOSIS — M899 Disorder of bone, unspecified: Secondary | ICD-10-CM

## 2021-11-17 DIAGNOSIS — M25551 Pain in right hip: Secondary | ICD-10-CM | POA: Insufficient documentation

## 2021-11-17 DIAGNOSIS — M79604 Pain in right leg: Secondary | ICD-10-CM

## 2021-11-17 DIAGNOSIS — M533 Sacrococcygeal disorders, not elsewhere classified: Secondary | ICD-10-CM | POA: Diagnosis not present

## 2021-11-17 DIAGNOSIS — E782 Mixed hyperlipidemia: Secondary | ICD-10-CM | POA: Diagnosis not present

## 2021-11-17 NOTE — Patient Instructions (Signed)
______________________________________________________________________________________________  Body mass index (BMI)  Body mass index (BMI) is a common tool for deciding whether a person has an appropriate body weight.  It measures a persons weight in relation to their height.   According to the [facility] of health (NIH): A BMI of less than 18.5 means that a person is underweight. A BMI of between 18.5 and 24.9 is ideal. A BMI of between 25 and 29.9 is overweight. A BMI over 30 indicates obesity.  Weight Management Required  URGENT: Your weight has been found to be adversely affecting your health.  Dear Ms. Wendorf:  Your current Estimated body mass index is 38.99 kg/m as calculated from the following:   Height as of 10/04/21: $RemoveBef'5\' 9"'nouRpuizts$  (1.753 m).   Weight as of 10/04/21: 264 lb (119.7 kg).  Please use the table below to identify your weight category and associated incidence of chronic pain, secondary to your weight.  Body Mass Index (BMI) Classification BMI level (kg/m2) Category Associated incidence of chronic pain  <18  Underweight   18.5-24.9 Ideal body weight   25-29.9 Overweight  20%  30-34.9 Obese (Class I)  68%  35-39.9 Severe obesity (Class II)  136%  >40 Extreme obesity (Class III)  254%   In addition: You will be considered "Morbidly Obese", if your BMI is above 30 and you have one or more of the following conditions which are known to be caused and/or directly associated with obesity: 1.    Type 2 Diabetes (Which in turn can lead to cardiovascular diseases (CVD), stroke, peripheral vascular diseases (PVD), retinopathy, nephropathy, and neuropathy) 2.    Cardiovascular Disease (High Blood Pressure; Congestive Heart Failure; High Cholesterol; Coronary Artery Disease; Angina; or History of Heart Attacks) 3.    Breathing problems (Asthma; obesity-hypoventilation syndrome; obstructive sleep apnea; chronic inflammatory airway disease; reactive airway disease; or  shortness of breath) 4.    Chronic kidney disease 5.    Liver disease (nonalcoholic fatty liver disease) 6.    High blood pressure 7.    Acid reflux (gastroesophageal reflux disease; heartburn) 8.    Osteoarthritis (OA) (with any of the following: hip pain; knee pain; and/or low back pain) 9.    Low back pain (Lumbar Facet Syndrome; and/or Degenerative Disc Disease) 10.  Hip pain (Osteoarthritis of hip) (For every 1 lbs of added body weight, there is a 2 lbs increase in pressure inside of each hip articulation. 1:2 mechanical relationship) 11.  Knee pain (Osteoarthritis of knee) (For every 1 lbs of added body weight, there is a 4 lbs increase in pressure inside of each knee articulation. 1:4 mechanical relationship) (patients with a BMI>30 kg/m2 were 6.8 times more likely to develop knee OA than normal-weight individuals) 12.  Cancer: Epidemiological studies have shown that obesity is a risk factor for: post-menopausal breast cancer; cancers of the endometrium, colon and kidney cancer; malignant adenomas of the oesophagus. Obese subjects have an approximately 1.5-3.5-fold increased risk of developing these cancers compared with normal-weight subjects, and it has been estimated that between 15 and 45% of these cancers can be attributed to overweight. More recent studies suggest that obesity may also increase the risk of other types of cancer, including pancreatic, hepatic and gallbladder cancer. (Ref: Obesity and cancer. Pischon T, Nthlings U, [employer] H. Proc Nutr Soc. 2008 May;67(2):128-45. doi: 05.6979/Y8016553748270786.) The International Agency for Research on Cancer (IARC) has identified 13 cancers associated with overweight and obesity: meningioma, multiple myeloma, adenocarcinoma of the esophagus, and cancers of the thyroid,  postmenopausal breast cancer, gallbladder, stomach, liver, pancreas, kidney, ovaries, uterus, colon and rectal (colorectal) cancers. 55 percent of all cancers diagnosed in women  and 24 percent of those diagnosed in men are associated with overweight and obesity.  Recommendation: At this point it is urgent that you take a step back and concentrate in loosing weight. Dedicate 100% of your efforts on this task. Nothing else will improve your health more than bringing your weight down and your BMI to less than 30. If you are here, you probably have chronic pain. We know that most chronic pain patients have difficulty exercising secondary to their pain. For this reason, you must rely on proper nutrition and diet in order to lose the weight. If your BMI is above 40, you should seriously consider bariatric surgery. A realistic goal is to lose 10% of your body weight over a period of 12 months.  Be honest to yourself, if over time you have unsuccessfully tried to lose weight, then it is time for you to seek professional help and to enter a medically supervised weight management program, and/or undergo bariatric surgery. Stop procrastinating.   Pain management considerations:  1.    Pharmacological Problems: Be advised that the use of opioid analgesics (oxycodone; hydrocodone; morphine; methadone; codeine; and all of their derivatives) have been associated with decreased metabolism and weight gain.  For this reason, should we see that you are unable to lose weight while taking these medications, it may become necessary for us to taper down and indefinitely discontinue them.  2.    Technical Problems: The incidence of successful interventional therapies decreases as the patient's BMI increases. It is much more difficult to accomplish a safe and effective interventional therapy on a patient with a BMI above 35. 3.    Radiation Exposure Problems: The x-rays machine, used to accomplish injection therapies, will automatically increase their x-ray output in order to capture an appropriate bone image. This means that radiation exposure increases exponentially with the patient's BMI. (The higher the  BMI, the higher the radiation exposure.) Although the level of radiation used at a given time is still safe to the patient, it is not for the physician and/or assisting staff. Unfortunately, radiation exposure is accumulative. Because physicians and the staff have to do procedures and be exposed on a daily basis, this can result in health problems such as cancer and radiation burns. Radiation exposure to the staff is monitored by the radiation batches that they wear. The exposure levels are reported back to the staff on a quarterly basis. Depending on levels of exposure, physicians and staff may be obligated by law to decrease this exposure. This means that they have the right and obligation to refuse providing therapies where they may be overexposed to radiation. For this reason, physicians may decline to offer therapies such as radiofrequency ablation or implants to patients with a BMI above 40. 4.    Current Trends: Be advised that the current trend is to no longer offer certain therapies to patients with a BMI equal to, or above 35, due to increase perioperative risks, increased technical procedural difficulties, and excessive radiation exposure to healthcare personnel.  ______________________________________________________________________________________________    

## 2021-11-20 DIAGNOSIS — R700 Elevated erythrocyte sedimentation rate: Secondary | ICD-10-CM | POA: Insufficient documentation

## 2021-11-22 LAB — COMP. METABOLIC PANEL (12)
AST: 27 IU/L (ref 0–40)
Albumin/Globulin Ratio: 1.5 (ref 1.2–2.2)
Albumin: 4.2 g/dL (ref 3.8–4.8)
Alkaline Phosphatase: 96 IU/L (ref 44–121)
BUN/Creatinine Ratio: 19 (ref 9–23)
BUN: 16 mg/dL (ref 6–24)
Bilirubin Total: 0.3 mg/dL (ref 0.0–1.2)
Calcium: 9.7 mg/dL (ref 8.7–10.2)
Chloride: 102 mmol/L (ref 96–106)
Creatinine, Ser: 0.84 mg/dL (ref 0.57–1.00)
Globulin, Total: 2.8 g/dL (ref 1.5–4.5)
Glucose: 94 mg/dL (ref 70–99)
Potassium: 5 mmol/L (ref 3.5–5.2)
Sodium: 140 mmol/L (ref 134–144)
Total Protein: 7 g/dL (ref 6.0–8.5)
eGFR: 85 mL/min/{1.73_m2} (ref >59)

## 2021-11-22 LAB — MAGNESIUM: Magnesium: 2.2 mg/dL (ref 1.6–2.3)

## 2021-11-22 LAB — 25-HYDROXY VITAMIN D LCMS D2+D3
25-Hydroxy, Vitamin D-2: <1 ng/mL
25-Hydroxy, Vitamin D-3: 44 ng/mL
25-Hydroxy, Vitamin D: 44 ng/mL

## 2021-11-22 LAB — COMPLIANCE DRUG ANALYSIS, UR

## 2021-11-22 LAB — VITAMIN B12: Vitamin B-12: 410 pg/mL (ref 232–1245)

## 2021-11-22 LAB — SEDIMENTATION RATE: Sed Rate: 46 mm/hr — ABNORMAL HIGH (ref 0–40)

## 2021-11-22 LAB — C-REACTIVE PROTEIN: CRP: 6 mg/L (ref 0–10)

## 2021-11-24 ENCOUNTER — Encounter: Payer: Self-pay | Admitting: Family Medicine

## 2021-11-24 NOTE — Telephone Encounter (Signed)
Please advise 

## 2021-11-25 ENCOUNTER — Encounter: Payer: Self-pay | Admitting: Pain Medicine

## 2021-11-26 ENCOUNTER — Other Ambulatory Visit: Payer: Self-pay

## 2021-11-26 ENCOUNTER — Ambulatory Visit
Admission: RE | Admit: 2021-11-26 | Discharge: 2021-11-26 | Disposition: A | Discharge status: Home / Self Care | Payer: BC Managed Care – PPO | Source: Ambulatory Visit | Attending: Family Medicine | Admitting: Family Medicine

## 2021-11-26 DIAGNOSIS — R0989 Other specified symptoms and signs involving the circulatory and respiratory systems: Secondary | ICD-10-CM | POA: Diagnosis not present

## 2021-11-26 DIAGNOSIS — M7989 Other specified soft tissue disorders: Secondary | ICD-10-CM | POA: Diagnosis not present

## 2021-11-26 DIAGNOSIS — R2243 Localized swelling, mass and lump, lower limb, bilateral: Secondary | ICD-10-CM

## 2021-11-26 NOTE — Telephone Encounter (Signed)
Order placed, referral team is working on this. Will call patient as soon as I know.

## 2021-12-03 ENCOUNTER — Other Ambulatory Visit: Payer: Self-pay | Admitting: Family Medicine

## 2021-12-03 DIAGNOSIS — K219 Gastro-esophageal reflux disease without esophagitis: Secondary | ICD-10-CM
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
## 2021-12-06 DIAGNOSIS — E782 Mixed hyperlipidemia: Secondary | ICD-10-CM | POA: Diagnosis not present

## 2021-12-06 DIAGNOSIS — R0602 Shortness of breath: Secondary | ICD-10-CM | POA: Diagnosis not present

## 2021-12-06 DIAGNOSIS — G4733 Obstructive sleep apnea (adult) (pediatric): Secondary | ICD-10-CM | POA: Diagnosis not present
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
## 2021-12-15 ENCOUNTER — Ambulatory Visit (HOSPITAL_BASED_OUTPATIENT_CLINIC_OR_DEPARTMENT_OTHER): Payer: BC Managed Care – PPO | Admitting: Pain Medicine

## 2021-12-15 ENCOUNTER — Encounter: Payer: Self-pay | Admitting: Family Medicine

## 2021-12-15 ENCOUNTER — Ambulatory Visit (INDEPENDENT_AMBULATORY_CARE_PROVIDER_SITE_OTHER): Payer: BC Managed Care – PPO | Admitting: Family Medicine

## 2021-12-15 VITALS — BP 130/86 | HR 130 | Ht 69.0 in | Wt 268.4 lb

## 2021-12-15 DIAGNOSIS — U071 COVID-19: Secondary | ICD-10-CM | POA: Insufficient documentation

## 2021-12-15 DIAGNOSIS — Z91199 Patient's noncompliance with other medical treatment and regimen due to unspecified reason: Secondary | ICD-10-CM

## 2021-12-15 DIAGNOSIS — R051 Acute cough: Secondary | ICD-10-CM | POA: Diagnosis not present

## 2021-12-15 LAB — POC COVID19 BINAXNOW: SARS Coronavirus 2 Ag: POSITIVE — AB

## 2021-12-15 MED ORDER — MOLNUPIRAVIR EUA 200MG CAPSULE: 4.0000 | ORAL_CAPSULE | Freq: Two times a day (BID) | ORAL | 0 refills | Status: AC

## 2021-12-15 MED ORDER — PROMETHAZINE-DM 6.25-15 MG/5ML PO SYRP: 5.0000 mL | ORAL_SOLUTION | Freq: Four times a day (QID) | ORAL | 0 refills | Status: DC | PRN

## 2021-12-15 MED ORDER — MOMETASONE FUROATE 50 MCG/ACT NA SUSP: NASAL | 6 refills | Status: DC

## 2021-12-15 NOTE — Patient Instructions (Addendum)
-   Dose molnupiravir for full course - Use steroid nasal spray x 1 week - Use Mucinex twice daily x 1 week - Can use Tylenol and cough medicine as-needed - Consider pulse oximeter and ensure numbers 98 or above - Stay out of work this week - Drink plenty of water, get rest, use vitamins C, D, and take Zinc - Contact us this Friday if symptoms not improving or worsening - Seek ER for severe symptoms or worsening over weekend

## 2021-12-15 NOTE — Progress Notes (Signed)
     Primary Care / Sports Medicine Office Visit  Patient Information:  Patient ID: Jamie Reeves, female DOB: 1971-03-25 Age: 51 y.o. MRN: [id number]   Jamie Reeves is a pleasant 51 y.o. female presenting with the following:  Chief Complaint  Patient presents with   URI    Body aches, nasal congestion, sore throat, cough since Saturday. Whole house has had this.     Vitals:   12/15/21 0952  BP: 130/86  Pulse: (!) 130  SpO2: 96%   Vitals:   12/15/21 0952  Weight: 268 lb 6.4 oz (121.7 kg)  Height: 5\' 9"  (1.753 m)   Body mass index is 39.64 kg/m.     Independent interpretation of notes and tests performed by another provider:   None  Procedures performed:   None  Pertinent History, Exam, Impression, and Recommendations:   Problem List Items Addressed This Visit       Other   COVID - Primary    Patient with onset of symptoms 3-4 days prior, multiple family sick contacts with similar symptoms, their symptoms improved while hers persisted.  She has congestion, eye pressure, dry cough, chills and subjective fevers.  She denies any shortness of air.  Examination reveals nontoxic-appearing individual in no respiratory or distress otherwise, equal air entry noted throughout without wheezes, rales, rhonchi, benign cardiac sounds, nontender sinuses, left tympanic membrane with erythema, canal and contralateral ear benign, oropharynx benign, nasopharynx with mild erythema, no swelling.  Patient was tested and found to be COVID-positive, she is unvaccinated and has comorbid health risks, plan for molnupiravir regimen, transition of Flonase to Nasonex, as needed promethazine dextromethorphan, and other OTC and nonpharmacologic supportive care options.  I have advised patient to appropriately isolate and to remain out of work until next week, she can return to work if afebrile x48 hours.  She is to contact towards the end of this week and early next week for any lack of  adequate progress, worsening symptoms over the weekend are to be addressed in the ER.      Relevant Medications   molnupiravir EUA (LAGEVRIO) 200 mg CAPS capsule   mometasone (NASONEX) 50 MCG/ACT nasal spray   promethazine-dextromethorphan (PROMETHAZINE-DM) 6.25-15 MG/5ML syrup   Other Visit Diagnoses     Acute cough       Relevant Orders   POC COVID-19 (Completed)        Orders & Medications Meds ordered this encounter  Medications   molnupiravir EUA (LAGEVRIO) 200 mg CAPS capsule    Sig: Take 4 capsules (800 mg total) by mouth 2 (two) times daily for 5 days.    Dispense:  40 capsule    Refill:  0   mometasone (NASONEX) 50 MCG/ACT nasal spray    Sig: One spray in each nostril twice a day, use left hand for right nostril, and right hand for left nostril.  Please dispense one bottle.    Dispense:  1 g    Refill:  6   promethazine-dextromethorphan (PROMETHAZINE-DM) 6.25-15 MG/5ML syrup    Sig: Take 5 mLs by mouth 4 (four) times daily as needed for cough.    Dispense:  118 mL    Refill:  0   Orders Placed This Encounter  Procedures   POC COVID-19     Return if symptoms worsen or fail to improve.     07-25-1990, MD   Primary Care Sports Medicine [facility]

## 2021-12-15 NOTE — Assessment & Plan Note (Addendum)
Patient with onset of symptoms 3-4 days prior, multiple family sick contacts with similar symptoms, their symptoms improved while hers persisted.  She has congestion, eye pressure, dry cough, chills and subjective fevers.  She denies any shortness of air.  Examination reveals nontoxic-appearing individual in no respiratory or distress otherwise, equal air entry noted throughout without wheezes, rales, rhonchi, benign cardiac sounds, nontender sinuses, left tympanic membrane with erythema, canal and contralateral ear benign, oropharynx benign, nasopharynx with mild erythema, no swelling.  Patient was tested and found to be COVID-positive, she is unvaccinated and has comorbid health risks, plan for molnupiravir regimen, transition of Flonase to Nasonex, as needed promethazine dextromethorphan, and other OTC and nonpharmacologic supportive care options.  I have advised patient to appropriately isolate and to remain out of work until next week, she can return to work if afebrile x48 hours.  She is to contact us towards the end of this week and early next week for any lack of adequate progress, worsening symptoms over the weekend are to be addressed in the ER.

## 2021-12-17 ENCOUNTER — Encounter: Payer: Self-pay | Admitting: Family Medicine

## 2021-12-20 ENCOUNTER — Ambulatory Visit: Payer: Self-pay | Admitting: *Deleted

## 2021-12-20 NOTE — Telephone Encounter (Signed)
    Chief Complaint: COVID positive, had visit 12/15/21. Feeling better. Taking Mucinex, has completed anti-viral. Reviewed home care Symptoms: Continued cough, fatigue Frequency: Last week Pertinent Negatives: Patient denies fever Disposition: [] ED /[] Urgent Care (no appt availability in office) / [] Appointment(In office/virtual)/ []  [facility]/ [] Home Care/ [] Refused Recommended Disposition /[] [facility]/ [x]  Follow-up with PCP Additional Notes: Will call back if symptoms worsen.  Reason for Disposition  [1] PERSISTING SYMPTOMS OF COVID-19 AND [2] symptoms BETTER (improving)  Answer Assessment - Initial Assessment Questions 1. COVID-19 ONSET: "When did the symptoms of COVID-19 first start?"     12/15/21 2. DIAGNOSIS CONFIRMATION: "How were you diagnosed?" (e.g., COVID-19 oral or nasal viral test; COVID-19 antibody test; doctor visit)     Home test 3. MAIN SYMPTOM:  "What is your main concern or symptom right now?" (e.g., breathing difficulty, cough, fatigue. loss of smell)     Cough, fatigue 4. SYMPTOM ONSET: "When did the    start?"     Last week 5. BETTER-SAME-WORSE: "Are you getting better, staying the same, or getting worse over the last 1 to 2 weeks?"     Better 6. RECENT MEDICAL VISIT: "Have you been seen by a healthcare provider (doctor, NP, PA) for these persisting COVID-19 symptoms?" If Yes, ask: "When were you seen?" (e.g., date)     Yes 7. COUGH: "Do you have a cough?" If Yes, ask: "How bad is the cough?"       Yes 8. FEVER: "Do you have a fever?" If Yes, ask: "What is your temperature, how was it measured, and when did it start?"     No 9. BREATHING DIFFICULTY: "Are you having any trouble breathing?" If Yes, ask: "How bad is your breathing?" (e.g., mild, moderate, severe)    - MILD: No SOB at rest, mild SOB with walking, speaks normally in sentences, can lie down, no retractions, pulse < 100.    - MODERATE: SOB at rest, SOB with minimal exertion and  prefers to sit, cannot lie down flat, speaks in phrases, mild retractions, audible wheezing, pulse 100-120.    - SEVERE: Very SOB at rest, speaks in single words, struggling to breathe, sitting hunched forward, retractions, pulse > 120.       Mild 10. OTHER SYMPTOMS: "Do you have any other symptoms?"  (e.g., fatigue, headache, muscle pain, weakness)       Fatigue 11. HIGH RISK DISEASE: "Do you have any chronic medical problems?" (e.g., asthma, heart or lung disease, weak immune system, obesity, etc.)       No 12. VACCINE: "Have you gotten the COVID-19 vaccine?" If Yes, ask: "Which one, how many shots, when did you get it?"       N/a 13. PREGNANCY: "Is there any chance you are pregnant?" "When was your last menstrual period?"       No 14. O2 SATURATION MONITOR:  "Do you use an oxygen saturation monitor (pulse oximeter) at home?" If Yes, ask "What is your reading (oxygen level) today?" "What is your usual oxygen saturation reading?" (e.g., 95%)       99%  Protocols used: Coronavirus (COVID-19) Persisting Symptoms Follow-up Call-[facility]

## 2021-12-20 NOTE — Telephone Encounter (Signed)
Note sent

## 2021-12-20 NOTE — Telephone Encounter (Signed)
Summary: COVID Advice   Pt is calling to report that she had an appt on 12/15/21 with positive COVID- Pt reports bad cough with congestion, fatigue, get up an feels hot.       Called patient to review sx. No answer, LVMTCB [phone].

## 2021-12-20 NOTE — Telephone Encounter (Signed)
Please advise 

## 2021-12-20 NOTE — Telephone Encounter (Signed)
Please advise, pt was DX with Covid 12/15/2021

## 2021-12-21 ENCOUNTER — Other Ambulatory Visit: Payer: Self-pay | Admitting: Family Medicine

## 2021-12-21 DIAGNOSIS — R002 Palpitations: Secondary | ICD-10-CM | POA: Diagnosis not present

## 2021-12-21 DIAGNOSIS — E782 Mixed hyperlipidemia: Secondary | ICD-10-CM | POA: Diagnosis not present

## 2021-12-21 DIAGNOSIS — G4733 Obstructive sleep apnea (adult) (pediatric): Secondary | ICD-10-CM | POA: Diagnosis not present

## 2021-12-21 DIAGNOSIS — F419 Anxiety disorder, unspecified: Secondary | ICD-10-CM | POA: Diagnosis not present

## 2021-12-21 DIAGNOSIS — Z9989 Dependence on other enabling machines and devices: Secondary | ICD-10-CM | POA: Diagnosis not present

## 2021-12-21 DIAGNOSIS — F32A Depression, unspecified: Secondary | ICD-10-CM | POA: Diagnosis not present

## 2021-12-21 DIAGNOSIS — F17290 Nicotine dependence, other tobacco product, uncomplicated: Secondary | ICD-10-CM | POA: Diagnosis not present

## 2021-12-21 DIAGNOSIS — Z6841 Body Mass Index (BMI) 40.0 and over, adult: Secondary | ICD-10-CM | POA: Diagnosis not present

## 2021-12-21 DIAGNOSIS — Z79899 Other long term (current) drug therapy: Secondary | ICD-10-CM | POA: Diagnosis not present

## 2021-12-21 DIAGNOSIS — R051 Acute cough: Secondary | ICD-10-CM

## 2021-12-21 DIAGNOSIS — K219 Gastro-esophageal reflux disease without esophagitis: Secondary | ICD-10-CM | POA: Diagnosis not present

## 2021-12-21 MED ORDER — AZITHROMYCIN 250 MG PO TABS: ORAL_TABLET | ORAL | 0 refills | Status: AC

## 2021-12-21 NOTE — Telephone Encounter (Signed)
Please advise 

## 2021-12-23 ENCOUNTER — Encounter: Payer: Self-pay | Admitting: Family Medicine

## 2021-12-24 NOTE — Telephone Encounter (Signed)
Please advise      KP 

## 2021-12-24 NOTE — Telephone Encounter (Signed)
Please advise, you have no openings today

## 2021-12-24 NOTE — Telephone Encounter (Signed)
Please review.  KP

## 2021-12-27 DIAGNOSIS — R0602 Shortness of breath: Secondary | ICD-10-CM | POA: Diagnosis not present

## 2021-12-29 ENCOUNTER — Other Ambulatory Visit: Payer: Self-pay | Admitting: Family Medicine

## 2021-12-29 DIAGNOSIS — G2581 Restless legs syndrome: Secondary | ICD-10-CM

## 2021-12-29 NOTE — Telephone Encounter (Signed)
Requested medication (s) are due for refill today: yes  Requested medication (s) are on the active medication list: yes  Last refill:  10/04/21   Future visit scheduled: yes  Notes to clinic:  due to end 01/02/22   Requested Prescriptions  Pending Prescriptions Disp Refills   rOPINIRole (REQUIP) 0.25 MG tablet [Pharmacy Med Name: ROPINIROLE 0.25MG  TABLETS] 270 tablet 0    Sig: TAKE 3 TABLETS(0.75 MG) BY MOUTH AT BEDTIME     Neurology:  Parkinsonian Agents Failed - 12/29/2021  8:09 AM      Failed - Last Heart Rate in normal range    Pulse Readings from Last 1 Encounters:  12/15/21 (!) 130         Passed - Last BP in normal range    BP Readings from Last 1 Encounters:  12/15/21 130/86         Passed - Valid encounter within last 12 months    Recent Outpatient Visits           2 weeks ago COVID   [facility] Jerrol Banana, MD   2 months ago Spondylosis of 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 or radiculopathy   [facility] Jerrol Banana, MD   4 months ago Gastroesophageal reflux disease without esophagitis   [facility] Jerrol Banana, MD   5 months ago OSA (obstructive sleep apnea)   [facility] Jerrol Banana, MD   6 months ago Acute whiplash injury, initial encounter   [facility] Jerrol Banana, MD       Future Appointments             In 6 days Ashley Royalty Ocie Bob, MD [facility]

## 2022-01-03 DIAGNOSIS — E559 Vitamin D deficiency, unspecified: Secondary | ICD-10-CM | POA: Diagnosis not present

## 2022-01-03 DIAGNOSIS — E349 Endocrine disorder, unspecified: Secondary | ICD-10-CM | POA: Diagnosis not present

## 2022-01-03 DIAGNOSIS — E538 Deficiency of other specified B group vitamins: Secondary | ICD-10-CM | POA: Diagnosis not present

## 2022-01-04 ENCOUNTER — Ambulatory Visit: Payer: BC Managed Care – PPO | Admitting: Family Medicine

## 2022-01-04 DIAGNOSIS — R0602 Shortness of breath: Secondary | ICD-10-CM | POA: Diagnosis not present

## 2022-01-04 DIAGNOSIS — E782 Mixed hyperlipidemia: Secondary | ICD-10-CM | POA: Diagnosis not present

## 2022-01-04 DIAGNOSIS — G4733 Obstructive sleep apnea (adult) (pediatric): Secondary | ICD-10-CM | POA: Diagnosis not present

## 2022-01-04 DIAGNOSIS — R002 Palpitations: Secondary | ICD-10-CM | POA: Diagnosis not present

## 2022-01-11 DIAGNOSIS — E213 Hyperparathyroidism, unspecified: Secondary | ICD-10-CM | POA: Diagnosis not present

## 2022-01-12 ENCOUNTER — Encounter: Payer: Self-pay | Admitting: Family Medicine

## 2022-01-12 ENCOUNTER — Ambulatory Visit (INDEPENDENT_AMBULATORY_CARE_PROVIDER_SITE_OTHER): Payer: BC Managed Care – PPO | Admitting: Family Medicine

## 2022-01-12 VITALS — BP 108/80 | HR 60 | Ht 69.0 in | Wt 274.0 lb

## 2022-01-12 DIAGNOSIS — G4701 Insomnia due to medical condition: Secondary | ICD-10-CM

## 2022-01-12 DIAGNOSIS — R600 Localized edema: Secondary | ICD-10-CM

## 2022-01-12 MED ORDER — FUROSEMIDE 20 MG PO TABS: 20.0000 mg | ORAL_TABLET | Freq: Every day | ORAL | 0 refills | Status: DC | PRN

## 2022-01-12 NOTE — Progress Notes (Signed)
     Primary Care / Sports Medicine Office Visit  Patient Information:  Patient ID: [name], female DOB: 09-19-1970 Age: 51 y.o. MRN: [id number]   Jamie Reeves is a pleasant 51 y.o. female presenting with the following:  Chief Complaint  Patient presents with   Joint Swelling    Ankles Started after driving to [place]- comes and goes, but gets worse after sitting for periods of time    Vitals:   01/12/22 1008  BP: 108/80  Pulse: 60  SpO2: 96%   Vitals:   01/12/22 1008  Weight: 274 lb (124.3 kg)  Height: 5\' 9"  (1.753 m)   Body mass index is 40.46 kg/m.  No results found.   Independent interpretation of notes and tests performed by another provider:   None  Procedures performed:   None  Pertinent History, Exam, Impression, and Recommendations:   Problem List Items Addressed This Visit       Other   Insomnia    Chronic issue in the setting of OSA and lumbosacral pain. She describes uneasiness in legs when supine, iron levels, B12 reassuring. Has struggled with CPAP but has been mostly compliant. Did discuss sleep medicine referra but she will hold from that until pain component addressed. This is reasonable given her stated symptoms primarily contributing to insomnia. Once lumbosacral symptoms addressed by pain management, can revisit referral to sleep medicine.      Bilateral lower extremity edema - Primary    Ongoing for several months, described as starting after a long car ride months prior, negative lower extremity dopplers for DVT and recent echocardiogram with reassuring results. She feels that today she is at her best, exam shows 2+ pulses DP, PT, 1+ pitting edema medial tibial borders bilaterally, no skin changes or varicosities visualized. Worse with periods of sitting (car rides). Did trial ankle compression socks with modest response.  May be secondary to pulmonary hypertension from OSA, excess sodium / dietary, vascular.  Discussed dietary  component of addressing her symptoms as well as CPAP compliance, additionally will trial knee-high compression socks and place a referral to vascular group for their input. Can trial Lasix 20 mg PRN over interim.      Relevant Medications   furosemide (LASIX) 20 MG tablet   Other Relevant Orders   Ambulatory referral to Vascular Surgery     Orders & Medications Meds ordered this encounter  Medications   furosemide (LASIX) 20 MG tablet    Sig: Take 1 tablet (20 mg total) by mouth daily as needed for edema.    Dispense:  30 tablet    Refill:  0   Orders Placed This Encounter  Procedures   Ambulatory referral to Vascular Surgery   [facility] COLONOSCOPY     Return in about 3 months (around 04/14/2022) for Annual Physical.     04/16/2022, MD   Primary Care Sports Medicine [facility]

## 2022-01-12 NOTE — Assessment & Plan Note (Signed)
Ongoing for several months, described as starting after a long car ride months prior, negative lower extremity dopplers for DVT and recent echocardiogram with reassuring results. She feels that today she is at her best, exam shows 2+ pulses DP, PT, 1+ pitting edema medial tibial borders bilaterally, no skin changes or varicosities visualized. Worse with periods of sitting (car rides). Did trial ankle compression socks with modest response.  May be secondary to pulmonary hypertension from OSA, excess sodium / dietary, vascular.  Discussed dietary component of addressing her symptoms as well as CPAP compliance, additionally will trial knee-high compression socks and place a referral to vascular group for their input. Can trial Lasix 20 mg PRN over interim.

## 2022-01-12 NOTE — Assessment & Plan Note (Signed)
Chronic issue in the setting of OSA and lumbosacral pain. She describes uneasiness in legs when supine, iron levels, B12 reassuring. Has struggled with CPAP but has been mostly compliant. Did discuss sleep medicine referra but she will hold from that until pain component addressed. This is reasonable given her stated symptoms primarily contributing to insomnia. Once lumbosacral symptoms addressed by pain management, can revisit referral to sleep medicine.

## 2022-01-12 NOTE — Patient Instructions (Addendum)
-   Use compression stockings (try link below for example) - Use Lasix tablet as-needed - Decrease sodium (salt) intake, can try Mrs. Dash salt-free substitute - Referral coordinator will contact to set up visit with Vein doctor - Return for physical in 3 months  https://www.google.com/url?sa=i&url=https%3A%82F%82Fwww.amazon.com%82FZippered-Compression-Socks-Open-Toe%82Fdp%82FB07NMRRXRQ&[email or web address]=[email or web address]&[email or web address]&cd=vfe&opi=[email or web address]

## 2022-01-14 DIAGNOSIS — E213 Hyperparathyroidism, unspecified: Secondary | ICD-10-CM | POA: Diagnosis not present

## 2022-01-16 NOTE — Progress Notes (Addendum)
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   Height as of this encounter: 5\' 9"  (1.753 m).   Weight as of this encounter: 267 lb (121.1 kg). WNL   Planned  Pending:  Diagnostic bilateral SI joint Blk #1    Under consideration:   Diagnostic bilateral SI joint Blk #1  Diagnostic bilateral lumbar facet MBB #1  Diagnostic right cervical facet MBB #1  Referral to neurology for EMG/PNCV of both lower extremities. Schedule patient for MRI of the cervical spine. Schedule patient for MRI of the lumbar spine.   Completed:   None at this time   Completed by other providers:   (05/12/2021 & 07/07/2021) referral to bariatric surgery x2 (currently going) by Kerri Perches, MD  (03/10/2021) referral to medical nutritionist  (did not go) by Kerri Perches, MD  (03/10/2021) referral to physical therapy (had it in [place]) by Kerri Perches, MD    Therapeutic  Palliative (PRN) options:   None established    Provider-requested follow-up: Return for procedure (Clinic): (B) SI BLK #1. Recent Visits No visits were found meeting these conditions. Showing recent visits within past 90 days and meeting all other requirements Future Appointments No visits were found meeting these conditions. Showing future appointments within next 90 days and meeting all other requirements  Primary Care Physician: Jerrol Banana, MD Note by: Oswaldo Done, MD Date: 01/17/2022; Time: 4:09 PM

## 2022-01-17 ENCOUNTER — Ambulatory Visit: Payer: BC Managed Care – PPO | Attending: Pain Medicine | Admitting: Pain Medicine

## 2022-01-17 ENCOUNTER — Encounter: Payer: Self-pay | Admitting: Pain Medicine

## 2022-01-17 VITALS — BP 124/75 | HR 95 | Temp 97.3°F | Resp 16 | Ht 69.0 in | Wt 267.0 lb

## 2022-01-17 DIAGNOSIS — M79605 Pain in left leg: Secondary | ICD-10-CM | POA: Insufficient documentation

## 2022-01-17 DIAGNOSIS — M79604 Pain in right leg: Secondary | ICD-10-CM | POA: Diagnosis not present

## 2022-01-17 DIAGNOSIS — G5793 Unspecified mononeuropathy of bilateral lower limbs: Secondary | ICD-10-CM | POA: Diagnosis not present

## 2022-01-17 DIAGNOSIS — G8929 Other chronic pain: Secondary | ICD-10-CM | POA: Insufficient documentation

## 2022-01-17 DIAGNOSIS — M25511 Pain in right shoulder: Secondary | ICD-10-CM | POA: Diagnosis not present

## 2022-01-17 DIAGNOSIS — M545 Low back pain, unspecified: Secondary | ICD-10-CM | POA: Diagnosis not present

## 2022-01-17 DIAGNOSIS — G894 Chronic pain syndrome: Secondary | ICD-10-CM | POA: Insufficient documentation

## 2022-01-17 DIAGNOSIS — Z6841 Body Mass Index (BMI) 40.0 and over, adult: Secondary | ICD-10-CM | POA: Insufficient documentation

## 2022-01-17 DIAGNOSIS — M542 Cervicalgia: Secondary | ICD-10-CM | POA: Diagnosis not present

## 2022-01-17 DIAGNOSIS — M25512 Pain in left shoulder: Secondary | ICD-10-CM | POA: Insufficient documentation

## 2022-01-17 DIAGNOSIS — M533 Sacrococcygeal disorders, not elsewhere classified: Secondary | ICD-10-CM | POA: Insufficient documentation

## 2022-01-17 NOTE — Patient Instructions (Signed)
______________________________________________________________________  Preparing for Procedure with Sedation  NOTICE: Due to recent regulatory changes, starting on December 21, 2020, procedures requiring intravenous (IV) sedation will no longer be performed at the [facility].  These types of procedures are required to be performed at [facility] ambulatory surgery facility.  We are very sorry for the inconvenience.  Procedure appointments are limited to planned procedures: No Prescription Refills. No disability issues will be discussed. No medication changes will be discussed.  Instructions: Oral Intake: Do not eat or drink anything for at least 8 hours prior to your procedure. (Exception: Blood Pressure Medication. See below.) Transportation: A driver is required. You may not drive yourself after the procedure. Blood Pressure Medicine: Do not forget to take your blood pressure medicine with a sip of water the morning of the procedure. If your Diastolic (lower reading) is above 100 mmHg, elective cases will be cancelled/rescheduled. Blood thinners: These will need to be stopped for procedures. Notify our staff if you are taking any blood thinners. Depending on which one you take, there will be specific instructions on how and when to stop it. Diabetics on insulin: Notify the staff so that you can be scheduled 1st case in the morning. If your diabetes requires high dose insulin, take only  of your normal insulin dose the morning of the procedure and notify the staff that you have done so. Preventing infections: Shower with an antibacterial soap the morning of your procedure. Build-up your immune system: Take 1000 mg of Vitamin C with every meal (3 times a day) the day prior to your procedure. Antibiotics: Inform the staff if you have a condition or reason that requires you to take antibiotics before dental procedures. Pregnancy: If you are pregnant, call and cancel the procedure. Sickness: If  you have a cold, fever, or any active infections, call and cancel the procedure. Arrival: You must be in the facility at least 30 minutes prior to your scheduled procedure. Children: Do not bring children with you. Dress appropriately: There is always the possibility that your clothing may get soiled. Valuables: Do not bring any jewelry or valuables.  Reasons to call and reschedule or cancel your procedure: (Following these recommendations will minimize the risk of a serious complication.) Surgeries: Avoid having procedures within 2 weeks of any surgery. (Avoid for 2 weeks before or after any surgery). Flu Shots: Avoid having procedures within 2 weeks of a flu shots. (Avoid for 2 weeks before or after immunizations). Barium: Avoid having a procedure within 7-10 days after having had a radiological study involving the use of radiological contrast. (Myelograms, Barium swallow or enema study). Heart attacks: Avoid any elective procedures or surgeries for the initial 6 months after a "Myocardial Infarction" (Heart Attack). Blood thinners: It is imperative that you stop these medications before procedures. Let us know if you if you take any blood thinner.  Infection: Avoid procedures during or within two weeks of an infection (including chest colds or gastrointestinal problems). Symptoms associated with infections include: Localized redness, fever, chills, night sweats or profuse sweating, burning sensation when voiding, cough, congestion, stuffiness, runny nose, sore throat, diarrhea, nausea, vomiting, cold or Flu symptoms, recent or current infections. It is specially important if the infection is over the area that we intend to treat. Heart and lung problems: Symptoms that may suggest an active cardiopulmonary problem include: cough, chest pain, breathing difficulties or shortness of breath, dizziness, ankle swelling, uncontrolled high or unusually low blood pressure, and/or palpitations. If you are    experiencing any of these symptoms, cancel your procedure and contact your primary care physician for an evaluation.  Remember:  Regular Business hours are:  Monday to Thursday 8:00 AM to 4:00 PM  Provider's Schedule: Gatlyn Lipari, MD:  Procedure days: Tuesday and Thursday 7:30 AM to 4:00 PM  Bilal Lateef, MD:  Procedure days: Monday and Wednesday 7:30 AM to 4:00 PM ______________________________________________________________________  ____________________________________________________________________________________________  General Risks and Possible Complications  Patient Responsibilities: It is important that you read this as it is part of your informed consent. It is our duty to inform you of the risks and possible complications associated with treatments offered to you. It is your responsibility as a patient to read this and to ask questions about anything that is not clear or that you believe was not covered in this document.  Patient's Rights: You have the right to refuse treatment. You also have the right to change your mind, even after initially having agreed to have the treatment done. However, under this last option, if you wait until the last second to change your mind, you may be charged for the materials used up to that point.  Introduction: Medicine is not an exact science. Everything in Medicine, including the lack of treatment(s), carries the potential for danger, harm, or loss (which is by definition: Risk). In Medicine, a complication is a secondary problem, condition, or disease that can aggravate an already existing one. All treatments carry the risk of possible complications. The fact that a side effects or complications occurs, does not imply that the treatment was conducted incorrectly. It must be clearly understood that these can happen even when everything is done following the highest safety standards.  No treatment: You can choose not to proceed with the  proposed treatment alternative. The "PRO(s)" would include: avoiding the risk of complications associated with the therapy. The "CON(s)" would include: not getting any of the treatment benefits. These benefits fall under one of three categories: diagnostic; therapeutic; and/or palliative. Diagnostic benefits include: getting information which can ultimately lead to improvement of the disease or symptom(s). Therapeutic benefits are those associated with the successful treatment of the disease. Finally, palliative benefits are those related to the decrease of the primary symptoms, without necessarily curing the condition (example: decreasing the pain from a flare-up of a chronic condition, such as incurable terminal cancer).  General Risks and Complications: These are associated to most interventional treatments. They can occur alone, or in combination. They fall under one of the following six (6) categories: no benefit or worsening of symptoms; bleeding; infection; nerve damage; allergic reactions; and/or death. No benefits or worsening of symptoms: In Medicine there are no guarantees, only probabilities. No healthcare provider can ever guarantee that a medical treatment will work, they can only state the probability that it may. Furthermore, there is always the possibility that the condition may worsen, either directly, or indirectly, as a consequence of the treatment. Bleeding: This is more common if the patient is taking a blood thinner, either prescription or over the counter (example: Goody Powders, Fish oil, Aspirin, Garlic, etc.), or if suffering a condition associated with impaired coagulation (example: Hemophilia, cirrhosis of the liver, low platelet counts, etc.). However, even if you do not have one on these, it can still happen. If you have any of these conditions, or take one of these drugs, make sure to notify your treating physician. Infection: This is more common in patients with a compromised  immune system, either due to disease (example:   diabetes, cancer, human immunodeficiency virus [HIV], etc.), or due to medications or treatments (example: therapies used to treat cancer and rheumatological diseases). However, even if you do not have one on these, it can still happen. If you have any of these conditions, or take one of these drugs, make sure to notify your treating physician. Nerve Damage: This is more common when the treatment is an invasive one, but it can also happen with the use of medications, such as those used in the treatment of cancer. The damage can occur to small secondary nerves, or to large primary ones, such as those in the spinal cord and brain. This damage may be temporary or permanent and it may lead to impairments that can range from temporary numbness to permanent paralysis and/or brain death. Allergic Reactions: Any time a substance or material comes in contact with our body, there is the possibility of an allergic reaction. These can range from a mild skin rash (contact dermatitis) to a severe systemic reaction (anaphylactic reaction), which can result in death. Death: In general, any medical intervention can result in death, most of the time due to an unforeseen complication. ____________________________________________________________________________________________  

## 2022-01-30 ENCOUNTER — Other Ambulatory Visit: Payer: Self-pay | Admitting: Family Medicine

## 2022-01-30 DIAGNOSIS — M47817 Spondylosis without myelopathy or radiculopathy, lumbosacral region: Secondary | ICD-10-CM

## 2022-02-01 ENCOUNTER — Ambulatory Visit
Admission: RE | Admit: 2022-02-01 | Discharge: 2022-02-01 | Disposition: A | Discharge status: Home / Self Care | Payer: BC Managed Care – PPO | Source: Ambulatory Visit | Attending: Pain Medicine | Admitting: Pain Medicine

## 2022-02-01 ENCOUNTER — Encounter: Payer: Self-pay | Admitting: Pain Medicine

## 2022-02-01 ENCOUNTER — Ambulatory Visit: Payer: BC Managed Care – PPO | Attending: Pain Medicine | Admitting: Pain Medicine

## 2022-02-01 VITALS — BP 105/64 | HR 92 | Temp 98.2°F | Resp 16 | Ht 69.0 in | Wt 267.0 lb

## 2022-02-01 DIAGNOSIS — Z6839 Body mass index (BMI) 39.0-39.9, adult: Secondary | ICD-10-CM | POA: Insufficient documentation

## 2022-02-01 DIAGNOSIS — M47898 Other spondylosis, sacral and sacrococcygeal region: Secondary | ICD-10-CM | POA: Insufficient documentation

## 2022-02-01 DIAGNOSIS — M461 Sacroiliitis, not elsewhere classified: Secondary | ICD-10-CM | POA: Diagnosis not present

## 2022-02-01 DIAGNOSIS — G8929 Other chronic pain: Secondary | ICD-10-CM | POA: Diagnosis not present

## 2022-02-01 DIAGNOSIS — M545 Low back pain, unspecified: Secondary | ICD-10-CM | POA: Diagnosis not present

## 2022-02-01 DIAGNOSIS — M9904 Segmental and somatic dysfunction of sacral region: Secondary | ICD-10-CM | POA: Insufficient documentation

## 2022-02-01 DIAGNOSIS — M9901 Segmental and somatic dysfunction of cervical region: Secondary | ICD-10-CM | POA: Diagnosis not present

## 2022-02-01 DIAGNOSIS — M533 Sacrococcygeal disorders, not elsewhere classified: Secondary | ICD-10-CM | POA: Insufficient documentation

## 2022-02-01 MED ORDER — PENTAFLUOROPROP-TETRAFLUOROETH EX AERO
INHALATION_SPRAY | Freq: Once | CUTANEOUS | Status: DC
  Filled 2022-02-01: qty 116

## 2022-02-01 MED ORDER — ROPIVACAINE HCL 2 MG/ML IJ SOLN
9.0000 mL | Freq: Once | INTRAMUSCULAR | Status: AC
  Administered 2022-02-01: 9 mL via INTRA_ARTICULAR
  Filled 2022-02-01: qty 20

## 2022-02-01 MED ORDER — LIDOCAINE HCL 2 % IJ SOLN
20.0000 mL | Freq: Once | INTRAMUSCULAR | Status: AC
  Administered 2022-02-01: 400 mg
  Filled 2022-02-01: qty 20

## 2022-02-01 MED ORDER — METHYLPREDNISOLONE ACETATE 80 MG/ML IJ SUSP
80.0000 mg | Freq: Once | INTRAMUSCULAR | Status: AC
  Administered 2022-02-01: 80 mg via INTRA_ARTICULAR
  Filled 2022-02-01: qty 1

## 2022-02-01 MED ORDER — LACTATED RINGERS IV SOLN: Freq: Once | INTRAVENOUS | Status: AC

## 2022-02-01 MED ORDER — MIDAZOLAM HCL 5 MG/5ML IJ SOLN
0.5000 mg | Freq: Once | INTRAMUSCULAR | Status: AC
  Administered 2022-02-01: 2 mg via INTRAVENOUS
  Filled 2022-02-01: qty 5

## 2022-02-01 NOTE — Telephone Encounter (Signed)
Requested Prescriptions  Pending Prescriptions Disp Refills  . gabapentin (NEURONTIN) 300 MG capsule [Pharmacy Med Name: GABAPENTIN 300MG  CAPSULES] 90 capsule 5    Sig: TAKE 1 CAPSULE(300 MG) BY MOUTH THREE TIMES DAILY     Neurology: Anticonvulsants - gabapentin Passed - 01/30/2022 11:46 AM      Passed - Cr in normal range and within 360 days    Creatinine, Ser  Date Value Ref Range Status  11/17/2021 0.84 0.57 - 1.00 mg/dL Final         Passed - Completed PHQ-2 or PHQ-9 in the last 360 days      Passed - Valid encounter within last 12 months    Recent Outpatient Visits          2 weeks ago Bilateral lower extremity edema   [facility] 11/19/2021, Emelia Loron, MD   1 month ago COVID   [facility], Emelia Loron, MD   4 months ago Spondylosis of 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 or radiculopathy   [place] [facility] Bob, Emelia Loron, MD   6 months ago Gastroesophageal reflux disease without esophagitis   [place] [facility], Emelia Loron, MD   6 months ago OSA (obstructive sleep apnea)   [facility] at [facility], [facility], MD      Future Appointments            In 2 months Ocie Bob, Ashley Royalty, MD [facility] at [facility], [facility]

## 2022-02-01 NOTE — Progress Notes (Signed)
PROVIDER NOTE: Interpretation of information contained herein should be left to medically-trained personnel. Specific patient instructions are provided elsewhere under "Patient Instructions" section of medical record. This document was created in part using STT-dictation technology, any transcriptional errors that may result from this process are unintentional.  Patient: Jamie Reeves Type: Established DOB: 08-31-1970 MRN: [id number] PCP: Jerrol Banana, MD  Service: Procedure DOS: 02/01/2022 Setting: Ambulatory Location: Ambulatory outpatient facility Delivery: Face-to-face Provider: Oswaldo Done, MD Specialty: Interventional Pain Management Specialty designation: 09 Location: Outpatient facility Ref. Prov.: Delano Metz, MD    Primary Reason for Visit: Interventional Pain Management Treatment. CC: Back Pain (lower)   Procedure:               Type: Sacroiliac Joint Steroid Injection #1    Laterality: Bilateral     Level: PSIS (Posterior Superior Iliac Spine)  Imaging: Fluoroscopic guidance Anesthesia: Local anesthesia (1-2% Lidocaine) Anxiolysis: None                 Sedation: No Sedation                       DOS: 02/01/2022  Performed by: Oswaldo Done, MD  Purpose: Diagnostic/Therapeutic Indications: Sacroiliac joint pain in the lower back and hip area severe enough to impact quality of life or function. Rationale (medical necessity): procedure needed and proper for the diagnosis and/or treatment of Ms. Oquin's medical symptoms and needs. 1. Chronic low back pain (1ry area of Pain) (Bilateral) (Midline) (R>L) w/o sciatica   2. Chronic sacroiliac joint pain (Bilateral)   3. Other spondylosis, sacral and sacrococcygeal region   4. Sacroiliac joint dysfunction (Bilateral)   5. Disorder of sacroiliac joints (Bilateral)   6. 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 Location: Medial to the postero-medial edge of iliac spine. Region: Lumbosacral-sacrococcygeal. Approach: Superior postero-medial percutaneous approach. Type of procedure: Percutaneous joint injection.  Position / Prep / Materials:  Position: Prone  Prep solution: DuraPrep (Iodine Povacrylex [0.7% available iodine] and Isopropyl Alcohol, 74% w/w) Prep Area: Entire posterior lumbosacral area  Materials:  Tray: Block Needle(s):  Type: Spinal  Gauge (G): 22  Length: 7-in Qty: 1  Pre-op H&P Assessment:  Ms. Milhouse is a 51 y.o. (year old), female patient, seen today for interventional treatment. She  has a past surgical history that includes Colonoscopy (2021); Wisdom tooth extraction (Bilateral, 2002); Ablation (2021); and Esophagogastroduodenoscopy (N/A, 08/23/2021). Ms. Dueitt has a current medication list which includes the following prescription(s): vitamin c, beclomethasone dipropionate, bupropion, buspirone, cholecalciferol, duloxetine, fiber, furosemide, gabapentin, loratadine, mometasone, multi-vitamin, ondansetron, pantoprazole, polyethylene glycol, ropinirole, and cyanocobalamin, and the following Facility-Administered Medications: lactated ringers and pentafluoroprop-tetrafluoroeth. Her primarily concern today is the Back Pain (lower)  Initial Vital Signs:  Pulse/HCG Rate: 97  Temp: (!) 97.3 F (36.3 C) Resp: 15 BP: 129/69 SpO2: 100 %  BMI: Estimated body mass index is 39.43 kg/m as calculated from the following:   Height as of this encounter: 5\' 9"  (1.753 m).   Weight as of this encounter: 267 lb (121.1 kg).  Risk Assessment: Allergies: Reviewed. She is allergic to tramadol.  Allergy Precautions: None required Coagulopathies: Reviewed. None identified.  Blood-thinner  therapy: None at this time Active Infection(s): Reviewed. None identified. Ms. Rocco is afebrile  Site Confirmation: Ms. Muilenburg was asked to confirm the procedure and laterality before marking the site Procedure checklist: Completed Consent: Before the procedure and under the influence of no sedative(s), amnesic(s), or anxiolytics, the patient was informed of the treatment options, risks and possible complications. To fulfill our ethical and legal obligations, as recommended by the American Medical Association's Code of Ethics, I have informed the patient of my clinical impression; the nature and purpose of the treatment or procedure; the risks, benefits, and possible complications of the intervention; the alternatives, including doing nothing; the risk(s) and benefit(s) of the alternative treatment(s) or procedure(s); and the risk(s) and benefit(s) of doing nothing. The patient was provided information about the general risks and possible complications associated with the procedure. These may include, but are not limited to: failure to achieve desired goals, infection, bleeding, organ or nerve damage, allergic reactions, paralysis, and death. In addition, the patient was informed of those risks and complications associated to the procedure, such as failure to decrease pain; infection; bleeding; organ or nerve damage with subsequent damage to sensory, motor, and/or autonomic systems, resulting in permanent pain, numbness, and/or weakness of one or several areas of the body; allergic reactions; (i.e.: anaphylactic reaction); and/or death. Furthermore, the patient was informed of those risks and complications associated with the medications. These include, but are not limited to: allergic reactions (i.e.: anaphylactic or anaphylactoid reaction(s)); adrenal axis suppression; blood sugar elevation that in diabetics may result in ketoacidosis or comma; water retention that in patients with history of  congestive heart failure may result in shortness of breath, pulmonary edema, and decompensation with resultant heart failure; weight gain; swelling or edema; medication-induced neural toxicity; particulate matter embolism and blood vessel occlusion with resultant organ, and/or nervous system infarction; and/or aseptic necrosis of one or more joints. Finally, the patient was informed that Medicine is not an exact science; therefore, there is also the possibility of unforeseen or unpredictable risks and/or possible complications that may result in a catastrophic outcome. The patient indicated having understood very clearly. We have given the patient no guarantees and we have made no promises. Enough time was given to the patient to ask questions, all of which were answered to the patient's satisfaction. Ms. Garretson has indicated that she wanted to continue with the procedure. Attestation: I, the ordering provider, attest that I have discussed with the patient the benefits, risks, side-effects, alternatives, likelihood of achieving goals, and potential problems during recovery for the procedure that I have provided informed consent. Date  Time: 02/01/2022  9:18 AM  Pre-Procedure Preparation:  Monitoring: As per clinic protocol. Respiration, ETCO2, SpO2, BP, heart rate and rhythm monitor placed and checked for adequate function Safety Precautions: Patient was assessed for positional comfort and pressure points before starting the procedure. Time-out: I initiated and conducted the "Time-out" before starting the procedure, as per protocol. The patient was asked to participate by confirming the accuracy of the "Time Out" information. Verification of the correct person, site, and procedure were performed and confirmed by me, the nursing staff, and the patient. "Time-out" conducted as per Joint Commission's Universal Protocol (UP.01.01.01). Time: 0954  Description/Narrative of Procedure:          Rationale  (medical necessity): procedure needed and proper for the diagnosis and/or treatment of the patient's medical symptoms and needs. Procedural Technique Safety Precautions: Aspiration looking for blood return was conducted prior to all injections.  At no point did we inject any substances, as a needle was being advanced. No attempts were made at seeking any paresthesias. Safe injection practices and needle disposal techniques used. Medications properly checked for expiration dates. SDV (single dose vial) medications used. Description of the Procedure: Protocol guidelines were followed. The patient was assisted into a comfortable position. The target area was identified and the area prepped in the usual manner. Skin & deeper tissues infiltrated with local anesthetic. Appropriate amount of time allowed to pass for local anesthetics to take effect. The procedure needles were then advanced to the target area. Proper needle placement secured. Negative aspiration confirmed. Solution injected in intermittent fashion, asking for systemic symptoms every 0.5cc of injectate. The needles were then removed and the area cleansed, making sure to leave some of the prepping solution back to take advantage of its long term bactericidal properties.  Technical description of procedure:  Fluoroscopy using a posterior anterior 45 degree angle from the midline aiming at the anterolateral aspect of the patient was used to find a direct path into the sacroiliac joint, the superior medial to posterior superior iliac spine.  The skin was marked where the desired target and the skin infiltrated with local anesthetics.  The procedure needle was then advanced until the joint was entered.  Once inside of the joint, we then proceeded to inject the desired solution.  Vitals:   02/01/22 0950 02/01/22 0955 02/01/22 1000 02/01/22 1008  BP: 126/67 120/82 (!) 116/59 105/64  Pulse: 96 96 99 92  Resp: 15 15 16 16   Temp:    98.2 F (36.8 C)   SpO2: 99% 99% 100% 99%  Weight:      Height:        Start Time: 0954 hrs. End Time: 1000 hrs.  Imaging Guidance (Non-Spinal):          Type of Imaging Technique: Fluoroscopy Guidance (Non-Spinal) Indication(s): Assistance in needle guidance and placement for procedures requiring needle placement in or near specific anatomical locations not easily accessible without such assistance. Exposure Time: Please see nurses notes. Contrast: None used. Fluoroscopic Guidance: I was personally present during the use of fluoroscopy. "Tunnel Vision Technique" used to obtain the best possible view of the target area. Parallax error corrected before commencing the procedure. "Direction-depth-direction" technique used to introduce the needle under continuous pulsed fluoroscopy. Once target was reached, antero-posterior, oblique, and lateral fluoroscopic projection used confirm needle placement in all planes. Images permanently stored in EMR. Interpretation: No contrast injected. I personally interpreted the imaging intraoperatively. Adequate needle placement confirmed in multiple planes. Permanent images saved into the patient's record.  Post-operative Assessment:  Post-procedure Vital Signs:  Pulse/HCG Rate: 92  Temp: 98.2 F (36.8 C) Resp: 16 BP: 105/64 SpO2: 99 %  EBL: None  Complications: No immediate post-treatment complications observed by team, or reported by patient.  Note: The patient tolerated the entire procedure well. A repeat set of vitals were taken after the procedure and the patient was kept under observation following institutional policy, for this type of procedure. Post-procedural neurological assessment was performed, showing return to baseline, prior to discharge. The patient was provided with post-procedure discharge instructions, including a section on how to identify potential problems. Should any problems arise concerning this procedure, the patient was given instructions to  immediately contact us, at any time, without hesitation. In any case, we plan to contact the patient by telephone for a follow-up status report regarding this interventional procedure.  Comments:  No additional relevant information.  Plan of Care  Orders:  Orders Placed This Encounter  Procedures   SACROILIAC JOINT INJECTION    Scheduling Instructions:     Side: Bilateral     Sedation: Patient's choice.     Timeframe: Today    Order Specific Question:   Where will this procedure be performed?    Answer:   [facility]   [facility] C-ARM 1-60 MIN NO REPORT    Intraoperative interpretation by procedural physician at [facility].    Standing Status:   Standing    Number of Occurrences:   1    Order Specific Question:   Reason for exam:    Answer:   Assistance in needle guidance and placement for procedures requiring needle placement in or near specific anatomical locations not easily accessible without such assistance.   Informed Consent Details: Physician/Practitioner Attestation; Transcribe to consent form and obtain patient signature    Nursing Order: Transcribe to consent form and obtain patient signature. Note: Always confirm laterality of pain with Ms. Coberly, before procedure.    Order Specific Question:   Physician/Practitioner attestation of informed consent for procedure/surgical case    Answer:   I, the physician/practitioner, attest that I have discussed with the patient the benefits, risks, side effects, alternatives, likelihood of achieving goals and potential problems during recovery for the procedure that I have provided informed consent.    Order Specific Question:   Procedure    Answer:   Sacroiliac Joint Block    Order Specific Question:   Physician/Practitioner performing the procedure    Answer:   Pryor Guettler A. Laban Emperor, MD    Order Specific Question:   Indication/Reason    Answer:   Chronic Low Back and Hip Pain secondary to Sacroiliac Joint  Pain (Arthralgia/Arthropathy)   Provide equipment / supplies at bedside    "Block Tray" (Disposable  single use) Needle type: SpinalSpinal Amount/quantity: 2 Size: Medium (5-inch) Gauge: 22G    Standing Status:   Standing    Number of Occurrences:   1    Order Specific Question:   Specify    Answer:   Block Tray   Chronic Opioid Analgesic:  None MME/day: 0 mg/day   Medications ordered for procedure: Meds ordered this encounter  Medications   lidocaine (XYLOCAINE) 2 % (with pres) injection 400 mg   pentafluoroprop-tetrafluoroeth (GEBAUERS) aerosol   lactated ringers infusion   midazolam (VERSED) 5 MG/5ML injection 0.5-2 mg    Make sure Flumazenil is available in the pyxis when using this medication. If oversedation occurs, administer 0.2 mg IV over 15 sec. If after 45 sec no response, administer 0.2 mg again over 1 min; may repeat at 1 min intervals; not to exceed 4 doses (1 mg)   ropivacaine (PF) 2 mg/mL (0.2%) (NAROPIN) injection 9 mL   methylPREDNISolone acetate (DEPO-MEDROL) injection 80 mg   Medications administered: We administered lidocaine, lactated ringers, midazolam, ropivacaine (PF) 2 mg/mL (0.2%), and methylPREDNISolone acetate.  See the medical record for exact dosing, route, and time of administration.  Follow-up plan:   Return in about 2 weeks (around 02/15/2022) for Eval-day (M,W), (F2F), (PPE).       Interventional Therapies  Risk  Complexity Considerations:   Estimated body mass index is 39.43 kg/m as calculated from the following:   Height as of this encounter: 5\' 9"  (1.753 m).   Weight as of this encounter: 267 lb (121.1 kg). WNL   Planned  Pending:   Diagnostic bilateral SI joint Blk #  1    Under consideration:   Diagnostic bilateral SI joint Blk #1  Diagnostic bilateral lumbar facet MBB #1  Diagnostic right cervical facet MBB #1  Referral to neurology for EMG/PNCV of both lower extremities. Schedule patient for MRI of the cervical  spine. Schedule patient for MRI of the lumbar spine.   Completed:   None at this time   Completed by other providers:   (05/12/2021 & 07/07/2021) referral to bariatric surgery x2 (currently going) by Kerri Perches, MD  (03/10/2021) referral to medical nutritionist (did not go) by Kerri Perches, MD  (03/10/2021) referral to physical therapy (had it in [place]) by Kerri Perches, MD    Therapeutic  Palliative (PRN) options:   None established     Recent Visits Date Type Provider Dept  01/17/22 Office Visit Delano Metz, MD [facility]  11/17/21 Office Visit Delano Metz, MD [facility]  Showing recent visits within past 90 days and meeting all other requirements Today's Visits Date Type Provider Dept  02/01/22 Procedure visit Delano Metz, MD [facility]  Showing today's visits and meeting all other requirements Future Appointments Date Type Provider Dept  02/15/22 Appointment Delano Metz, MD [facility]  Showing future appointments within next 90 days and meeting all other requirements  Disposition: Discharge home  Discharge (Date  Time): 02/01/2022; 1010 hrs.   Primary Care Physician: Jerrol Banana, MD Location: [facility] Note by: Oswaldo Done, MD Date: 02/01/2022; Time: 10:11 AM  Disclaimer:  Medicine is not an exact science. The only guarantee in medicine is that nothing is guaranteed. It is important to note that the decision to proceed with this intervention was based on the information collected from the patient. The Data and conclusions were drawn from the patient's questionnaire, the interview, and the physical examination. Because the information was provided in large part by the patient, it cannot be guaranteed that it has not been purposely or unconsciously manipulated. Every effort has been made to obtain as much relevant data as possible for this evaluation. It is  important to note that the conclusions that lead to this procedure are derived in large part from the available data. Always take into account that the treatment will also be dependent on availability of resources and existing treatment guidelines, considered by other Pain Management Practitioners as being common knowledge and practice, at the time of the intervention. For Medico-Legal purposes, it is also important to point out that variation in procedural techniques and pharmacological choices are the acceptable norm. The indications, contraindications, technique, and results of the above procedure should only be interpreted and judged by a Board-Certified Interventional Pain Specialist with extensive familiarity and expertise in the same exact procedure and technique.

## 2022-02-01 NOTE — Patient Instructions (Signed)
______________________________________________________________________________________________  Body mass index (BMI)  Body mass index (BMI) is a common tool for deciding whether a person has an appropriate body weight.  It measures a persons weight in relation to their height.   According to the [facility] of health (NIH): A BMI of less than 18.5 means that a person is underweight. A BMI of between 18.5 and 24.9 is ideal. A BMI of between 25 and 29.9 is overweight. A BMI over 30 indicates obesity.  Weight Management Required  URGENT: Your weight has been found to be adversely affecting your health.  Dear Ms. Traweek:  Your current Estimated body mass index is 39.43 kg/m as calculated from the following:   Height as of 01/17/22: _0  (1.753 m).   Weight as of 01/17/22: 267 lb (121.1 kg).  Please use the table below to identify your weight category and associated incidence of chronic pain, secondary to your weight.  Body Mass Index (BMI) Classification BMI level (kg/m2) Category Associated incidence of chronic pain  <18  Underweight   18.5-24.9 Ideal body weight   25-29.9 Overweight  20%  30-34.9 Obese (Class I)  68%  35-39.9 Severe obesity (Class II)  136%  >40 Extreme obesity (Class III)  254%   In addition: You will be considered "Morbidly Obese", if your BMI is above 30 and you have one or more of the following conditions which are known to be caused and/or directly associated with obesity: 1.    Type 2 Diabetes (Which in turn can lead to cardiovascular diseases (CVD), stroke, peripheral vascular diseases (PVD), retinopathy, nephropathy, and neuropathy) 2.    Cardiovascular Disease (High Blood Pressure; Congestive Heart Failure; High Cholesterol; Coronary Artery Disease; Angina; or History of Heart Attacks) 3.    Breathing problems (Asthma; obesity-hypoventilation syndrome; obstructive sleep apnea; chronic inflammatory airway disease; reactive airway disease; or  shortness of breath) 4.    Chronic kidney disease 5.    Liver disease (nonalcoholic fatty liver disease) 6.    High blood pressure 7.    Acid reflux (gastroesophageal reflux disease; heartburn) 8.    Osteoarthritis (OA) (with any of the following: hip pain; knee pain; and/or low back pain) 9.    Low back pain (Lumbar Facet Syndrome; and/or Degenerative Disc Disease) 10.  Hip pain (Osteoarthritis of hip) (For every 1 lbs of added body weight, there is a 2 lbs increase in pressure inside of each hip articulation. 1:2 mechanical relationship) 11.  Knee pain (Osteoarthritis of knee) (For every 1 lbs of added body weight, there is a 4 lbs increase in pressure inside of each knee articulation. 1:4 mechanical relationship) (patients with a BMI>30 kg/m2 were 6.8 times more likely to develop knee OA than normal-weight individuals) 12.  Cancer: Epidemiological studies have shown that obesity is a risk factor for: post-menopausal breast cancer; cancers of the endometrium, colon and kidney cancer; malignant adenomas of the oesophagus. Obese subjects have an approximately 1.5-3.5-fold increased risk of developing these cancers compared with normal-weight subjects, and it has been estimated that between 15 and 45% of these cancers can be attributed to overweight. More recent studies suggest that obesity may also increase the risk of other types of cancer, including pancreatic, hepatic and gallbladder cancer. (Ref: Obesity and cancer. Pischon T, Nthlings U, [employer] H. Proc Nutr Soc. 2008 May;67(2):128-45. doi: 09.3267/T2458099833825053.) The International Agency for Research on Cancer (IARC) has identified 13 cancers associated with overweight and obesity: meningioma, multiple myeloma, adenocarcinoma of the esophagus, and cancers of the thyroid,  postmenopausal breast cancer, gallbladder, stomach, liver, pancreas, kidney, ovaries, uterus, colon and rectal (colorectal) cancers. 90 percent of all cancers diagnosed in women  and 24 percent of those diagnosed in men are associated with overweight and obesity.  Recommendation: At this point it is urgent that you take a step back and concentrate in loosing weight. Dedicate 100% of your efforts on this task. Nothing else will improve your health more than bringing your weight down and your BMI to less than 30. If you are here, you probably have chronic pain. We know that most chronic pain patients have difficulty exercising secondary to their pain. For this reason, you must rely on proper nutrition and diet in order to lose the weight. If your BMI is above 40, you should seriously consider bariatric surgery. A realistic goal is to lose 10% of your body weight over a period of 12 months.  Be honest to yourself, if over time you have unsuccessfully tried to lose weight, then it is time for you to seek professional help and to enter a medically supervised weight management program, and/or undergo bariatric surgery. Stop procrastinating.   Pain management considerations:  1.    Pharmacological Problems: Be advised that the use of opioid analgesics (oxycodone; hydrocodone; morphine; methadone; codeine; and all of their derivatives) have been associated with decreased metabolism and weight gain.  For this reason, should we see that you are unable to lose weight while taking these medications, it may become necessary for [place] to taper down and indefinitely discontinue them.  2.    Technical Problems: The incidence of successful interventional therapies decreases as the patient's BMI increases. It is much more difficult to accomplish a safe and effective interventional therapy on a patient with a BMI above 35. 3.    Radiation Exposure Problems: The x-rays machine, used to accomplish injection therapies, will automatically increase their x-ray output in order to capture an appropriate bone image. This means that radiation exposure increases exponentially with the patient's BMI. (The higher the  BMI, the higher the radiation exposure.) Although the level of radiation used at a given time is still safe to the patient, it is not for the physician and/or assisting staff. Unfortunately, radiation exposure is accumulative. Because physicians and the staff have to do procedures and be exposed on a daily basis, this can result in health problems such as cancer and radiation burns. Radiation exposure to the staff is monitored by the radiation batches that they wear. The exposure levels are reported back to the staff on a quarterly basis. Depending on levels of exposure, physicians and staff may be obligated by law to decrease this exposure. This means that they have the right and obligation to refuse providing therapies where they may be overexposed to radiation. For this reason, physicians may decline to offer therapies such as radiofrequency ablation or implants to patients with a BMI above 40. 4.    Current Trends: Be advised that the current trend is to no longer offer certain therapies to patients with a BMI equal to, or above 35, due to increase perioperative risks, increased technical procedural difficulties, and excessive radiation exposure to healthcare personnel.  ______________________________________________________________________________________________   ____________________________________________________________________________________________  Post-Procedure Discharge Instructions  Instructions: Apply ice:  Purpose: This will minimize any swelling and discomfort after procedure.  When: Day of procedure, as soon as you get home. How: Fill a plastic sandwich bag with crushed ice. Cover it with a small towel and apply to injection site. How long: (15  min on, 15 min off) Apply for 15 minutes then remove x 15 minutes.  Repeat sequence on day of procedure, until you go to bed. Apply heat:  Purpose: To treat any soreness and discomfort from the procedure. When: Starting the next day  after the procedure. How: Apply heat to procedure site starting the day following the procedure. How long: May continue to repeat daily, until discomfort goes away. Food intake: Start with clear liquids (like water) and advance to regular food, as tolerated.  Physical activities: Keep activities to a minimum for the first 8 hours after the procedure. After that, then as tolerated. Driving: If you have received any sedation, be responsible and do not drive. You are not allowed to drive for 24 hours after having sedation. Blood thinner: (Applies only to those taking blood thinners) You may restart your blood thinner 6 hours after your procedure. Insulin: (Applies only to Diabetic patients taking insulin) As soon as you can eat, you may resume your normal dosing schedule. Infection prevention: Keep procedure site clean and dry. Shower daily and clean area with soap and water. Post-procedure Pain Diary: Extremely important that this be done correctly and accurately. Recorded information will be used to determine the next step in treatment. For the purpose of accuracy, follow these rules: Evaluate only the area treated. Do not report or include pain from an untreated area. For the purpose of this evaluation, ignore all other areas of pain, except for the treated area. After your procedure, avoid taking a long nap and attempting to complete the pain diary after you wake up. Instead, set your alarm clock to go off every hour, on the hour, for the initial 8 hours after the procedure. Document the duration of the numbing medicine, and the relief you are getting from it. Do not go to sleep and attempt to complete it later. It will not be accurate. If you received sedation, it is likely that you were given a medication that may cause amnesia. Because of this, completing the diary at a later time may cause the information to be inaccurate. This information is needed to plan your care. Follow-up appointment: Keep  your post-procedure follow-up evaluation appointment after the procedure (usually 2 weeks for most procedures, 6 weeks for radiofrequencies). DO NOT FORGET to bring you pain diary with you.   Expect: (What should I expect to see with my procedure?) From numbing medicine (AKA: Local Anesthetics): Numbness or decrease in pain. You may also experience some weakness, which if present, could last for the duration of the local anesthetic. Onset: Full effect within 15 minutes of injected. Duration: It will depend on the type of local anesthetic used. On the average, 1 to 8 hours.  From steroids (Applies only if steroids were used): Decrease in swelling or inflammation. Once inflammation is improved, relief of the pain will follow. Onset of benefits: Depends on the amount of swelling present. The more swelling, the longer it will take for the benefits to be seen. In some cases, up to 10 days. Duration: Steroids will stay in the system x 2 weeks. Duration of benefits will depend on multiple posibilities including persistent irritating factors. Side-effects: If present, they may typically last 2 weeks (the duration of the steroids). Frequent: Cramps (if they occur, drink Gatorade and take over-the-counter Magnesium 450-500 mg once to twice a day); water retention with temporary weight gain; increases in blood sugar; decreased immune system response; increased appetite. Occasional: Facial flushing (red, warm cheeks); mood swings; menstrual  changes. Uncommon: Long-term decrease or suppression of natural hormones; bone thinning. (These are more common with higher doses or more frequent use. This is why we prefer that our patients avoid having any injection therapies in other practices.)  Very Rare: Severe mood changes; psychosis; aseptic necrosis. From procedure: Some discomfort is to be expected once the numbing medicine wears off. This should be minimal if ice and heat are applied as instructed.  Call if: (When  should I call?) You experience numbness and weakness that gets worse with time, as opposed to wearing off. New onset bowel or bladder incontinence. (Applies only to procedures done in the spine)  Emergency Numbers: Durning business hours (Monday - Thursday, 8:00 AM - 4:00 PM) (Friday, 9:00 AM - 12:00 Noon): [phone] After hours: [phone] NOTE: If you are having a problem and are unable connect with, or to talk to a provider, then go to your nearest urgent care or emergency department. If the problem is serious and urgent, please call 911. ____________________________________________________________________________________________

## 2022-02-01 NOTE — Progress Notes (Signed)
Safety precautions to be maintained throughout the outpatient stay will include: orient to surroundings, keep bed in low position, maintain call bell within reach at all times, provide assistance with transfer out of bed and ambulation.  

## 2022-02-02 ENCOUNTER — Telehealth: Payer: Self-pay

## 2022-02-02 ENCOUNTER — Telehealth: Payer: Self-pay | Admitting: Pain Medicine

## 2022-02-02 NOTE — Telephone Encounter (Signed)
Patient reassured, advised her to read the discharge instructions provided. Also advised to document everything on the post procedure diary. She had not completed any of the diary as of now, I informed her of the importance of doing so in evaluation of the procedure.

## 2022-02-02 NOTE — Telephone Encounter (Signed)
PT stated that she is having some pain since the procedure on yesterday. PT wanted to know is this normal. Please give patient a call. Thanks

## 2022-02-02 NOTE — Telephone Encounter (Signed)
Post procedure phone call.  LM 

## 2022-02-08 DIAGNOSIS — G2581 Restless legs syndrome: Secondary | ICD-10-CM | POA: Diagnosis not present

## 2022-02-08 DIAGNOSIS — J301 Allergic rhinitis due to pollen: Secondary | ICD-10-CM | POA: Diagnosis not present

## 2022-02-08 DIAGNOSIS — G4733 Obstructive sleep apnea (adult) (pediatric): Secondary | ICD-10-CM | POA: Diagnosis not present

## 2022-02-10 ENCOUNTER — Encounter: Payer: Self-pay | Admitting: Family Medicine

## 2022-02-10 ENCOUNTER — Ambulatory Visit (INDEPENDENT_AMBULATORY_CARE_PROVIDER_SITE_OTHER): Payer: BC Managed Care – PPO | Admitting: Family Medicine

## 2022-02-10 VITALS — BP 128/78 | HR 68 | Ht 69.0 in | Wt 275.0 lb

## 2022-02-10 DIAGNOSIS — E538 Deficiency of other specified B group vitamins: Secondary | ICD-10-CM

## 2022-02-10 DIAGNOSIS — Z7689 Persons encountering health services in other specified circumstances: Secondary | ICD-10-CM | POA: Diagnosis not present

## 2022-02-10 MED ORDER — CYANOCOBALAMIN 1000 MCG/ML IJ SOLN
1000.0000 ug | Freq: Once | INTRAMUSCULAR | Status: AC
  Administered 2022-02-10: 1000 ug via INTRAMUSCULAR

## 2022-02-10 MED ORDER — PHENTERMINE HCL 15 MG PO CAPS: 15.0000 mg | ORAL_CAPSULE | ORAL | 0 refills | Status: DC

## 2022-02-10 MED ORDER — TOPIRAMATE 25 MG PO TABS: ORAL_TABLET | ORAL | 0 refills | Status: DC

## 2022-02-10 NOTE — Patient Instructions (Signed)
-   Dose phentermine and topiramate daily - Review information provided about healthy lifestyle changes - Obtain lab 1-2 days prior to follow-up - Return for follow-up in 1 month, contact for questions between now and then

## 2022-02-10 NOTE — Progress Notes (Signed)
     Primary Care / Sports Medicine Office Visit  Patient Information:  Patient ID: Angeliah Wisdom, female DOB: 12-Jun-1970 Age: 51 y.o. MRN: [id number]   Jamie Reeves is a pleasant 51 y.o. female presenting with the following:  Chief Complaint  Patient presents with   Weight Loss    Martin Majestic to bariatrics clinic was told insurance wouldn't pay for surgery.     Vitals:   02/10/22 1427  BP: 128/78  Pulse: 68  SpO2: 98%   Vitals:   02/10/22 1427  Weight: 275 lb (124.7 kg)  Height: 5\' 9"  (1.753 m)   Body mass index is 40.61 kg/m.  DG PAIN CLINIC C-ARM 1-60 MIN NO REPORT  Result Date: 02/01/2022 Fluoro was used, but no Radiologist interpretation will be provided. Please refer to "NOTES" tab for provider progress note.    Independent interpretation of notes and tests performed by another provider:   None  Procedures performed:   None  Pertinent History, Exam, Impression, and Recommendations:   Problem List Items Addressed This Visit       Other   Encounter for weight management - Primary    Chronic issue, had recently been through bariatric surgery intake but due to insurance barriers was unable to proceed with this.  Patient is interested in alternative weight loss strategies.  We discussed the importance of generating a caloric deficit, diet, exercise, and lifestyle changes.  From a pharmacotherapeutic standpoint we will initiate phentermine and topiramate at 50 mg and 25 mg respectively, did caution adverse effect profile and to be vigilant about noting and minimizing risk.  Patient will return in 1 month for reevaluation to assess tolerance and note weight.      Relevant Medications   topiramate (TOPAMAX) 25 MG tablet   phentermine 15 MG capsule   Other Relevant Orders   Vitamin B12   Other Visit Diagnoses     B12 deficiency       Relevant Medications   cyanocobalamin (VITAMIN B12) injection 1,000 mcg (Completed)   Other Relevant Orders   Vitamin B12         Orders & Medications Meds ordered this encounter  Medications   topiramate (TOPAMAX) 25 MG tablet    Sig: One half tab p.o. nightly for 1 week then 1 tab p.o. nightly for 1 week then 1 tab p.o. twice daily    Dispense:  30 tablet    Refill:  0   phentermine 15 MG capsule    Sig: Take 1 capsule (15 mg total) by mouth every morning.    Dispense:  30 capsule    Refill:  0   cyanocobalamin (VITAMIN B12) injection 1,000 mcg   Orders Placed This Encounter  Procedures   Vitamin B12     Return in about 4 weeks (around 03/10/2022) for weight management.     Montel Culver, MD   Primary Care Sports Medicine [place]

## 2022-02-10 NOTE — Assessment & Plan Note (Signed)
Chronic issue, had recently been through bariatric surgery intake but due to insurance barriers was unable to proceed with this.  Patient is interested in alternative weight loss strategies.  We discussed the importance of generating a caloric deficit, diet, exercise, and lifestyle changes.  From a pharmacotherapeutic standpoint we will initiate phentermine and topiramate at 50 mg and 25 mg respectively, did caution adverse effect profile and to be vigilant about noting and minimizing risk.  Patient will return in 1 month for reevaluation to assess tolerance and note weight.

## 2022-02-15 ENCOUNTER — Encounter: Payer: Self-pay | Admitting: Pain Medicine

## 2022-02-15 ENCOUNTER — Ambulatory Visit: Payer: BC Managed Care – PPO | Attending: Pain Medicine | Admitting: Pain Medicine

## 2022-02-15 VITALS — BP 124/81 | HR 105 | Temp 97.2°F | Resp 99 | Ht 69.0 in | Wt 275.0 lb

## 2022-02-15 DIAGNOSIS — M545 Low back pain, unspecified: Secondary | ICD-10-CM | POA: Insufficient documentation

## 2022-02-15 DIAGNOSIS — M47816 Spondylosis without myelopathy or radiculopathy, lumbar region: Secondary | ICD-10-CM | POA: Insufficient documentation

## 2022-02-15 DIAGNOSIS — M533 Sacrococcygeal disorders, not elsewhere classified: Secondary | ICD-10-CM | POA: Diagnosis not present

## 2022-02-15 DIAGNOSIS — G894 Chronic pain syndrome: Secondary | ICD-10-CM | POA: Diagnosis not present

## 2022-02-15 DIAGNOSIS — G8929 Other chronic pain: Secondary | ICD-10-CM | POA: Diagnosis not present

## 2022-02-15 NOTE — Patient Instructions (Signed)
______________________________________________________________________  Preparing for Procedure with Sedation  NOTICE: Due to recent regulatory changes, starting on December 21, 2020, procedures requiring intravenous (IV) sedation will no longer be performed at the [facility].  These types of procedures are required to be performed at [facility] ambulatory surgery facility.  We are very sorry for the inconvenience.  Procedure appointments are limited to planned procedures: No Prescription Refills. No disability issues will be discussed. No medication changes will be discussed.  Instructions: Oral Intake: Do not eat or drink anything for at least 8 hours prior to your procedure. (Exception: Blood Pressure Medication. See below.) Transportation: A driver is required. You may not drive yourself after the procedure. Blood Pressure Medicine: Do not forget to take your blood pressure medicine with a sip of water the morning of the procedure. If your Diastolic (lower reading) is above 100 mmHg, elective cases will be cancelled/rescheduled. Blood thinners: These will need to be stopped for procedures. Notify our staff if you are taking any blood thinners. Depending on which one you take, there will be specific instructions on how and when to stop it. Diabetics on insulin: Notify the staff so that you can be scheduled 1st case in the morning. If your diabetes requires high dose insulin, take only  of your normal insulin dose the morning of the procedure and notify the staff that you have done so. Preventing infections: Shower with an antibacterial soap the morning of your procedure. Build-up your immune system: Take 1000 mg of Vitamin C with every meal (3 times a day) the day prior to your procedure. Antibiotics: Inform the staff if you have a condition or reason that requires you to take antibiotics before dental procedures. Pregnancy: If you are pregnant, call and cancel the procedure. Sickness: If  you have a cold, fever, or any active infections, call and cancel the procedure. Arrival: You must be in the facility at least 30 minutes prior to your scheduled procedure. Children: Do not bring children with you. Dress appropriately: There is always the possibility that your clothing may get soiled. Valuables: Do not bring any jewelry or valuables.  Reasons to call and reschedule or cancel your procedure: (Following these recommendations will minimize the risk of a serious complication.) Surgeries: Avoid having procedures within 2 weeks of any surgery. (Avoid for 2 weeks before or after any surgery). Flu Shots: Avoid having procedures within 2 weeks of a flu shots. (Avoid for 2 weeks before or after immunizations). Barium: Avoid having a procedure within 7-10 days after having had a radiological study involving the use of radiological contrast. (Myelograms, Barium swallow or enema study). Heart attacks: Avoid any elective procedures or surgeries for the initial 6 months after a "Myocardial Infarction" (Heart Attack). Blood thinners: It is imperative that you stop these medications before procedures. Let us know if you if you take any blood thinner.  Infection: Avoid procedures during or within two weeks of an infection (including chest colds or gastrointestinal problems). Symptoms associated with infections include: Localized redness, fever, chills, night sweats or profuse sweating, burning sensation when voiding, cough, congestion, stuffiness, runny nose, sore throat, diarrhea, nausea, vomiting, cold or Flu symptoms, recent or current infections. It is specially important if the infection is over the area that we intend to treat. Heart and lung problems: Symptoms that may suggest an active cardiopulmonary problem include: cough, chest pain, breathing difficulties or shortness of breath, dizziness, ankle swelling, uncontrolled high or unusually low blood pressure, and/or palpitations. If you are    experiencing any of these symptoms, cancel your procedure and contact your primary care physician for an evaluation.  Remember:  Regular Business hours are:  Monday to Thursday 8:00 AM to 4:00 PM  Provider's Schedule: Taheera Thomann, MD:  Procedure days: Tuesday and Thursday 7:30 AM to 4:00 PM  Bilal Lateef, MD:  Procedure days: Monday and Wednesday 7:30 AM to 4:00 PM ______________________________________________________________________  ____________________________________________________________________________________________  General Risks and Possible Complications  Patient Responsibilities: It is important that you read this as it is part of your informed consent. It is our duty to inform you of the risks and possible complications associated with treatments offered to you. It is your responsibility as a patient to read this and to ask questions about anything that is not clear or that you believe was not covered in this document.  Patient's Rights: You have the right to refuse treatment. You also have the right to change your mind, even after initially having agreed to have the treatment done. However, under this last option, if you wait until the last second to change your mind, you may be charged for the materials used up to that point.  Introduction: Medicine is not an exact science. Everything in Medicine, including the lack of treatment(s), carries the potential for danger, harm, or loss (which is by definition: Risk). In Medicine, a complication is a secondary problem, condition, or disease that can aggravate an already existing one. All treatments carry the risk of possible complications. The fact that a side effects or complications occurs, does not imply that the treatment was conducted incorrectly. It must be clearly understood that these can happen even when everything is done following the highest safety standards.  No treatment: You can choose not to proceed with the  proposed treatment alternative. The "PRO(s)" would include: avoiding the risk of complications associated with the therapy. The "CON(s)" would include: not getting any of the treatment benefits. These benefits fall under one of three categories: diagnostic; therapeutic; and/or palliative. Diagnostic benefits include: getting information which can ultimately lead to improvement of the disease or symptom(s). Therapeutic benefits are those associated with the successful treatment of the disease. Finally, palliative benefits are those related to the decrease of the primary symptoms, without necessarily curing the condition (example: decreasing the pain from a flare-up of a chronic condition, such as incurable terminal cancer).  General Risks and Complications: These are associated to most interventional treatments. They can occur alone, or in combination. They fall under one of the following six (6) categories: no benefit or worsening of symptoms; bleeding; infection; nerve damage; allergic reactions; and/or death. No benefits or worsening of symptoms: In Medicine there are no guarantees, only probabilities. No healthcare provider can ever guarantee that a medical treatment will work, they can only state the probability that it may. Furthermore, there is always the possibility that the condition may worsen, either directly, or indirectly, as a consequence of the treatment. Bleeding: This is more common if the patient is taking a blood thinner, either prescription or over the counter (example: Goody Powders, Fish oil, Aspirin, Garlic, etc.), or if suffering a condition associated with impaired coagulation (example: Hemophilia, cirrhosis of the liver, low platelet counts, etc.). However, even if you do not have one on these, it can still happen. If you have any of these conditions, or take one of these drugs, make sure to notify your treating physician. Infection: This is more common in patients with a compromised  immune system, either due to disease (example:   diabetes, cancer, human immunodeficiency virus [HIV], etc.), or due to medications or treatments (example: therapies used to treat cancer and rheumatological diseases). However, even if you do not have one on these, it can still happen. If you have any of these conditions, or take one of these drugs, make sure to notify your treating physician. Nerve Damage: This is more common when the treatment is an invasive one, but it can also happen with the use of medications, such as those used in the treatment of cancer. The damage can occur to small secondary nerves, or to large primary ones, such as those in the spinal cord and brain. This damage may be temporary or permanent and it may lead to impairments that can range from temporary numbness to permanent paralysis and/or brain death. Allergic Reactions: Any time a substance or material comes in contact with our body, there is the possibility of an allergic reaction. These can range from a mild skin rash (contact dermatitis) to a severe systemic reaction (anaphylactic reaction), which can result in death. Death: In general, any medical intervention can result in death, most of the time due to an unforeseen complication. ____________________________________________________________________________________________  
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
## 2022-02-22 ENCOUNTER — Ambulatory Visit (INDEPENDENT_AMBULATORY_CARE_PROVIDER_SITE_OTHER): Payer: BC Managed Care – PPO | Admitting: Vascular Surgery

## 2022-02-22 ENCOUNTER — Encounter (INDEPENDENT_AMBULATORY_CARE_PROVIDER_SITE_OTHER): Payer: Self-pay | Admitting: Vascular Surgery

## 2022-02-22 VITALS — BP 120/81 | HR 105 | Resp 18 | Ht 69.0 in | Wt 275.0 lb

## 2022-02-22 DIAGNOSIS — G8929 Other chronic pain: Secondary | ICD-10-CM

## 2022-02-22 DIAGNOSIS — M545 Low back pain, unspecified: Secondary | ICD-10-CM

## 2022-02-22 DIAGNOSIS — E782 Mixed hyperlipidemia: Secondary | ICD-10-CM

## 2022-02-22 DIAGNOSIS — E66812 Obesity, class 2: Secondary | ICD-10-CM

## 2022-02-22 DIAGNOSIS — M7989 Other specified soft tissue disorders: Secondary | ICD-10-CM | POA: Diagnosis not present
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
## 2022-02-23 DIAGNOSIS — M7989 Other specified soft tissue disorders: Secondary | ICD-10-CM | POA: Insufficient documentation

## 2022-02-23 NOTE — Assessment & Plan Note (Signed)
lipid control important in reducing the progression of atherosclerotic disease.   

## 2022-02-23 NOTE — Assessment & Plan Note (Signed)
Can worsen leg swelling and weight loss would be helpful in reducing lower extremity swelling.

## 2022-02-23 NOTE — Assessment & Plan Note (Signed)

## 2022-02-23 NOTE — Assessment & Plan Note (Signed)
Neurogenic issues may worsen lower extremity symptoms as well.

## 2022-02-27 NOTE — Progress Notes (Unsigned)
PROVIDER NOTE: Interpretation of information contained herein should be left to medically-trained personnel. Specific patient instructions are provided elsewhere under "Patient Instructions" section of medical record. This document was created in part using STT-dictation technology, any transcriptional errors that may result from this process are unintentional.  Patient: Jamie Reeves Type: Established DOB: [birth year] MRN: [id number] PCP: Jerrol Banana, MD  Service: Procedure DOS: 03/01/2022 Setting: Ambulatory Location: Ambulatory outpatient facility Delivery: Face-to-face Provider: Oswaldo Done, MD Specialty: Interventional Pain Management Specialty designation: 09 Location: Outpatient facility Ref. Prov.: Jerrol Banana, MD    Procedure:           Type: Lumbar Facet, Medial Branch Block(s) #1  Laterality: Bilateral  Level: L2, L3, L4, L5, & S1 Medial Branch Level(s). Injecting these levels blocks the L3-4, L4-5 and L5-S1 lumbar facet joints. Imaging: Fluoroscopic guidance         Anesthesia: Local anesthesia (1-2% Lidocaine) Anxiolysis: IV Versed 2.0 mg Sedation: Moderate Sedation Fentanyl 1 mL (50 mcg) DOS: 03/01/2022 Performed by: Oswaldo Done, MD  Primary Purpose: Diagnostic/Therapeutic Indications: Low back pain severe enough to impact quality of life or function. 1. Lumbar facet syndrome (Bilateral)   2. Spondylosis of 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 or radiculopathy   3. DDD (degenerative disc disease), lumbar   4. Chronic low back pain (1ry area of Pain) (Bilateral) (Midline) (R>L) w/o sciatica   5. Morbid obesity with BMI of 40.0-44.9, adult (HCC)    NAS-11 Pain score:   Pre-procedure: 0-No pain/10   Post-procedure: 0-No pain/10     Position / Prep / Materials:  Position: Prone  Prep solution: DuraPrep (Iodine Povacrylex [0.7% available iodine] and Isopropyl Alcohol, 74% w/w) Area Prepped: Posterolateral Lumbosacral Spine (Wide prep:  From the lower border of the scapula down to the end of the tailbone and from flank to flank.)  Materials:  Tray: Block Needle(s):  Type: Spinal  Gauge (G): 22  Length: 5-in Qty: 4     Pre-op H&P Assessment:  Jamie Reeves is a 51 y.o. (year old), female patient, seen today for interventional treatment. She  has a past surgical history that includes Colonoscopy (2021); Wisdom tooth extraction (Bilateral, 2002); Ablation (2021); and Esophagogastroduodenoscopy (N/A, 08/23/2021). Jamie Reeves has a current medication list which includes the following prescription(s): vitamin c, beclomethasone dipropionate, bupropion, buspirone, cholecalciferol, duloxetine, fiber, furosemide, gabapentin, loratadine, mometasone, multi-vitamin, ondansetron, pantoprazole, phentermine, polyethylene glycol, ropinirole, topiramate, and cyanocobalamin, and the following Facility-Administered Medications: fentanyl. Her primarily concern today is the Back Pain (lower)  Initial Vital Signs:  Pulse/HCG Rate: 100ECG Heart Rate: 77 (NSR) Temp: (!) 97.3 F (36.3 C) Resp: 18 BP: 111/63 SpO2: 100 %  BMI: Estimated body mass index is 39.87 kg/m as calculated from the following:   Height as of this encounter: 5\' 9"  (1.753 m).   Weight as of this encounter: 270 lb (122.5 kg).  Risk Assessment: Allergies: Reviewed. She is allergic to tramadol.  Allergy Precautions: None required Coagulopathies: Reviewed. None identified.  Blood-thinner therapy: None at this time Active Infection(s): Reviewed. None identified. Jamie Reeves is afebrile  Site Confirmation: Jamie Reeves was asked to confirm the procedure and laterality before marking the site Procedure checklist: Completed Consent: Before the procedure and under the influence of no sedative(s), amnesic(s), or anxiolytics, the patient was informed of the treatment options, risks and possible complications. To fulfill our ethical and legal obligations, as recommended by the  American Medical Association's Code of Ethics, I have informed the patient of my clinical impression; the nature  and purpose of the treatment or procedure; the risks, benefits, and possible complications of the intervention; the alternatives, including doing nothing; the risk(s) and benefit(s) of the alternative treatment(s) or procedure(s); and the risk(s) and benefit(s) of doing nothing. The patient was provided information about the general risks and possible complications associated with the procedure. These may include, but are not limited to: failure to achieve desired goals, infection, bleeding, organ or nerve damage, allergic reactions, paralysis, and death. In addition, the patient was informed of those risks and complications associated to Spine-related procedures, such as failure to decrease pain; infection (i.e.: Meningitis, epidural or intraspinal abscess); bleeding (i.e.: epidural hematoma, subarachnoid hemorrhage, or any other type of intraspinal or peri-dural bleeding); organ or nerve damage (i.e.: Any type of peripheral nerve, nerve root, or spinal cord injury) with subsequent damage to sensory, motor, and/or autonomic systems, resulting in permanent pain, numbness, and/or weakness of one or several areas of the body; allergic reactions; (i.e.: anaphylactic reaction); and/or death. Furthermore, the patient was informed of those risks and complications associated with the medications. These include, but are not limited to: allergic reactions (i.e.: anaphylactic or anaphylactoid reaction(s)); adrenal axis suppression; blood sugar elevation that in diabetics may result in ketoacidosis or comma; water retention that in patients with history of congestive heart failure may result in shortness of breath, pulmonary edema, and decompensation with resultant heart failure; weight gain; swelling or edema; medication-induced neural toxicity; particulate matter embolism and blood vessel occlusion with  resultant organ, and/or nervous system infarction; and/or aseptic necrosis of one or more joints. Finally, the patient was informed that Medicine is not an exact science; therefore, there is also the possibility of unforeseen or unpredictable risks and/or possible complications that may result in a catastrophic outcome. The patient indicated having understood very clearly. We have given the patient no guarantees and we have made no promises. Enough time was given to the patient to ask questions, all of which were answered to the patient's satisfaction. Ms. Claude has indicated that she wanted to continue with the procedure. Attestation: I, the ordering provider, attest that I have discussed with the patient the benefits, risks, side-effects, alternatives, likelihood of achieving goals, and potential problems during recovery for the procedure that I have provided informed consent. Date  Time: 03/01/2022  8:38 AM  Pre-Procedure Preparation:  Monitoring: As per clinic protocol. Respiration, ETCO2, SpO2, BP, heart rate and rhythm monitor placed and checked for adequate function Safety Precautions: Patient was assessed for positional comfort and pressure points before starting the procedure. Time-out: I initiated and conducted the "Time-out" before starting the procedure, as per protocol. The patient was asked to participate by confirming the accuracy of the "Time Out" information. Verification of the correct person, site, and procedure were performed and confirmed by me, the nursing staff, and the patient. "Time-out" conducted as per Joint Commission's Universal Protocol (UP.01.01.01). Time: 0949  Description of Procedure:          Laterality: Bilateral. The procedure was performed in identical fashion on both sides. Targeted Levels:  L2, L3, L4, L5, & S1 Medial Branch Level(s)  Safety Precautions: Aspiration looking for blood return was conducted prior to all injections. At no point did we inject any  substances, as a needle was being advanced. Before injecting, the patient was told to immediately notify me if she was experiencing any new onset of "ringing in the ears, or metallic taste in the mouth". No attempts were made at seeking any paresthesias. Safe injection practices and  needle disposal techniques used. Medications properly checked for expiration dates. SDV (single dose vial) medications used. After the completion of the procedure, all disposable equipment used was discarded in the proper designated medical waste containers. Local Anesthesia: Protocol guidelines were followed. The patient was positioned over the fluoroscopy table. The area was prepped in the usual manner. The time-out was completed. The target area was identified using fluoroscopy. A 12-in long, straight, sterile hemostat was used with fluoroscopic guidance to locate the targets for each level blocked. Once located, the skin was marked with an approved surgical skin marker. Once all sites were marked, the skin (epidermis, dermis, and hypodermis), as well as deeper tissues (fat, connective tissue and muscle) were infiltrated with a small amount of a short-acting local anesthetic, loaded on a 10cc syringe with a 25G, 1.5-in  Needle. An appropriate amount of time was allowed for local anesthetics to take effect before proceeding to the next step. Local Anesthetic: Lidocaine 2.0% The unused portion of the local anesthetic was discarded in the proper designated containers. Technical description of process:  L2 Medial Branch Nerve Block (MBB): The target area for the L2 medial branch is at the junction of the postero-lateral aspect of the superior articular process and the superior, posterior, and medial edge of the transverse process of L3. Under fluoroscopic guidance, a Quincke needle was inserted until contact was made with os over the superior postero-lateral aspect of the pedicular shadow (target area). After negative aspiration for  blood, 0.5 mL of the nerve block solution was injected without difficulty or complication. The needle was removed intact. L3 Medial Branch Nerve Block (MBB): The target area for the L3 medial branch is at the junction of the postero-lateral aspect of the superior articular process and the superior, posterior, and medial edge of the transverse process of L4. Under fluoroscopic guidance, a Quincke needle was inserted until contact was made with os over the superior postero-lateral aspect of the pedicular shadow (target area). After negative aspiration for blood, 0.5 mL of the nerve block solution was injected without difficulty or complication. The needle was removed intact. L4 Medial Branch Nerve Block (MBB): The target area for the L4 medial branch is at the junction of the postero-lateral aspect of the superior articular process and the superior, posterior, and medial edge of the transverse process of L5. Under fluoroscopic guidance, a Quincke needle was inserted until contact was made with os over the superior postero-lateral aspect of the pedicular shadow (target area). After negative aspiration for blood, 0.5 mL of the nerve block solution was injected without difficulty or complication. The needle was removed intact. L5 Medial Branch Nerve Block (MBB): The target area for the L5 medial branch is at the junction of the postero-lateral aspect of the superior articular process and the superior, posterior, and medial edge of the sacral ala. Under fluoroscopic guidance, a Quincke needle was inserted until contact was made with os over the superior postero-lateral aspect of the pedicular shadow (target area). After negative aspiration for blood, 0.5 mL of the nerve block solution was injected without difficulty or complication. The needle was removed intact. S1 Medial Branch Nerve Block (MBB): The target area for the S1 medial branch is at the posterior and inferior 6 o'clock position of the L5-S1 facet joint.  Under fluoroscopic guidance, the Quincke needle inserted for the L5 MBB was redirected until contact was made with os over the inferior and postero aspect of the sacrum, at the 6 o' clock position under the  L5-S1 facet joint (Target area). After negative aspiration for blood, 0.5 mL of the nerve block solution was injected without difficulty or complication. The needle was removed intact.  Once the entire procedure was completed, the treated area was cleaned, making sure to leave some of the prepping solution back to take advantage of its long term bactericidal properties.         Illustration of the posterior view of the lumbar spine and the posterior neural structures. Laminae of L2 through S1 are labeled. DPRL5, dorsal primary ramus of L5; DPRS1, dorsal primary ramus of S1; DPR3, dorsal primary ramus of L3; FJ, facet (zygapophyseal) joint L3-L4; I, inferior articular process of L4; LB1, lateral branch of dorsal primary ramus of L1; IAB, inferior articular branches from L3 medial branch (supplies L4-L5 facet joint); IBP, intermediate branch plexus; MB3, medial branch of dorsal primary ramus of L3; NR3, third lumbar nerve root; S, superior articular process of L5; SAB, superior articular branches from L4 (supplies L4-5 facet joint also); TP3, transverse process of L3.  Vitals:   03/01/22 0954 03/01/22 1001 03/01/22 1010 03/01/22 1021  BP: 109/65 109/72 122/67 106/70  Pulse:      Resp: Temp:   (!) 97.3 F (36.3 C)   SpO2: 100% 100% 98% 97%  Weight:      Height:         Start Time: 0949 hrs. End Time: 1000 hrs.  Imaging Guidance (Spinal):          Type of Imaging Technique: Fluoroscopy Guidance (Spinal) Indication(s): Assistance in needle guidance and placement for procedures requiring needle placement in or near specific anatomical locations not easily accessible without such assistance. Exposure Time: Please see nurses notes. Contrast: None used. Fluoroscopic Guidance: I  was personally present during the use of fluoroscopy. "Tunnel Vision Technique" used to obtain the best possible view of the target area. Parallax error corrected before commencing the procedure. "Direction-depth-direction" technique used to introduce the needle under continuous pulsed fluoroscopy. Once target was reached, antero-posterior, oblique, and lateral fluoroscopic projection used confirm needle placement in all planes. Images permanently stored in EMR. Interpretation: No contrast injected. I personally interpreted the imaging intraoperatively. Adequate needle placement confirmed in multiple planes. Permanent images saved into the patient's record.  Antibiotic Prophylaxis:   Anti-infectives (From admission, onward)    None      Indication(s): None identified  Post-operative Assessment:  Post-procedure Vital Signs:  Pulse/HCG Rate: 10075 Temp: (!) 97.3 F (36.3 C) Resp: 13 BP: 106/70 SpO2: 97 %  EBL: None  Complications: No immediate post-treatment complications observed by team, or reported by patient.  Note: The patient tolerated the entire procedure well. A repeat set of vitals were taken after the procedure and the patient was kept under observation following institutional policy, for this type of procedure. Post-procedural neurological assessment was performed, showing return to baseline, prior to discharge. The patient was provided with post-procedure discharge instructions, including a section on how to identify potential problems. Should any problems arise concerning this procedure, the patient was given instructions to immediately contact us, at any time, without hesitation. In any case, we plan to contact the patient by telephone for a follow-up status report regarding this interventional procedure.  Comments:  No additional relevant information.  Plan of Care  Orders:  Orders Placed This Encounter  Procedures   LUMBAR FACET(MEDIAL BRANCH NERVE BLOCK) MBNB     Scheduling Instructions:     Procedure: Lumbar facet block (AKA.: Lumbosacral  medial branch nerve block)     Side: Bilateral     Level: L3-4 & L5-S1 Facets (L2, L3, L4, L5, & S1 Medial Branch Nerves)     Sedation: Patient's choice.     Timeframe: Today    Order Specific Question:   Where will this procedure be performed?    Answer:   [facility]   DG [facility] C-ARM 1-60 MIN NO REPORT    Intraoperative interpretation by procedural physician at [facility].    Standing Status:   Standing    Number of Occurrences:   1    Order Specific Question:   Reason for exam:    Answer:   Assistance in needle guidance and placement for procedures requiring needle placement in or near specific anatomical locations not easily accessible without such assistance.   Informed Consent Details: Physician/Practitioner Attestation; Transcribe to consent form and obtain patient signature    Nursing Order: Transcribe to consent form and obtain patient signature. Note: Always confirm laterality of pain with Ms. Fuentes, before procedure.    Order Specific Question:   Physician/Practitioner attestation of informed consent for procedure/surgical case    Answer:   I, the physician/practitioner, attest that I have discussed with the patient the benefits, risks, side effects, alternatives, likelihood of achieving goals and potential problems during recovery for the procedure that I have provided informed consent.    Order Specific Question:   Procedure    Answer:   Lumbar Facet Block  under fluoroscopic guidance    Order Specific Question:   Physician/Practitioner performing the procedure    Answer:   Jasslyn Finkel A. Laban Emperor MD    Order Specific Question:   Indication/Reason    Answer:   Low Back Pain, with our without leg pain, due to Facet Joint Arthralgia (Joint Pain) Spondylosis (Arthritis of the Spine), without myelopathy or radiculopathy (Nerve Damage).   Provide equipment / supplies at bedside     "Block Tray" (Disposable  single use) Needle type: SpinalSpinal Amount/quantity: 4 Size: Medium (5-inch) Gauge: 22G    Standing Status:   Standing    Number of Occurrences:   1    Order Specific Question:   Specify    Answer:   Block Tray   Chronic Opioid Analgesic:  None MME/day: 0 mg/day   Medications ordered for procedure: Meds ordered this encounter  Medications   lidocaine (XYLOCAINE) 2 % (with pres) injection 400 mg   pentafluoroprop-tetrafluoroeth (GEBAUERS) aerosol   lactated ringers infusion   midazolam (VERSED) 5 MG/5ML injection 0.5-2 mg    Make sure Flumazenil is available in the pyxis when using this medication. If oversedation occurs, administer 0.2 mg IV over 15 sec. If after 45 sec no response, administer 0.2 mg again over 1 min; may repeat at 1 min intervals; not to exceed 4 doses (1 mg)   fentaNYL (SUBLIMAZE) injection 25-50 mcg    Make sure Narcan is available in the pyxis when using this medication. In the event of respiratory depression (RR< 8/min): Titrate NARCAN (naloxone) in increments of 0.1 to 0.2 mg IV at 2-3 minute intervals, until desired degree of reversal.   ropivacaine (PF) 2 mg/mL (0.2%) (NAROPIN) injection 18 mL   triamcinolone acetonide (KENALOG-40) injection 80 mg   Medications administered: We administered lidocaine, pentafluoroprop-tetrafluoroeth, lactated ringers, midazolam, fentaNYL, ropivacaine (PF) 2 mg/mL (0.2%), and triamcinolone acetonide.  See the medical record for exact dosing, route, and time of administration.  Follow-up plan:   Return in about 2  weeks (around 03/15/2022) for Proc-day (T,Th), (F2F), (MM).       Interventional Therapies  Risk  Complexity Considerations:   Estimated body mass index is 39.43 kg/m as calculated from the following:   Height as of this encounter: 5\' 9"  (1.753 m).   Weight as of this encounter: 267 lb (121.1 kg). WNL   Planned  Pending:   Diagnostic bilateral lumbar facet MBB #1    Under  consideration:   Diagnostic bilateral SI joint Blk #2  Diagnostic bilateral lumbar facet MBB #1  Diagnostic right cervical facet MBB #1  Referral to neurology for EMG/PNCV of both lower extremities. Schedule patient for MRI of the cervical spine. Schedule patient for MRI of the lumbar spine.   Completed:   Diagnostic/therapeutic bilateral SI joint Blk x1 (02/01/2022) (100/100/50/50)    Completed by other providers:   (05/12/2021 & 07/07/2021) referral to bariatric surgery x2 (currently going) by Kerri PerchesJason Mathews, MD  (03/10/2021) referral to medical nutritionist (did not go) by Kerri PerchesJason Mathews, MD  (03/10/2021) referral to physical therapy (had it in [place]) by Kerri PerchesJason Mathews, MD    Therapeutic  Palliative (PRN) options:   None established    Recent Visits Date Type Provider Dept  02/15/22 Office Visit Delano MetzNaveira, Grae Leathers, MD [facility]  02/01/22 Procedure visit Delano MetzNaveira, Ashia Dehner, MD [facility]  01/17/22 Office Visit Delano MetzNaveira, Meliya Mcconahy, MD [facility]  Showing recent visits within past 90 days and meeting all other requirements Today's Visits Date Type Provider Dept  03/01/22 Procedure visit Delano MetzNaveira, Allyah Heather, MD [facility]  Showing today's visits and meeting all other requirements Future Appointments Date Type Provider Dept  03/15/22 Appointment Delano MetzNaveira, Jheremy Boger, MD [facility]  Showing future appointments within next 90 days and meeting all other requirements  Disposition: Discharge home  Discharge (Date  Time): 03/01/2022; 1030 hrs.   Primary Care Physician: Jerrol BananaMatthews, Jason J, MD [facility] Note by: Oswaldo DoneFrancisco A Raywood Wailes, MD Date: 03/01/2022; Time: 12:03 PM  Disclaimer:  Medicine is not an Visual merchandiserexact science. The only guarantee in medicine is that nothing is guaranteed. It is important to note that the decision to proceed with this intervention was based on the information collected from  the patient. The Data and conclusions were drawn from the patient's questionnaire, the interview, and the physical examination. Because the information was provided in large part by the patient, it cannot be guaranteed that it has not been purposely or unconsciously manipulated. Every effort has been made to obtain as much relevant data as possible for this evaluation. It is important to note that the conclusions that lead to this procedure are derived in large part from the available data. Always take into account that the treatment will also be dependent on availability of resources and existing treatment guidelines, considered by other Pain Management Practitioners as being common knowledge and practice, at the time of the intervention. For Medico-Legal purposes, it is also important to point out that variation in procedural techniques and pharmacological choices are the acceptable norm. The indications, contraindications, technique, and results of the above procedure should only be interpreted and judged by a Board-Certified Interventional Pain Specialist with extensive familiarity and expertise in the same exact procedure and technique.

## 2022-03-01 ENCOUNTER — Encounter: Payer: Self-pay | Admitting: Pain Medicine

## 2022-03-01 ENCOUNTER — Ambulatory Visit: Payer: BC Managed Care – PPO | Admitting: Pain Medicine

## 2022-03-01 ENCOUNTER — Ambulatory Visit
Admission: RE | Admit: 2022-03-01 | Discharge: 2022-03-01 | Disposition: A | Discharge status: Home / Self Care | Payer: BC Managed Care – PPO | Source: Ambulatory Visit | Attending: Pain Medicine | Admitting: Pain Medicine

## 2022-03-01 ENCOUNTER — Ambulatory Visit: Payer: BC Managed Care – PPO | Attending: Pain Medicine | Admitting: Pain Medicine

## 2022-03-01 VITALS — BP 106/70 | HR 100 | Temp 97.3°F | Resp 13 | Ht 69.0 in | Wt 270.0 lb

## 2022-03-01 DIAGNOSIS — Z6841 Body Mass Index (BMI) 40.0 and over, adult: Secondary | ICD-10-CM | POA: Diagnosis not present

## 2022-03-01 DIAGNOSIS — M5136 Other intervertebral disc degeneration, lumbar region: Secondary | ICD-10-CM | POA: Diagnosis not present

## 2022-03-01 DIAGNOSIS — M545 Low back pain, unspecified: Secondary | ICD-10-CM | POA: Insufficient documentation

## 2022-03-01 DIAGNOSIS — M47817 Spondylosis without myelopathy or radiculopathy, lumbosacral region: Secondary | ICD-10-CM | POA: Insufficient documentation

## 2022-03-01 DIAGNOSIS — M47816 Spondylosis without myelopathy or radiculopathy, lumbar region: Secondary | ICD-10-CM

## 2022-03-01 DIAGNOSIS — G8929 Other chronic pain: Secondary | ICD-10-CM | POA: Insufficient documentation

## 2022-03-01 MED ORDER — LACTATED RINGERS IV SOLN: Freq: Once | INTRAVENOUS | Status: AC

## 2022-03-01 MED ORDER — FENTANYL CITRATE (PF) 100 MCG/2ML IJ SOLN
INTRAMUSCULAR | Status: AC
  Filled 2022-03-01: qty 2

## 2022-03-01 MED ORDER — PENTAFLUOROPROP-TETRAFLUOROETH EX AERO
INHALATION_SPRAY | Freq: Once | CUTANEOUS | Status: AC
  Administered 2022-03-01: 30 via TOPICAL
  Filled 2022-03-01: qty 116

## 2022-03-01 MED ORDER — TRIAMCINOLONE ACETONIDE 40 MG/ML IJ SUSP
80.0000 mg | Freq: Once | INTRAMUSCULAR | Status: AC
  Administered 2022-03-01: 80 mg

## 2022-03-01 MED ORDER — ROPIVACAINE HCL 2 MG/ML IJ SOLN
INTRAMUSCULAR | Status: AC
  Filled 2022-03-01: qty 20

## 2022-03-01 MED ORDER — LIDOCAINE HCL 2 % IJ SOLN
INTRAMUSCULAR | Status: AC
  Filled 2022-03-01: qty 20

## 2022-03-01 MED ORDER — FENTANYL CITRATE (PF) 100 MCG/2ML IJ SOLN
25.0000 ug | INTRAMUSCULAR | Status: DC | PRN
  Administered 2022-03-01: 50 ug via INTRAVENOUS

## 2022-03-01 MED ORDER — ROPIVACAINE HCL 2 MG/ML IJ SOLN
18.0000 mL | Freq: Once | INTRAMUSCULAR | Status: AC
  Administered 2022-03-01: 18 mL via PERINEURAL

## 2022-03-01 MED ORDER — MIDAZOLAM HCL 5 MG/5ML IJ SOLN
INTRAMUSCULAR | Status: AC
  Filled 2022-03-01: qty 5

## 2022-03-01 MED ORDER — MIDAZOLAM HCL 5 MG/5ML IJ SOLN
0.5000 mg | Freq: Once | INTRAMUSCULAR | Status: AC
  Administered 2022-03-01: 2 mg via INTRAVENOUS

## 2022-03-01 MED ORDER — LIDOCAINE HCL 2 % IJ SOLN
20.0000 mL | Freq: Once | INTRAMUSCULAR | Status: AC
  Administered 2022-03-01: 400 mg

## 2022-03-01 MED ORDER — TRIAMCINOLONE ACETONIDE 40 MG/ML IJ SUSP
INTRAMUSCULAR | Status: AC
  Filled 2022-03-01: qty 2

## 2022-03-01 NOTE — Patient Instructions (Signed)

## 2022-03-01 NOTE — Progress Notes (Signed)
Safety precautions to be maintained throughout the outpatient stay will include: orient to surroundings, keep bed in low position, maintain call bell within reach at all times, provide assistance with transfer out of bed and ambulation.  

## 2022-03-02 ENCOUNTER — Telehealth: Payer: Self-pay | Admitting: *Deleted

## 2022-03-02 NOTE — Telephone Encounter (Signed)
No problems post procedure. 

## 2022-03-04 ENCOUNTER — Other Ambulatory Visit: Payer: Self-pay | Admitting: Family Medicine

## 2022-03-04 ENCOUNTER — Other Ambulatory Visit: Payer: Self-pay

## 2022-03-04 DIAGNOSIS — Z1231 Encounter for screening mammogram for malignant neoplasm of breast: Secondary | ICD-10-CM

## 2022-03-04 DIAGNOSIS — M47817 Spondylosis without myelopathy or radiculopathy, lumbosacral region: Secondary | ICD-10-CM

## 2022-03-04 MED ORDER — DULOXETINE HCL 60 MG PO CPEP: 60.0000 mg | ORAL_CAPSULE | Freq: Every day | ORAL | 0 refills | Status: DC

## 2022-03-04 NOTE — Telephone Encounter (Signed)
Refilled 03/04/2022 #[id number] rf. Requested Prescriptions  Pending Prescriptions Disp Refills  . DULoxetine (CYMBALTA) 60 MG capsule [Pharmacy Med Name: DULOXETINE DR 60MG CAPSULES] 90 capsule 0    Sig: TAKE 1 CAPSULE(60 MG) BY MOUTH DAILY     Psychiatry: Antidepressants - SNRI - duloxetine Passed - 03/04/2022  8:18 AM      Passed - Cr in normal range and within 360 days    Creatinine, Ser  Date Value Ref Range Status  11/17/2021 0.84 0.57 - 1.00 mg/dL Final         Passed - eGFR is 30 or above and within 360 days    eGFR  Date Value Ref Range Status  11/17/2021 85 >59 mL/min/1.73 Final         Passed - Completed PHQ-2 or PHQ-9 in the last 360 days      Passed - Last BP in normal range    BP Readings from Last 1 Encounters:  03/01/22 106/70         Passed - Valid encounter within last 6 months    Recent Outpatient Visits          3 weeks ago Encounter for weight management   [facility] [place], Earley Abide, MD   1 month ago Bilateral lower extremity edema   [place] [facility] at [place], Earley Abide, MD   2 months ago [place] [facility] at [place], Earley Abide, MD   5 months ago Spondylosis of 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 or radiculopathy   [place] [facility] at [place], Earley Abide, MD   7 months ago Gastroesophageal reflux disease without esophagitis   [place] [facility] at [facility], Earley Abide, MD      Future Appointments            In 1 week Zigmund Daniel, Earley Abide, MD [facility] at [facility], [facility]   In 1 month Zigmund Daniel, Earley Abide, MD [place] [facility] at [facility], [facility]

## 2022-03-08 ENCOUNTER — Ambulatory Visit (INDEPENDENT_AMBULATORY_CARE_PROVIDER_SITE_OTHER): Payer: BC Managed Care – PPO | Admitting: Vascular Surgery

## 2022-03-08 ENCOUNTER — Encounter (INDEPENDENT_AMBULATORY_CARE_PROVIDER_SITE_OTHER): Payer: BC Managed Care – PPO | Admitting: Vascular Surgery

## 2022-03-08 ENCOUNTER — Ambulatory Visit (INDEPENDENT_AMBULATORY_CARE_PROVIDER_SITE_OTHER): Payer: BC Managed Care – PPO | Admitting: Nurse Practitioner

## 2022-03-08 ENCOUNTER — Ambulatory Visit (INDEPENDENT_AMBULATORY_CARE_PROVIDER_SITE_OTHER): Payer: BC Managed Care – PPO

## 2022-03-08 ENCOUNTER — Encounter (INDEPENDENT_AMBULATORY_CARE_PROVIDER_SITE_OTHER): Payer: Self-pay | Admitting: Nurse Practitioner

## 2022-03-08 VITALS — BP 129/82 | HR 103 | Resp 17 | Ht 69.0 in | Wt 272.8 lb

## 2022-03-08 DIAGNOSIS — M7989 Other specified soft tissue disorders: Secondary | ICD-10-CM | POA: Diagnosis not present

## 2022-03-08 DIAGNOSIS — M545 Low back pain, unspecified: Secondary | ICD-10-CM | POA: Diagnosis not present

## 2022-03-08 DIAGNOSIS — E782 Mixed hyperlipidemia: Secondary | ICD-10-CM | POA: Diagnosis not present

## 2022-03-08 DIAGNOSIS — G8929 Other chronic pain: Secondary | ICD-10-CM

## 2022-03-09 ENCOUNTER — Ambulatory Visit (INDEPENDENT_AMBULATORY_CARE_PROVIDER_SITE_OTHER): Payer: BC Managed Care – PPO | Admitting: Nurse Practitioner

## 2022-03-12 ENCOUNTER — Other Ambulatory Visit: Payer: Self-pay | Admitting: Family Medicine

## 2022-03-12 DIAGNOSIS — Z7689 Persons encountering health services in other specified circumstances: Secondary | ICD-10-CM

## 2022-03-14 ENCOUNTER — Encounter (INDEPENDENT_AMBULATORY_CARE_PROVIDER_SITE_OTHER): Payer: Self-pay | Admitting: Nurse Practitioner

## 2022-03-14 ENCOUNTER — Ambulatory Visit (INDEPENDENT_AMBULATORY_CARE_PROVIDER_SITE_OTHER): Payer: BC Managed Care – PPO | Admitting: Family Medicine

## 2022-03-14 ENCOUNTER — Encounter: Payer: Self-pay | Admitting: Family Medicine

## 2022-03-14 VITALS — BP 122/80 | HR 98 | Ht 69.0 in | Wt 272.0 lb

## 2022-03-14 DIAGNOSIS — Z7689 Persons encountering health services in other specified circumstances: Secondary | ICD-10-CM

## 2022-03-14 DIAGNOSIS — E538 Deficiency of other specified B group vitamins: Secondary | ICD-10-CM | POA: Diagnosis not present

## 2022-03-14 DIAGNOSIS — F419 Anxiety disorder, unspecified: Secondary | ICD-10-CM | POA: Diagnosis not present

## 2022-03-14 MED ORDER — PHENTERMINE HCL 30 MG PO CAPS: 30.0000 mg | ORAL_CAPSULE | ORAL | 0 refills | Status: DC

## 2022-03-14 MED ORDER — TOPIRAMATE 50 MG PO TABS: 50.0000 mg | ORAL_TABLET | Freq: Every day | ORAL | 0 refills | Status: DC

## 2022-03-14 NOTE — Progress Notes (Signed)
     Primary Care / Sports Medicine Office Visit  Patient Information:  Patient ID: Kinsey Karch, female DOB: [birth year] Age: 51 y.o. MRN: [id number]   Jamie Reeves is a pleasant 51 y.o. female presenting with the following:  Chief Complaint  Patient presents with   Weight Check    Vitals:   03/14/22 1505  BP: 122/80  Pulse: 98  SpO2: 99%   Vitals:   03/14/22 1505  Weight: 272 lb (123.4 kg)  Height: 5\' 9"  (1.753 m)   Body mass index is 40.17 kg/m.     Independent interpretation of notes and tests performed by another provider:   None  Procedures performed:   None  Pertinent History, Exam, Impression, and Recommendations:   Problem List Items Addressed This Visit       Other   Anxiety    Chronic issue, worsened as of late with life / familial stressors. Discussed pharmacotherapy and adjunct treatments. Patient amenable to referral for psychiatric evaluation for consideration of therapy.      Relevant Orders   Ambulatory referral to Psychiatry   Encounter for weight management - Primary    Tolerating low dose phentermine and topiramate without reported adverse effects, weight loss noted. Plan to titrate both medications and return visit in 1 month planned.      Relevant Medications   phentermine 30 MG capsule   topiramate (TOPAMAX) 50 MG tablet     Orders & Medications Meds ordered this encounter  Medications   phentermine 30 MG capsule    Sig: Take 1 capsule (30 mg total) by mouth every morning.    Dispense:  30 capsule    Refill:  0   topiramate (TOPAMAX) 50 MG tablet    Sig: Take 1 tablet (50 mg total) by mouth daily.    Dispense:  60 tablet    Refill:  0   Orders Placed This Encounter  Procedures   Ambulatory referral to Psychiatry     No follow-ups on file.     Montel Culver, MD   Primary Care Sports Medicine [place]

## 2022-03-14 NOTE — Assessment & Plan Note (Signed)
Chronic issue, worsened as of late with life / familial stressors. Discussed pharmacotherapy and adjunct treatments. Patient amenable to referral for psychiatric evaluation for consideration of therapy.

## 2022-03-14 NOTE — Patient Instructions (Addendum)
-   Take new doses of phentermine and topiramate daily - Referral coordinator will contact regarding psychiatry scheduling - Return in 1 month
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
## 2022-03-14 NOTE — Assessment & Plan Note (Signed)
>>  ASSESSMENT AND PLAN FOR ANXIETY WRITTEN ON 03/14/2022  5:13 PM BY Gean Laursen, Earley Abide, MD  Chronic issue, worsened as of late with life / familial stressors. Discussed pharmacotherapy and adjunct treatments. Patient amenable to referral for psychiatric evaluation for consideration of therapy.

## 2022-03-14 NOTE — Telephone Encounter (Signed)
Dosage was changed to 50 mg 03/14/2022. Requested Prescriptions  Pending Prescriptions Disp Refills  . topiramate (TOPAMAX) 25 MG tablet [Pharmacy Med Name: TOPIRAMATE 25MG  TABLETS] 30 tablet 0    Sig: TAKE 1/2 TABLET BY MOUTH NIGHTLY FOR 1 WEEK THEN 1 TABLET NIGHTLY FOR 1 WEEK THEN 1 TABLET TWICE DAILY     Neurology: Anticonvulsants - topiramate & zonisamide Failed - 03/12/2022 11:25 AM      Failed - CO2 in normal range and within 360 days    CO2  Date Value Ref Range Status  03/03/2021 21 20 - 29 mmol/L Final         Failed - ALT in normal range and within 360 days    ALT  Date Value Ref Range Status  03/03/2021 19 0 - 32 IU/L Final         Passed - Cr in normal range and within 360 days    Creatinine, Ser  Date Value Ref Range Status  11/17/2021 0.84 0.57 - 1.00 mg/dL Final         Passed - AST in normal range and within 360 days    AST  Date Value Ref Range Status  11/17/2021 27 0 - 40 IU/L Final         Passed - Completed PHQ-2 or PHQ-9 in the last 360 days      Passed - Valid encounter within last 12 months    Recent Outpatient Visits          Today Encounter for weight management   [facility] at [place], Earley Abide, MD   1 month ago Encounter for weight management   [place] [facility] at [place], Earley Abide, MD   2 months ago Bilateral lower extremity edema    [facility] at [place], Earley Abide, MD   2 months ago [place] [facility] at [facility], Earley Abide, MD   5 months ago Spondylosis of 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 or radiculopathy   [facility] at [facility], Earley Abide, MD      Future Appointments            In 1 month Zigmund Daniel, Earley Abide, MD [facility] Medicine at [facility], [facility]   In 1  month Zigmund Daniel, Earley Abide, MD [facility] Primary Care and Sports Medicine at [facility], [facility]

## 2022-03-14 NOTE — Assessment & Plan Note (Signed)
Tolerating low dose phentermine and topiramate without reported adverse effects, weight loss noted. Plan to titrate both medications and return visit in 1 month planned.

## 2022-03-15 ENCOUNTER — Encounter: Payer: Self-pay | Admitting: Pain Medicine

## 2022-03-15 ENCOUNTER — Ambulatory Visit: Payer: BC Managed Care – PPO | Attending: Pain Medicine | Admitting: Pain Medicine

## 2022-03-15 VITALS — BP 122/83 | HR 96 | Temp 97.3°F | Resp 16 | Ht 69.0 in | Wt 269.0 lb

## 2022-03-15 DIAGNOSIS — M545 Low back pain, unspecified: Secondary | ICD-10-CM | POA: Insufficient documentation

## 2022-03-15 DIAGNOSIS — Z6841 Body Mass Index (BMI) 40.0 and over, adult: Secondary | ICD-10-CM | POA: Insufficient documentation

## 2022-03-15 DIAGNOSIS — M533 Sacrococcygeal disorders, not elsewhere classified: Secondary | ICD-10-CM | POA: Diagnosis not present

## 2022-03-15 DIAGNOSIS — M47816 Spondylosis without myelopathy or radiculopathy, lumbar region: Secondary | ICD-10-CM | POA: Diagnosis not present

## 2022-03-15 DIAGNOSIS — G894 Chronic pain syndrome: Secondary | ICD-10-CM | POA: Insufficient documentation

## 2022-03-15 DIAGNOSIS — G8929 Other chronic pain: Secondary | ICD-10-CM | POA: Diagnosis not present

## 2022-03-15 LAB — VITAMIN B12: Vitamin B-12: 594 pg/mL (ref 232–1245)

## 2022-03-15 NOTE — Patient Instructions (Signed)
______________________________________________________________________  Preparing for Procedure with Sedation  NOTICE: Due to recent regulatory changes, starting on December 21, 2020, procedures requiring intravenous (IV) sedation will no longer be performed at the [facility].  These types of procedures are required to be performed at [facility] ambulatory surgery facility.  We are very sorry for the inconvenience.  Procedure appointments are limited to planned procedures: No Prescription Refills. No disability issues will be discussed. No medication changes will be discussed.  Instructions: Oral Intake: Do not eat or drink anything for at least 8 hours prior to your procedure. (Exception: Blood Pressure Medication. See below.) Transportation: A driver is required. You may not drive yourself after the procedure. Blood Pressure Medicine: Do not forget to take your blood pressure medicine with a sip of water the morning of the procedure. If your Diastolic (lower reading) is above 100 mmHg, elective cases will be cancelled/rescheduled. Blood thinners: These will need to be stopped for procedures. Notify our staff if you are taking any blood thinners. Depending on which one you take, there will be specific instructions on how and when to stop it. Diabetics on insulin: Notify the staff so that you can be scheduled 1st case in the morning. If your diabetes requires high dose insulin, take only  of your normal insulin dose the morning of the procedure and notify the staff that you have done so. Preventing infections: Shower with an antibacterial soap the morning of your procedure. Build-up your immune system: Take 1000 mg of Vitamin C with every meal (3 times a day) the day prior to your procedure. Antibiotics: Inform the staff if you have a condition or reason that requires you to take antibiotics before dental procedures. Pregnancy: If you are pregnant, call and cancel the procedure. Sickness: If  you have a cold, fever, or any active infections, call and cancel the procedure. Arrival: You must be in the facility at least 30 minutes prior to your scheduled procedure. Children: Do not bring children with you. Dress appropriately: There is always the possibility that your clothing may get soiled. Valuables: Do not bring any jewelry or valuables.  Reasons to call and reschedule or cancel your procedure: (Following these recommendations will minimize the risk of a serious complication.) Surgeries: Avoid having procedures within 2 weeks of any surgery. (Avoid for 2 weeks before or after any surgery). Flu Shots: Avoid having procedures within 2 weeks of a flu shots. (Avoid for 2 weeks before or after immunizations). Barium: Avoid having a procedure within 7-10 days after having had a radiological study involving the use of radiological contrast. (Myelograms, Barium swallow or enema study). Heart attacks: Avoid any elective procedures or surgeries for the initial 6 months after a "Myocardial Infarction" (Heart Attack). Blood thinners: It is imperative that you stop these medications before procedures. Let us know if you if you take any blood thinner.  Infection: Avoid procedures during or within two weeks of an infection (including chest colds or gastrointestinal problems). Symptoms associated with infections include: Localized redness, fever, chills, night sweats or profuse sweating, burning sensation when voiding, cough, congestion, stuffiness, runny nose, sore throat, diarrhea, nausea, vomiting, cold or Flu symptoms, recent or current infections. It is specially important if the infection is over the area that we intend to treat. Heart and lung problems: Symptoms that may suggest an active cardiopulmonary problem include: cough, chest pain, breathing difficulties or shortness of breath, dizziness, ankle swelling, uncontrolled high or unusually low blood pressure, and/or palpitations. If you are  experiencing any of these symptoms, cancel your procedure and contact your primary care physician for an evaluation.  Remember:  Regular Business hours are:  Monday to Thursday 8:00 AM to 4:00 PM  Provider's Schedule: Milinda Pointer, MD:  Procedure days: Tuesday and Thursday 7:30 AM to 4:00 PM  Gillis Santa, MD:  Procedure days: Monday and Wednesday 7:30 AM to 4:00 PM ______________________________________________________________________  ____________________________________________________________________________________________  General Risks and Possible Complications  Patient Responsibilities: It is important that you read this as it is part of your informed consent. It is our duty to inform you of the risks and possible complications associated with treatments offered to you. It is your responsibility as a patient to read this and to ask questions about anything that is not clear or that you believe was not covered in this document.  Patient's Rights: You have the right to refuse treatment. You also have the right to change your mind, even after initially having agreed to have the treatment done. However, under this last option, if you wait until the last second to change your mind, you may be charged for the materials used up to that point.  Introduction: Medicine is not an Chief Strategy Officer. Everything in Medicine, including the lack of treatment(s), carries the potential for danger, harm, or loss (which is by definition: Risk). In Medicine, a complication is a secondary problem, condition, or disease that can aggravate an already existing one. All treatments carry the risk of possible complications. The fact that a side effects or complications occurs, does not imply that the treatment was conducted incorrectly. It must be clearly understood that these can happen even when everything is done following the highest safety standards.  No treatment: You can choose not to proceed with the  proposed treatment alternative. The "PRO(s)" would include: avoiding the risk of complications associated with the therapy. The "CON(s)" would include: not getting any of the treatment benefits. These benefits fall under one of three categories: diagnostic; therapeutic; and/or palliative. Diagnostic benefits include: getting information which can ultimately lead to improvement of the disease or symptom(s). Therapeutic benefits are those associated with the successful treatment of the disease. Finally, palliative benefits are those related to the decrease of the primary symptoms, without necessarily curing the condition (example: decreasing the pain from a flare-up of a chronic condition, such as incurable terminal cancer).  General Risks and Complications: These are associated to most interventional treatments. They can occur alone, or in combination. They fall under one of the following six (6) categories: no benefit or worsening of symptoms; bleeding; infection; nerve damage; allergic reactions; and/or death. No benefits or worsening of symptoms: In Medicine there are no guarantees, only probabilities. No healthcare provider can ever guarantee that a medical treatment will work, they can only state the probability that it may. Furthermore, there is always the possibility that the condition may worsen, either directly, or indirectly, as a consequence of the treatment. Bleeding: This is more common if the patient is taking a blood thinner, either prescription or over the counter (example: Goody Powders, Fish oil, Aspirin, Garlic, etc.), or if suffering a condition associated with impaired coagulation (example: Hemophilia, cirrhosis of the liver, low platelet counts, etc.). However, even if you do not have one on these, it can still happen. If you have any of these conditions, or take one of these drugs, make sure to notify your treating physician. Infection: This is more common in patients with a compromised  immune system, either due to disease (example:  diabetes, cancer, human immunodeficiency virus [HIV], etc.), or due to medications or treatments (example: therapies used to treat cancer and rheumatological diseases). However, even if you do not have one on these, it can still happen. If you have any of these conditions, or take one of these drugs, make sure to notify your treating physician. Nerve Damage: This is more common when the treatment is an invasive one, but it can also happen with the use of medications, such as those used in the treatment of cancer. The damage can occur to small secondary nerves, or to large primary ones, such as those in the spinal cord and brain. This damage may be temporary or permanent and it may lead to impairments that can range from temporary numbness to permanent paralysis and/or brain death. Allergic Reactions: Any time a substance or material comes in contact with our body, there is the possibility of an allergic reaction. These can range from a mild skin rash (contact dermatitis) to a severe systemic reaction (anaphylactic reaction), which can result in death. Death: In general, any medical intervention can result in death, most of the time due to an unforeseen complication. ____________________________________________________________________________________________  ______________________________________________________________________________________________  Body mass index (BMI)  Body mass index (BMI) is a common tool for deciding whether a person has an appropriate body weight.  It measures a persons weight in relation to their height.   According to the [facility] of health (NIH): A BMI of less than 18.5 means that a person is underweight. A BMI of between 18.5 and 24.9 is ideal. A BMI of between 25 and 29.9 is overweight. A BMI over 30 indicates obesity.  Weight Management Required  URGENT: Your weight has been found to be adversely affecting  your health.  Dear Ms. Krouse:  Your current Estimated body mass index is 39.72 kg/m as calculated from the following:   Height as of this encounter: $RemoveBeforeD'5\' 9"'[id number]$  (1.753 m).   Weight as of this encounter: 269 lb (122 kg).  Please use the table below to identify your weight category and associated incidence of chronic pain, secondary to your weight.  Body Mass Index (BMI) Classification BMI level (kg/m2) Category Associated incidence of chronic pain  <18  Underweight   18.5-24.9 Ideal body weight   25-29.9 Overweight  20%  30-34.9 Obese (Class I)  68%  35-39.9 Severe obesity (Class II)  136%  >40 Extreme obesity (Class III)  254%   In addition: You will be considered "Morbidly Obese", if your BMI is above 30 and you have one or more of the following conditions which are known to be caused and/or directly associated with obesity: 1.    Type 2 Diabetes (Which in turn can lead to cardiovascular diseases (CVD), stroke, peripheral vascular diseases (PVD), retinopathy, nephropathy, and neuropathy) 2.    Cardiovascular Disease (High Blood Pressure; Congestive Heart Failure; High Cholesterol; Coronary Artery Disease; Angina; or History of Heart Attacks) 3.    Breathing problems (Asthma; obesity-hypoventilation syndrome; obstructive sleep apnea; chronic inflammatory airway disease; reactive airway disease; or shortness of breath) 4.    Chronic kidney disease 5.    Liver disease (nonalcoholic fatty liver disease) 6.    High blood pressure 7.    Acid reflux (gastroesophageal reflux disease; heartburn) 8.    Osteoarthritis (OA) (with any of the following: hip pain; knee pain; and/or low back pain) 9.    Low back pain (Lumbar Facet Syndrome; and/or Degenerative Disc Disease) 10.  Hip pain (Osteoarthritis of hip) (For every  1 lbs of added body weight, there is a 2 lbs increase in pressure inside of each hip articulation. 1:2 mechanical relationship) 11.  Knee pain (Osteoarthritis of knee) (For every 1 lbs  of added body weight, there is a 4 lbs increase in pressure inside of each knee articulation. 1:4 mechanical relationship) (patients with a BMI>30 kg/m2 were 6.8 times more likely to develop knee OA than normal-weight individuals) 12.  Cancer: Epidemiological studies have shown that obesity is a risk factor for: post-menopausal breast cancer; cancers of the endometrium, colon and kidney cancer; malignant adenomas of the oesophagus. Obese subjects have an approximately 1.5-3.5-fold increased risk of developing these cancers compared with normal-weight subjects, and it has been estimated that between 15 and 45% of these cancers can be attributed to overweight. More recent studies suggest that obesity may also increase the risk of other types of cancer, including pancreatic, hepatic and gallbladder cancer. (Ref: Obesity and cancer. Pischon T, Nthlings U, [employer] H. Proc Nutr Soc. 2008 May;67(2):128-45. doi: 62.9528/U1324401027253664.) The International Agency for Research on Cancer (IARC) has identified 13 cancers associated with overweight and obesity: meningioma, multiple myeloma, adenocarcinoma of the esophagus, and cancers of the thyroid, postmenopausal breast cancer, gallbladder, stomach, liver, pancreas, kidney, ovaries, uterus, colon and rectal (colorectal) cancers. 39 percent of all cancers diagnosed in women and 24 percent of those diagnosed in men are associated with overweight and obesity.  Recommendation: At this point it is urgent that you take a step back and concentrate in loosing weight. Dedicate 100% of your efforts on this task. Nothing else will improve your health more than bringing your weight down and your BMI to less than 30. If you are here, you probably have chronic pain. We know that most chronic pain patients have difficulty exercising secondary to their pain. For this reason, you must rely on proper nutrition and diet in order to lose the weight. If your BMI is above 40, you should  seriously consider bariatric surgery. A realistic goal is to lose 10% of your body weight over a period of 12 months.  Be honest to yourself, if over time you have unsuccessfully tried to lose weight, then it is time for you to seek professional help and to enter a medically supervised weight management program, and/or undergo bariatric surgery. Stop procrastinating.   Pain management considerations:  1.    Pharmacological Problems: Be advised that the use of opioid analgesics (oxycodone; hydrocodone; morphine; methadone; codeine; and all of their derivatives) have been associated with decreased metabolism and weight gain.  For this reason, should we see that you are unable to lose weight while taking these medications, it may become necessary for [place] to taper down and indefinitely discontinue them.  2.    Technical Problems: The incidence of successful interventional therapies decreases as the patient's BMI increases. It is much more difficult to accomplish a safe and effective interventional therapy on a patient with a BMI above 35. 3.    Radiation Exposure Problems: The x-rays machine, used to accomplish injection therapies, will automatically increase their x-ray output in order to capture an appropriate bone image. This means that radiation exposure increases exponentially with the patient's BMI. (The higher the BMI, the higher the radiation exposure.) Although the level of radiation used at a given time is still safe to the patient, it is not for the physician and/or assisting staff. Unfortunately, radiation exposure is accumulative. Because physicians and the staff have to do procedures and be exposed on a daily  basis, this can result in health problems such as cancer and radiation burns. Radiation exposure to the staff is monitored by the radiation batches that they wear. The exposure levels are reported back to the staff on a quarterly basis. Depending on levels of exposure, physicians and staff may be  obligated by law to decrease this exposure. This means that they have the right and obligation to refuse providing therapies where they may be overexposed to radiation. For this reason, physicians may decline to offer therapies such as radiofrequency ablation or implants to patients with a BMI above 40. 4.    Current Trends: Be advised that the current trend is to no longer offer certain therapies to patients with a BMI equal to, or above 35, due to increase perioperative risks, increased technical procedural difficulties, and excessive radiation exposure to healthcare personnel.  ______________________________________________________________________________________________   ____________________________________________________________________________________________  Patient Information update  To: All of our patients.  Re: Name change.  It has been made official that our current name, "[facility]"   will soon be changed "[facility]".   The purpose of this change is to eliminate any confusion created by the concept of our practice being a "Pain Clinic". In the past this has led to the misconception that we treat pain primarily by the use of prescription medications.  Nothing can be farther from the truth.   Understanding PAIN MANAGEMENT: To further understand what our practice does, you first have to understand that "Pain Management" is a subspecialty that requires additional training once a physician has completed their specialty training, which can be in either Anesthesia, Neurology, Psychiatry, or Physical Medicine and Rehabilitation (PMR). Each one of these contributes to the final approach taken by each physician to the management of their patient's pain. To be a "Pain Management Specialist" you must have completed one of the specialty trainings below.  Anesthesiologists are  trained in clinical pharmacology and interventional techniques such as nerve blockade and regional as well as central neuroanatomy. They are trained to block pain before, during, and after surgical interventions.  Neurologists are trained in the diagnosis and pharmacological treatment of complex neurological conditions, such as Multiple Sclerosis, Parkinson's, spinal cord injuries, and other systemic conditions that may be associated with symptoms that may include but are not limited to pain. They tend to rely primarily on the treatment of chronic pain using prescription medications.  Psychiatrist are trained in conditions affecting the psychosocial wellbeing of patients including but not limited to depression, anxiety, schizophrenia, personality disorders, addiction, and other substance use disorders that may be associated with chronic pain. They tend to rely primarily on the treatment of chronic pain using prescription medications.   Physical Medicine and Rehabilitation (PMR) physicians, also known as physiatrists, treat a wide variety of medical conditions affecting the brain, spinal cord, nerves, bones, joints, ligaments, muscles, and tendons. Their training is primarily aimed at treating patients that have suffered injuries that have caused severe physical impairment. They are trained in the physical therapy and rehabilitation of those patients. They may also work alongside orthopedic surgeons or neurosurgeons using their expertise in [place] patients to recover after their surgery.  INTERVENTIONAL PAIN MANAGEMENT is s sub-subspecialty of Pain Management.  Our physicians are Board-certified in Anesthesia, Pain Management, and Interventional Pain Management.  This meaning that not only have they been trained and Board-certified in their specialty of Anesthesia, subspecialty of Pain Management, but they have also received  further training in the sub-subspecialty of Interventional Pain Management, in  order to be Board-certified as INTERVENTIONAL PAIN MANAGEMENT SPECIALIST.    Mission: Our goal is to use INTERVENTIONAL PAIN MANAGEMENT techniques as an alternative option to the chronic use of prescription medication for the treatment of pain. To make this clear, we have changed our name and we will no longer be taking patients for medication management. We will continue to offer medication management assessment and recommendations, but we will not be taking over any patient's medication regimen.  ____________________________________________________________________________________________
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
## 2022-03-15 NOTE — Progress Notes (Signed)
Safety precautions to be maintained throughout the outpatient stay will include: orient to surroundings, keep bed in low position, maintain call bell within reach at all times, provide assistance with transfer out of bed and ambulation.  

## 2022-03-16 ENCOUNTER — Encounter: Payer: Self-pay | Admitting: Family Medicine

## 2022-03-16 NOTE — Telephone Encounter (Signed)
Please review.  KP

## 2022-03-21 ENCOUNTER — Encounter (INDEPENDENT_AMBULATORY_CARE_PROVIDER_SITE_OTHER): Payer: Self-pay

## 2022-03-21 NOTE — Telephone Encounter (Signed)
fyi

## 2022-03-30 ENCOUNTER — Other Ambulatory Visit: Payer: Self-pay | Admitting: Family Medicine

## 2022-03-30 DIAGNOSIS — M47817 Spondylosis without myelopathy or radiculopathy, lumbosacral region: Secondary | ICD-10-CM

## 2022-03-30 NOTE — Telephone Encounter (Signed)
Requested Prescriptions  Pending Prescriptions Disp Refills   DULoxetine (CYMBALTA) 60 MG capsule [Pharmacy Med Name: DULOXETINE DR 60MG CAPSULES] 30 capsule 5    Sig: TAKE 1 CAPSULE(60 MG) BY MOUTH DAILY     Psychiatry: Antidepressants - SNRI - duloxetine Passed - 03/30/2022  5:18 PM      Passed - Cr in normal range and within 360 days    Creatinine, Ser  Date Value Ref Range Status  11/17/2021 0.84 0.57 - 1.00 mg/dL Final         Passed - eGFR is 30 or above and within 360 days    eGFR  Date Value Ref Range Status  11/17/2021 85 >59 mL/min/1.73 Final         Passed - Completed PHQ-2 or PHQ-9 in the last 360 days      Passed - Last BP in normal range    BP Readings from Last 1 Encounters:  03/15/22 122/83         Passed - Valid encounter within last 6 months    Recent Outpatient Visits           2 weeks ago Encounter for weight management   [facility] [place], Earley Abide, MD   1 month ago Encounter for weight management   [facility] [place], Earley Abide, MD   2 months ago Bilateral lower extremity edema   [facility] [place], Earley Abide, MD   3 months ago [place] [facility] at [facility], Earley Abide, MD   5 months ago Spondylosis of 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 or radiculopathy   [facility] at [facility], Earley Abide, MD       Future Appointments             In 2 weeks Zigmund Daniel, Earley Abide, MD [facility] at [facility], [facility]   In 2 weeks Zigmund Daniel, Earley Abide, MD [place] and [facility] at [facility], [facility]

## 2022-04-18 ENCOUNTER — Ambulatory Visit (INDEPENDENT_AMBULATORY_CARE_PROVIDER_SITE_OTHER): Payer: BC Managed Care – PPO | Admitting: Family Medicine

## 2022-04-18 VITALS — BP 120/80 | HR 92 | Ht 69.0 in | Wt 274.0 lb

## 2022-04-18 DIAGNOSIS — F418 Other specified anxiety disorders: Secondary | ICD-10-CM | POA: Diagnosis not present

## 2022-04-18 DIAGNOSIS — E538 Deficiency of other specified B group vitamins: Secondary | ICD-10-CM

## 2022-04-18 DIAGNOSIS — Z13220 Encounter for screening for lipoid disorders: Secondary | ICD-10-CM | POA: Diagnosis not present

## 2022-04-18 DIAGNOSIS — E213 Hyperparathyroidism, unspecified: Secondary | ICD-10-CM | POA: Diagnosis not present

## 2022-04-18 DIAGNOSIS — K219 Gastro-esophageal reflux disease without esophagitis: Secondary | ICD-10-CM

## 2022-04-18 DIAGNOSIS — Z7689 Persons encountering health services in other specified circumstances: Secondary | ICD-10-CM

## 2022-04-18 MED ORDER — WEGOVY 0.25 MG/0.5ML ~~LOC~~ SOAJ: 0.2500 mg | SUBCUTANEOUS | 0 refills | Status: DC

## 2022-04-18 MED ORDER — AUVELITY 45-105 MG PO TBCR: 1.0000 | EXTENDED_RELEASE_TABLET | Freq: Two times a day (BID) | ORAL | 2 refills | Status: DC

## 2022-04-18 NOTE — Patient Instructions (Signed)
-   Contact us regarding Wegovy prescription - Obtain labs - Contact GI to schedule follow-up for reflux - We will contact you to schedule follow-up

## 2022-04-19 ENCOUNTER — Encounter: Payer: Self-pay | Admitting: Family Medicine

## 2022-04-19 ENCOUNTER — Ambulatory Visit: Payer: BC Managed Care – PPO | Admitting: Family Medicine

## 2022-04-19 ENCOUNTER — Other Ambulatory Visit: Payer: Self-pay | Admitting: Family Medicine

## 2022-04-19 LAB — COMPREHENSIVE METABOLIC PANEL
ALT: 37 IU/L — ABNORMAL HIGH (ref 0–32)
AST: 35 IU/L (ref 0–40)
Albumin/Globulin Ratio: 1.4 (ref 1.2–2.2)
Albumin: 4.2 g/dL (ref 3.9–4.9)
Alkaline Phosphatase: 94 IU/L (ref 44–121)
BUN/Creatinine Ratio: 13 (ref 9–23)
BUN: 11 mg/dL (ref 6–24)
Bilirubin Total: 0.3 mg/dL (ref 0.0–1.2)
CO2: 22 mmol/L (ref 20–29)
Calcium: 9.5 mg/dL (ref 8.7–10.2)
Chloride: 105 mmol/L (ref 96–106)
Creatinine, Ser: 0.83 mg/dL (ref 0.57–1.00)
Globulin, Total: 3 g/dL (ref 1.5–4.5)
Glucose: 90 mg/dL (ref 70–99)
Potassium: 4.7 mmol/L (ref 3.5–5.2)
Sodium: 140 mmol/L (ref 134–144)
Total Protein: 7.2 g/dL (ref 6.0–8.5)
eGFR: 86 mL/min/{1.73_m2} (ref >59)

## 2022-04-19 LAB — CBC
Hematocrit: 41 % (ref 34.0–46.6)
Hemoglobin: 13.3 g/dL (ref 11.1–15.9)
MCH: 27.4 pg (ref 26.6–33.0)
MCHC: 32.4 g/dL (ref 31.5–35.7)
MCV: 85 fL (ref 79–97)
Platelets: 348 10*3/uL (ref 150–450)
RBC: 4.85 x10E6/uL (ref 3.77–5.28)
RDW: 14 % (ref 11.7–15.4)
WBC: 8.5 10*3/uL (ref 3.4–10.8)

## 2022-04-19 LAB — LIPID PANEL
Chol/HDL Ratio: 3.9 ratio (ref 0.0–4.4)
Cholesterol, Total: 217 mg/dL — ABNORMAL HIGH (ref 100–199)
HDL: 55 mg/dL (ref >39)
LDL Chol Calc (NIH): 137 mg/dL — ABNORMAL HIGH (ref 0–99)
Triglycerides: 141 mg/dL (ref 0–149)
VLDL Cholesterol Cal: 25 mg/dL (ref 5–40)

## 2022-04-19 LAB — TSH: TSH: 2.14 u[IU]/mL (ref 0.450–4.500)

## 2022-04-19 MED ORDER — TIRZEPATIDE 2.5 MG/0.5ML ~~LOC~~ SOAJ: 2.5000 mg | SUBCUTANEOUS | 1 refills | Status: DC

## 2022-04-19 NOTE — Telephone Encounter (Signed)
fyi

## 2022-04-27 DIAGNOSIS — F418 Other specified anxiety disorders: Secondary | ICD-10-CM | POA: Insufficient documentation

## 2022-04-27 NOTE — Assessment & Plan Note (Signed)
Annual examination completed, risk stratification labs ordered, anticipatory guidance provided.  We will follow labs once resulted. 

## 2022-04-27 NOTE — Assessment & Plan Note (Signed)
IPJASN discussed and Rx sent to pharmacy on file. Labs ordered for risk stratification.

## 2022-04-27 NOTE — Assessment & Plan Note (Signed)
Discussed treatment strategies, initiate [place] given comorbid treatment.

## 2022-04-27 NOTE — Progress Notes (Signed)
     Primary Care / Sports Medicine Office Visit  Patient Information:  Patient ID: Jamie Reeves, female DOB: [birth year] Age: 51 y.o. MRN: [id number]   Jamie Reeves is a pleasant 51 y.o. female presenting with the following:  Chief Complaint  Patient presents with   Weight Check    Weight management    Vitals:   04/18/22 1533  BP: 120/80  Pulse: 92  SpO2: 98%   Vitals:   04/18/22 1533  Weight: 274 lb (124.3 kg)  Height: 5\' 9"  (1.753 m)   Body mass index is 40.46 kg/m.  No results found.   Independent interpretation of notes and tests performed by another provider:   None  Procedures performed:   None  Pertinent History, Exam, Impression, and Recommendations:   Problem List Items Addressed This Visit       Digestive   Gastroesophageal reflux disease without esophagitis    Chronic, uncontrolled, referral to GI placed.        Other   Encounter for weight management - Primary    Wegovy discussed and Rx sent to pharmacy on file. Labs ordered for risk stratification.       Relevant Orders   TSH (Completed)   Comprehensive metabolic panel (Completed)   Depression with anxiety    Discussed treatment strategies, initiate Auvelity given comorbid treatment.      Relevant Medications   Dextromethorphan-buPROPion ER (AUVELITY) 45-105 MG TBCR   Other Visit Diagnoses     Screening for lipoid disorders       Relevant Orders   Lipid panel (Completed)   Comprehensive metabolic panel (Completed)   B12 deficiency       Relevant Orders   CBC (Completed)        Orders & Medications Meds ordered this encounter  Medications   DISCONTD: WEGOVY 0.25 MG/0.5ML SOAJ    Sig: Inject 0.25 mg into the skin once a week. Use this dose for 1 month (4 shots) and then increase to next higher dose.    Dispense:  2 mL    Refill:  0   Dextromethorphan-buPROPion ER (AUVELITY) 45-105 MG TBCR    Sig: Take 1 tablet by mouth in the morning and at bedtime.    Dispense:   60 tablet    Refill:  2   Orders Placed This Encounter  Procedures   CBC   Lipid panel   TSH   Comprehensive metabolic panel     No follow-ups on file.     , MD, [facility]   Primary Care Sports Medicine Primary Care and Sports Medicine at [facility]

## 2022-04-27 NOTE — Assessment & Plan Note (Signed)
Chronic, uncontrolled, referral to GI placed.

## 2022-04-28 ENCOUNTER — Encounter: Payer: Self-pay | Admitting: Gastroenterology

## 2022-04-28 ENCOUNTER — Ambulatory Visit (INDEPENDENT_AMBULATORY_CARE_PROVIDER_SITE_OTHER): Payer: BC Managed Care – PPO | Admitting: Gastroenterology

## 2022-04-28 VITALS — BP 143/90 | HR 105 | Temp 98.4°F | Ht 69.0 in | Wt 277.0 lb

## 2022-04-28 DIAGNOSIS — K219 Gastro-esophageal reflux disease without esophagitis: Secondary | ICD-10-CM | POA: Diagnosis not present

## 2022-04-28 NOTE — Addendum Note (Signed)
Addended by: Roena Malady on: 04/28/2022 01:22 PM   Modules accepted: Orders
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
## 2022-04-29 ENCOUNTER — Other Ambulatory Visit: Payer: Self-pay | Admitting: Family Medicine

## 2022-04-29 DIAGNOSIS — K219 Gastro-esophageal reflux disease without esophagitis: Secondary | ICD-10-CM

## 2022-04-29 NOTE — Telephone Encounter (Signed)
Requested Prescriptions  Pending Prescriptions Disp Refills   pantoprazole (PROTONIX) 40 MG tablet [Pharmacy Med Name: PANTOPRAZOLE 40MG  TABLETS] 90 tablet 3    Sig: TAKE 1 TABLET(40 MG) BY MOUTH DAILY     Gastroenterology: Proton Pump Inhibitors Passed - 04/29/2022 10:28 AM      Passed - Valid encounter within last 12 months    Recent Outpatient Visits           1 week ago Encounter for weight management   [place] [facility] at [facility], [facility], MD   1 month ago Encounter for weight management   [place] [facility] at [facility], Emelia Loron, MD   2 months ago Encounter for weight management   [place] [facility] at [facility], Emelia Loron, MD   3 months ago Bilateral lower extremity edema   [place] [facility] Medicine at [facility], [facility], MD   4 months ago COVID   [facility] Primary Care and Sports Medicine at [facility], [facility], MD

## 2022-05-11 ENCOUNTER — Encounter: Payer: Self-pay | Admitting: Gastroenterology

## 2022-05-12 ENCOUNTER — Ambulatory Visit
Admission: RE | Admit: 2022-05-12 | Discharge: 2022-05-12 | Disposition: A | Discharge status: Home / Self Care | Payer: BC Managed Care – PPO | Attending: Gastroenterology | Admitting: Gastroenterology

## 2022-05-12 ENCOUNTER — Ambulatory Visit: Payer: BC Managed Care – PPO | Admitting: Certified Registered"

## 2022-05-12 ENCOUNTER — Encounter: Payer: Self-pay | Admitting: Gastroenterology

## 2022-05-12 ENCOUNTER — Encounter
Admission: RE | Disposition: A | Discharge status: Home / Self Care | Payer: Self-pay | Source: Home / Self Care | Attending: Gastroenterology

## 2022-05-12 DIAGNOSIS — Z6841 Body Mass Index (BMI) 40.0 and over, adult: Secondary | ICD-10-CM | POA: Diagnosis not present

## 2022-05-12 DIAGNOSIS — F32A Depression, unspecified: Secondary | ICD-10-CM | POA: Insufficient documentation

## 2022-05-12 DIAGNOSIS — Z87891 Personal history of nicotine dependence: Secondary | ICD-10-CM | POA: Insufficient documentation

## 2022-05-12 DIAGNOSIS — F419 Anxiety disorder, unspecified: Secondary | ICD-10-CM | POA: Diagnosis not present

## 2022-05-12 DIAGNOSIS — K219 Gastro-esophageal reflux disease without esophagitis: Secondary | ICD-10-CM | POA: Diagnosis not present

## 2022-05-12 DIAGNOSIS — G4730 Sleep apnea, unspecified: Secondary | ICD-10-CM | POA: Diagnosis not present

## 2022-05-12 HISTORY — PX: ESOPHAGOGASTRODUODENOSCOPY (EGD) WITH PROPOFOL: SHX5813

## 2022-05-12 HISTORY — PX: BRAVO PH STUDY: SHX5421

## 2022-05-12 SURGERY — ESOPHAGOGASTRODUODENOSCOPY (EGD) WITH PROPOFOL
Anesthesia: General

## 2022-05-12 MED ORDER — PROPOFOL 10 MG/ML IV BOLUS
INTRAVENOUS | Status: DC | PRN
  Administered 2022-05-12: 80 mg via INTRAVENOUS

## 2022-05-12 MED ORDER — LIDOCAINE HCL (CARDIAC) PF 100 MG/5ML IV SOSY
PREFILLED_SYRINGE | INTRAVENOUS | Status: DC | PRN
  Administered 2022-05-12: 100 mg via INTRAVENOUS

## 2022-05-12 MED ORDER — KETAMINE HCL 10 MG/ML IJ SOLN
INTRAMUSCULAR | Status: DC | PRN
  Administered 2022-05-12: 20 mg via INTRAVENOUS

## 2022-05-12 MED ORDER — SODIUM CHLORIDE 0.9 % IV SOLN
INTRAVENOUS | Status: DC
  Administered 2022-05-12: 1000 mL via INTRAVENOUS

## 2022-05-12 MED ORDER — DEXMEDETOMIDINE HCL IN NACL 80 MCG/20ML IV SOLN
INTRAVENOUS | Status: DC | PRN
  Administered 2022-05-12: 12 ug via BUCCAL
  Administered 2022-05-12: 8 ug via BUCCAL

## 2022-05-12 MED ORDER — KETAMINE HCL 50 MG/5ML IJ SOSY
PREFILLED_SYRINGE | INTRAMUSCULAR | Status: AC
  Filled 2022-05-12: qty 5

## 2022-05-12 NOTE — OR Nursing (Signed)
Pt instructed to remain on PPI per MD.
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
## 2022-05-12 NOTE — OR Nursing (Addendum)
S/P BRAVO STUDY. ID NUMBER [id number]. LOT #[id number]. GEJ 39. MINUS 6. CAPSULE INSTILLED AT 33. TOLERATED WELL. PLACEMENT VERI BY VISUALIZATION
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
## 2022-05-12 NOTE — Op Note (Signed)
[facility] [name] Procedure Date: 05/12/2022 8:11 AM MRN: [id number] Account #: [id number] Date of Birth: 1970-11-11 Admit Type: Outpatient Age: 51 Room: [facility] ENDO ROOM 4 Gender: Female Note Status: Finalized Instrument Name: Patton Salles Endoscope [id number] Procedure:             Upper GI endoscopy Indications:           Heartburn Providers:             Midge Minium MD, MD Medicines:             Propofol per Anesthesia Complications:         No immediate complications. Procedure:             Pre-Anesthesia Assessment:                        - Prior to the procedure, a History and Physical was                         performed, and patient medications and allergies were                         reviewed. The patient's tolerance of previous                         anesthesia was also reviewed. The risks and benefits                         of the procedure and the sedation options and risks                         were discussed with the patient. All questions were                         answered, and informed consent was obtained. Prior                         Anticoagulants: The patient has taken no anticoagulant                         or antiplatelet agents. ASA Grade Assessment: II - A                         patient with mild systemic disease. After reviewing                         the risks and benefits, the patient was deemed in                         satisfactory condition to undergo the procedure.                        - Prior to the procedure, a History and Physical was                         performed, and patient medications and allergies were                         reviewed.  The patient's tolerance of previous                         anesthesia was also reviewed. The risks and benefits                         of the procedure and the sedation options and risks                         were discussed with the  patient. All questions were                         answered, and informed consent was obtained. Prior                         Anticoagulants: The patient has taken no anticoagulant                         or antiplatelet agents. ASA Grade Assessment: II - A                         patient with mild systemic disease. After reviewing                         the risks and benefits, the patient was deemed in                         satisfactory condition to undergo the procedure.                        After obtaining informed consent, the endoscope was                         passed under direct vision. Throughout the procedure,                         the patient's blood pressure, pulse, and oxygen                         saturations were monitored continuously. The Endoscope                         was introduced through the mouth, and advanced to the                         second part of duodenum. The upper GI endoscopy was                         accomplished without difficulty. The patient tolerated                         the procedure well. Findings:      The esophagus was normal. The BRAVO capsule with delivery system was       introduced through the mouth and advanced into the esophagus, such that       the BRAVO pH capsule was positioned 39 cm from the incisors, which was 6       cm proximal to  the GE junction. The BRAVO pH capsule was then deployed       and attached to the esophageal mucosa. The delivery system was then       withdrawn. Endoscopy was utilized for probe placement and diagnostic       evaluation.      The stomach was normal.      The examined duodenum was normal. Impression:            - Normal esophagus.                        - Normal stomach.                        - Normal examined duodenum.                        - The BRAVO pH capsule was deployed.                        - No specimens collected. Recommendation:        - Discharge patient to home.                         - Resume previous diet.                        - Continue present medications. Procedure Code(s):     --- Professional ---                        [phone], Esophagogastroduodenoscopy, flexible,                         transoral; diagnostic, including collection of                         specimen(s) by brushing or washing, when performed                         (separate procedure) Diagnosis Code(s):     --- Professional ---                        R12, Heartburn CPT copyright 2022 American Medical Association. All rights reserved. The codes documented in this report are preliminary and upon coder review may  be revised to meet current compliance requirements. Midge Minium MD, MD 05/12/2022 10:47:38 AM This report has been signed electronically. Number of Addenda: 0 Note Initiated On: 05/12/2022 8:11 AM Estimated Blood Loss:  Estimated blood loss: none.      [facility]

## 2022-05-12 NOTE — Anesthesia Procedure Notes (Signed)
Procedure Name: MAC Date/Time: 05/12/2022 10:36 AM  Performed by: Biagio Borg, CRNAPre-anesthesia Checklist: Patient identified, Emergency Drugs available, Suction available, Patient being monitored and Timeout performed Patient Re-evaluated:Patient Re-evaluated prior to induction Oxygen Delivery Method: Nasal cannula Induction Type: IV induction Placement Confirmation: positive ETCO2 and CO2 detector

## 2022-05-12 NOTE — Transfer of Care (Signed)
Immediate Anesthesia Transfer of Care Note  Patient: Jamie Reeves  Procedure(s) Performed: ESOPHAGOGASTRODUODENOSCOPY (EGD) WITH PROPOFOL BRAVO PH STUDY  Patient Location: PACU and Endoscopy Unit  Anesthesia Type:General  Level of Consciousness: awake  Airway & Oxygen Therapy: Patient Spontanous Breathing  Post-op Assessment: Report given to RN and Post -op Vital signs reviewed and stable  Post vital signs: Reviewed and stable  Last Vitals:  Vitals Value Taken Time  BP 119/71 05/12/22 1051  Temp    Pulse 98 05/12/22 1051  Resp 19 05/12/22 1051  SpO2 99 % 05/12/22 1051    Last Pain:  Vitals:   05/12/22 1051  TempSrc:   PainSc: 0-No pain         Complications: No notable events documented.

## 2022-05-12 NOTE — Anesthesia Postprocedure Evaluation (Signed)
Anesthesia Post Note  Patient: Karnisha Lefebre  Procedure(s) Performed: ESOPHAGOGASTRODUODENOSCOPY (EGD) WITH PROPOFOL BRAVO PH STUDY  Patient location during evaluation: PACU Anesthesia Type: General Level of consciousness: awake and awake and alert Pain management: satisfactory to patient Vital Signs Assessment: post-procedure vital signs reviewed and stable Respiratory status: nonlabored ventilation and respiratory function stable Cardiovascular status: stable Anesthetic complications: no  No notable events documented.   Last Vitals:  Vitals:   05/12/22 1101 05/12/22 1111  BP: 132/69 113/77  Pulse: 89 88  Resp: 18 17  Temp:    SpO2: 98% 99%    Last Pain:  Vitals:   05/12/22 1101  TempSrc:   PainSc: 0-No pain                 VAN STAVEREN,Leatta Alewine

## 2022-05-17 ENCOUNTER — Encounter: Payer: Self-pay | Admitting: Family Medicine

## 2022-05-17 NOTE — Telephone Encounter (Signed)
Please review.  KP

## 2022-05-17 NOTE — Telephone Encounter (Signed)
Pt response.  KP

## 2022-05-18 ENCOUNTER — Other Ambulatory Visit: Payer: Self-pay

## 2022-05-18 ENCOUNTER — Encounter: Payer: Self-pay | Admitting: Pain Medicine

## 2022-05-18 DIAGNOSIS — R32 Unspecified urinary incontinence: Secondary | ICD-10-CM

## 2022-05-18 NOTE — Telephone Encounter (Signed)
Referral placed.  KP 

## 2022-05-19 NOTE — Telephone Encounter (Signed)
I have asked Latonia to check with Dr. Daisy Blossom office for results.

## 2022-05-24 ENCOUNTER — Encounter: Payer: Self-pay | Admitting: Family Medicine

## 2022-05-24 NOTE — Telephone Encounter (Signed)
Please review.  KP

## 2022-05-26 DIAGNOSIS — E213 Hyperparathyroidism, unspecified: Secondary | ICD-10-CM | POA: Diagnosis not present

## 2022-05-27 DIAGNOSIS — E213 Hyperparathyroidism, unspecified: Secondary | ICD-10-CM | POA: Diagnosis not present

## 2022-05-27 DIAGNOSIS — G4733 Obstructive sleep apnea (adult) (pediatric): Secondary | ICD-10-CM | POA: Diagnosis not present

## 2022-05-27 DIAGNOSIS — E785 Hyperlipidemia, unspecified: Secondary | ICD-10-CM | POA: Diagnosis not present

## 2022-06-01 ENCOUNTER — Ambulatory Visit: Payer: BC Managed Care – PPO | Admitting: Licensed Clinical Social Worker

## 2022-06-01 DIAGNOSIS — F4323 Adjustment disorder with mixed anxiety and depressed mood: Secondary | ICD-10-CM
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
## 2022-06-02 ENCOUNTER — Encounter: Payer: Self-pay | Admitting: Gastroenterology

## 2022-06-02 ENCOUNTER — Encounter: Payer: Self-pay | Admitting: *Deleted

## 2022-06-06 ENCOUNTER — Encounter: Payer: Self-pay | Admitting: *Deleted

## 2022-06-08 ENCOUNTER — Ambulatory Visit: Payer: BC Managed Care – PPO | Attending: Pain Medicine | Admitting: Pain Medicine

## 2022-06-08 ENCOUNTER — Encounter: Payer: Self-pay | Admitting: Pain Medicine

## 2022-06-08 VITALS — BP 137/82 | HR 102 | Temp 97.1°F | Ht 69.0 in | Wt 279.0 lb

## 2022-06-08 DIAGNOSIS — M47816 Spondylosis without myelopathy or radiculopathy, lumbar region: Secondary | ICD-10-CM | POA: Insufficient documentation

## 2022-06-08 DIAGNOSIS — M545 Low back pain, unspecified: Secondary | ICD-10-CM | POA: Diagnosis not present

## 2022-06-08 DIAGNOSIS — Z6841 Body Mass Index (BMI) 40.0 and over, adult: Secondary | ICD-10-CM | POA: Diagnosis not present

## 2022-06-08 DIAGNOSIS — G8929 Other chronic pain: Secondary | ICD-10-CM | POA: Insufficient documentation

## 2022-06-08 DIAGNOSIS — G608 Other hereditary and idiopathic neuropathies: Secondary | ICD-10-CM | POA: Diagnosis not present

## 2022-06-08 DIAGNOSIS — M79605 Pain in left leg: Secondary | ICD-10-CM | POA: Diagnosis not present

## 2022-06-08 DIAGNOSIS — R94130 Abnormal response to nerve stimulation, unspecified: Secondary | ICD-10-CM | POA: Diagnosis not present

## 2022-06-08 DIAGNOSIS — M79604 Pain in right leg: Secondary | ICD-10-CM | POA: Diagnosis not present

## 2022-06-08 DIAGNOSIS — M5136 Other intervertebral disc degeneration, lumbar region: Secondary | ICD-10-CM | POA: Diagnosis not present

## 2022-06-08 DIAGNOSIS — M542 Cervicalgia: Secondary | ICD-10-CM | POA: Insufficient documentation

## 2022-06-08 NOTE — Progress Notes (Signed)
Safety precautions to be maintained throughout the outpatient stay will include: orient to surroundings, keep bed in low position, maintain call bell within reach at all times, provide assistance with transfer out of bed and ambulation.  
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
## 2022-06-08 NOTE — Patient Instructions (Signed)
_________________________________________________________________________________________  Body mass index (BMI)  Body mass index (BMI) is a common tool for deciding whether a person has an appropriate body weight.  It measures a persons weight in relation to their height.   According to the [facility] of health (NIH): A BMI of less than 18.5 means that a person is underweight. A BMI of between 18.5 and 24.9 is ideal. A BMI of between 25 and 29.9 is overweight. A BMI over 30 indicates obesity.  Weight Management Required  URGENT: Your weight has been found to be adversely affecting your health.  Dear Ms. Salls:  Your current Estimated body mass index is 41.28 kg/m as calculated from the following:   Height as of 05/12/22: 5\' 9"  (1.753 m).   Weight as of 05/12/22: 279 lb 9.1 oz (126.8 kg).  Please use the table below to identify your weight category and associated incidence of chronic pain, secondary to your weight.  Body Mass Index (BMI) Classification BMI level (kg/m2) Category Associated incidence of chronic pain  <18  Underweight   18.5-24.9 Ideal body weight   25-29.9 Overweight  20%  30-34.9 Obese (Class I)  68%  35-39.9 Severe obesity (Class II)  136%  >40 Extreme obesity (Class III)  254%   In addition: You will be considered "Morbidly Obese", if your BMI is above 30 and you have one or more of the following conditions which are known to be caused and/or directly associated with obesity: 1.    Type 2 Diabetes (Which in turn can lead to cardiovascular diseases (CVD), stroke, peripheral vascular diseases (PVD), retinopathy, nephropathy, and neuropathy) 2.    Cardiovascular Disease (High Blood Pressure; Congestive Heart Failure; High Cholesterol; Coronary Artery Disease; Angina; or History of Heart Attacks) 3.    Breathing problems (Asthma; obesity-hypoventilation syndrome; obstructive sleep apnea; chronic inflammatory airway disease; reactive airway disease; or  shortness of breath) 4.    Chronic kidney disease 5.    Liver disease (nonalcoholic fatty liver disease) 6.    High blood pressure 7.    Acid reflux (gastroesophageal reflux disease; heartburn) 8.    Osteoarthritis (OA) (with any of the following: hip pain; knee pain; and/or low back pain) 9.    Low back pain (Lumbar Facet Syndrome; and/or Degenerative Disc Disease) 10.  Hip pain (Osteoarthritis of hip) (For every 1 lbs of added body weight, there is a 2 lbs increase in pressure inside of each hip articulation. 1:2 mechanical relationship) 11.  Knee pain (Osteoarthritis of knee) (For every 1 lbs of added body weight, there is a 4 lbs increase in pressure inside of each knee articulation. 1:4 mechanical relationship) (patients with a BMI>30 kg/m2 were 6.8 times more likely to develop knee OA than normal-weight individuals) 12.  Cancer: Epidemiological studies have shown that obesity is a risk factor for: post-menopausal breast cancer; cancers of the endometrium, colon and kidney cancer; malignant adenomas of the oesophagus. Obese subjects have an approximately 1.5-3.5-fold increased risk of developing these cancers compared with normal-weight subjects, and it has been estimated that between 15 and 45% of these cancers can be attributed to overweight. More recent studies suggest that obesity may also increase the risk of other types of cancer, including pancreatic, hepatic and gallbladder cancer. (Ref: Obesity and cancer. Pischon T, Nthlings U, [employer] H. Proc Nutr Soc. 2008 May;67(2):128-45. doi: 66.5993/T7017793903009233.) The International Agency for Research on Cancer (IARC) has identified 13 cancers associated with overweight and obesity: meningioma, multiple myeloma, adenocarcinoma of the esophagus, and cancers of  the thyroid, postmenopausal breast cancer, gallbladder, stomach, liver, pancreas, kidney, ovaries, uterus, colon and rectal (colorectal) cancers. 36 percent of all cancers diagnosed in women  and 24 percent of those diagnosed in men are associated with overweight and obesity.  Recommendation: At this point it is urgent that you take a step back and concentrate in loosing weight. Dedicate 100% of your efforts on this task. Nothing else will improve your health more than bringing your weight down and your BMI to less than 30. If you are here, you probably have chronic pain. We know that most chronic pain patients have difficulty exercising secondary to their pain. For this reason, you must rely on proper nutrition and diet in order to lose the weight. If your BMI is above 40, you should seriously consider bariatric surgery. A realistic goal is to lose 10% of your body weight over a period of 12 months.  Be honest to yourself, if over time you have unsuccessfully tried to lose weight, then it is time for you to seek professional help and to enter a medically supervised weight management program, and/or undergo bariatric surgery. Stop procrastinating.   Pain management considerations:  1.    Pharmacological Problems: Be advised that the use of opioid analgesics (oxycodone; hydrocodone; morphine; methadone; codeine; and all of their derivatives) have been associated with decreased metabolism and weight gain.  For this reason, should we see that you are unable to lose weight while taking these medications, it may become necessary for [place] to taper down and indefinitely discontinue them.  2.    Technical Problems: The incidence of successful interventional therapies decreases as the patient's BMI increases. It is much more difficult to accomplish a safe and effective interventional therapy on a patient with a BMI above 35. 3.    Radiation Exposure Problems: The x-rays machine, used to accomplish injection therapies, will automatically increase their x-ray output in order to capture an appropriate bone image. This means that radiation exposure increases exponentially with the patient's BMI. (The higher the  BMI, the higher the radiation exposure.) Although the level of radiation used at a given time is still safe to the patient, it is not for the physician and/or assisting staff. Unfortunately, radiation exposure is accumulative. Because physicians and the staff have to do procedures and be exposed on a daily basis, this can result in health problems such as cancer and radiation burns. Radiation exposure to the staff is monitored by the radiation batches that they wear. The exposure levels are reported back to the staff on a quarterly basis. Depending on levels of exposure, physicians and staff may be obligated by law to decrease this exposure. This means that they have the right and obligation to refuse providing therapies where they may be overexposed to radiation. For this reason, physicians may decline to offer therapies such as radiofrequency ablation or implants to patients with a BMI above 40. 4.    Current Trends: Be advised that the current trend is to no longer offer certain therapies to patients with a BMI equal to, or above 35, due to increase perioperative risks, increased technical procedural difficulties, and excessive radiation exposure to healthcare personnel.  _________________________________________________________________________________________   ____________________________________________________________________________________________  Patient Information update  To: All of our patients.  Re: Name change.  It has been made official that our current name, "[facility]"   will soon be changed to "[facility]".   The purpose  of this change is to eliminate any confusion created by the concept of our practice being a "Medication Management Pain Clinic". In the past this has led to the misconception that we treat pain primarily by the use of prescription medications.  Nothing  can be farther from the truth.   Understanding PAIN MANAGEMENT: To further understand what our practice does, you first have to understand that "Pain Management" is a subspecialty that requires additional training once a physician has completed their specialty training, which can be in either Anesthesia, Neurology, Psychiatry, or Physical Medicine and Rehabilitation (PMR). Each one of these contributes to the final approach taken by each physician to the management of their patient's pain. To be a "Pain Management Specialist" you must have first completed one of the specialty trainings below.  Anesthesiologists - trained in clinical pharmacology and interventional techniques such as nerve blockade and regional as well as central neuroanatomy. They are trained to block pain before, during, and after surgical interventions.  Neurologists - trained in the diagnosis and pharmacological treatment of complex neurological conditions, such as Multiple Sclerosis, Parkinson's, spinal cord injuries, and other systemic conditions that may be associated with symptoms that may include but are not limited to pain. They tend to rely primarily on the treatment of chronic pain using prescription medications.  Psychiatrist - trained in conditions affecting the psychosocial wellbeing of patients including but not limited to depression, anxiety, schizophrenia, personality disorders, addiction, and other substance use disorders that may be associated with chronic pain. They tend to rely primarily on the treatment of chronic pain using prescription medications.   Physical Medicine and Rehabilitation (PMR) physicians, also known as physiatrists - trained to treat a wide variety of medical conditions affecting the brain, spinal cord, nerves, bones, joints, ligaments, muscles, and tendons. Their training is primarily aimed at treating patients that have suffered injuries that have caused severe physical impairment. Their training  is primarily aimed at the physical therapy and rehabilitation of those patients. They may also work alongside orthopedic surgeons or neurosurgeons using their expertise in assisting surgical patients to recover after their surgeries.  INTERVENTIONAL PAIN MANAGEMENT is sub-subspecialty of Pain Management.  Our physicians are Board-certified in Anesthesia, Pain Management, and Interventional Pain Management.  This meaning that not only have they been trained and Board-certified in their specialty of Anesthesia, and subspecialty of Pain Management, but they have also received further training in the sub-subspecialty of Interventional Pain Management, in order to become Board-certified as INTERVENTIONAL PAIN MANAGEMENT SPECIALIST.    Mission: Our goal is to use our skills in  INTERVENTIONAL PAIN MANAGEMENT as alternatives to the chronic use of prescription opioid medications for the treatment of pain. To make this more clear, we have changed our name to reflect what we do and offer. We will continue to offer medication management assessment and recommendations, but we will not be taking over any patient's medication management.  ____________________________________________________________________________________________

## 2022-06-14 DIAGNOSIS — R635 Abnormal weight gain: Secondary | ICD-10-CM | POA: Diagnosis not present

## 2022-06-14 DIAGNOSIS — J029 Acute pharyngitis, unspecified: Secondary | ICD-10-CM | POA: Diagnosis not present

## 2022-06-14 DIAGNOSIS — E213 Hyperparathyroidism, unspecified: Secondary | ICD-10-CM | POA: Diagnosis not present

## 2022-06-16 ENCOUNTER — Telehealth (INDEPENDENT_AMBULATORY_CARE_PROVIDER_SITE_OTHER): Payer: BC Managed Care – PPO | Admitting: Licensed Clinical Social Worker

## 2022-06-16 DIAGNOSIS — F4323 Adjustment disorder with mixed anxiety and depressed mood: Secondary | ICD-10-CM | POA: Diagnosis not present
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
## 2022-06-20 ENCOUNTER — Encounter: Payer: Self-pay | Admitting: Family Medicine

## 2022-06-20 ENCOUNTER — Ambulatory Visit: Payer: BC Managed Care – PPO | Admitting: Urology

## 2022-06-20 ENCOUNTER — Ambulatory Visit: Payer: BC Managed Care – PPO | Admitting: Family Medicine

## 2022-06-20 VITALS — BP 136/80 | HR 110 | Ht 69.0 in | Wt 286.0 lb

## 2022-06-20 VITALS — BP 140/81 | HR 120 | Ht 69.0 in | Wt 284.1 lb

## 2022-06-20 DIAGNOSIS — Z7689 Persons encountering health services in other specified circumstances: Secondary | ICD-10-CM | POA: Diagnosis not present

## 2022-06-20 DIAGNOSIS — J329 Chronic sinusitis, unspecified: Secondary | ICD-10-CM | POA: Diagnosis not present

## 2022-06-20 DIAGNOSIS — N3946 Mixed incontinence: Secondary | ICD-10-CM

## 2022-06-20 DIAGNOSIS — N39498 Other specified urinary incontinence: Secondary | ICD-10-CM

## 2022-06-20 LAB — URINALYSIS, COMPLETE
Bilirubin, UA: NEGATIVE
Glucose, UA: NEGATIVE
Ketones, UA: NEGATIVE
Nitrite, UA: NEGATIVE
RBC, UA: NEGATIVE
Specific Gravity, UA: 1.02 (ref 1.005–1.030)
Urobilinogen, Ur: 1 mg/dL (ref 0.2–1.0)
pH, UA: 7 (ref 5.0–7.5)

## 2022-06-20 LAB — MICROSCOPIC EXAMINATION: Epithelial Cells (non renal): >10 /hpf — AB (ref 0–10)

## 2022-06-20 MED ORDER — AZELASTINE HCL 0.1 % NA SOLN: 2.0000 | Freq: Two times a day (BID) | NASAL | 1 refills | Status: DC

## 2022-06-20 MED ORDER — METFORMIN HCL ER 750 MG PO TB24: 750.0000 mg | ORAL_TABLET | Freq: Every day | ORAL | 0 refills | Status: DC

## 2022-06-20 MED ORDER — AZITHROMYCIN 250 MG PO TABS: ORAL_TABLET | ORAL | 0 refills | Status: AC

## 2022-06-20 MED ORDER — PHENTERMINE HCL 15 MG PO CAPS: 15.0000 mg | ORAL_CAPSULE | ORAL | 0 refills | Status: DC

## 2022-06-20 MED ORDER — TOPIRAMATE 25 MG PO TABS: 25.0000 mg | ORAL_TABLET | Freq: Two times a day (BID) | ORAL | 1 refills | Status: DC

## 2022-06-20 MED ORDER — PROMETHAZINE-DM 6.25-15 MG/5ML PO SYRP: 5.0000 mL | ORAL_SOLUTION | Freq: Four times a day (QID) | ORAL | 0 refills | Status: DC | PRN
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
## 2022-06-20 NOTE — Assessment & Plan Note (Signed)
Acute issue, noted over the past 1 week in the setting of travel to visit her new granddaughter.  Initially with facial pressure, significant congestion, cough, chills.  Denies any fevers.  Did go to urgent care where she was swabbed for strep which came back negative, has been dosing Claritin and intranasal steroid without significant response.  Examination with benign cardiopulmonary sounds demonstrating clear lung fields without wheezes, rales, rhonchi, reassuring O2 sats, oropharynx, tympanic membranes, canals benign, nasal turbinates with mild erythema and swelling, nontender sinuses.  Infectious etiology versus allergic a primary concern, plan for Astelin nasal spray, if not improving I have encouraged patient to start azithromycin course no later than day 10 of symptoms, as needed promethazine-DM prescribed as well.

## 2022-06-20 NOTE — Assessment & Plan Note (Signed)
Chronic condition, ongoing symptomatology.  Is amenable to a restart of phentermine-topiramate written separately, adjunct metformin prescribed as well.  Will coordinate a follow-up in 2 months.  I have encouraged calorie counting and meal tracking to add objectivity to current plan.  She has plans to start gradual exercise.  Additionally workup through endocrinology ongoing.

## 2022-06-20 NOTE — Addendum Note (Signed)
Addended by: Kris Mouton on: 06/20/2022 01:18 PM   Modules accepted: Orders

## 2022-06-20 NOTE — Addendum Note (Signed)
Addended by: Kris Mouton on: 06/20/2022 01:25 PM   Modules accepted: Orders

## 2022-06-20 NOTE — Progress Notes (Unsigned)
Primary Care / Sports Medicine Office Visit  Patient Information:  Patient ID: Jamie Reeves, female DOB: [birth year] Age: 52 y.o. MRN: [id number]   Jamie Reeves is a pleasant 52 y.o. female presenting with the following:  Chief Complaint  Patient presents with   Nasal Congestion    1 week ago, went to UC was told negative for strep. States it feels like it is getting worst. Nasal congestion is the workst    Vitals:   06/20/22 1615  BP: 136/80  Pulse: (!) 110  SpO2: 99%   Vitals:   06/20/22 1615  Height: 5\' 9"  (1.753 m)   Body mass index is 41.96 kg/m.  No results found.   Independent interpretation of notes and tests performed by another provider:   None  Procedures performed:   None  Pertinent History, Exam, Impression, and Recommendations:   [name] was seen today for nasal congestion.  Rhinosinusitis Assessment & Plan: Acute issue, noted over the past 1 week in the setting of travel to visit her new granddaughter.  Initially with facial pressure, significant congestion, cough, chills.  Denies any fevers.  Did go to urgent care where she was swabbed for strep which came back negative, has been dosing Claritin and intranasal steroid without significant response.  Examination with benign cardiopulmonary sounds demonstrating clear lung fields without wheezes, rales, rhonchi, reassuring O2 sats, oropharynx, tympanic membranes, canals benign, nasal turbinates with mild erythema and swelling, nontender sinuses.  Infectious etiology versus allergic a primary concern, plan for Astelin nasal spray, if not improving I have encouraged patient to start azithromycin course no later than day 10 of symptoms, as needed promethazine-DM prescribed as well.  Orders: -     Azelastine HCl; Place 2 sprays into both nostrils 2 (two) times daily. Use in each nostril as directed  Dispense: 301 mL; Refill: 1 -     Azithromycin; Take 2 tablets on day 1, then 1 tablet daily on days  2 through 5  Dispense: 6 tablet; Refill: 0 -     Promethazine-DM; Take 5 mLs by mouth 4 (four) times daily as needed for cough.  Dispense: 118 mL; Refill: 0  Encounter for weight management Assessment & Plan: Chronic condition, ongoing symptomatology.  Is amenable to a restart of phentermine-topiramate written separately, adjunct metformin prescribed as well.  Will coordinate a follow-up in 2 months.  I have encouraged calorie counting and meal tracking to add objectivity to current plan.  She has plans to start gradual exercise.  Additionally workup through endocrinology ongoing.  Orders: -     Topiramate; Take 1 tablet (25 mg total) by mouth 2 (two) times daily.  Dispense: 60 tablet; Refill: 1 -     Phentermine HCl; Take 1 capsule (15 mg total) by mouth every morning.  Dispense: 30 capsule; Refill: 0     Orders & Medications Meds ordered this encounter  Medications   topiramate (TOPAMAX) 25 MG tablet    Sig: Take 1 tablet (25 mg total) by mouth 2 (two) times daily.    Dispense:  60 tablet    Refill:  1   phentermine 15 MG capsule    Sig: Take 1 capsule (15 mg total) by mouth every morning.    Dispense:  30 capsule    Refill:  0   azelastine (ASTELIN) 0.1 % nasal spray    Sig: Place 2 sprays into both nostrils 2 (two) times daily. Use in each nostril as directed    Dispense:  301 mL    Refill:  1    Use generic Astelin   azithromycin (ZITHROMAX) 250 MG tablet    Sig: Take 2 tablets on day 1, then 1 tablet daily on days 2 through 5    Dispense:  6 tablet    Refill:  0   promethazine-dextromethorphan (PROMETHAZINE-DM) 6.25-15 MG/5ML syrup    Sig: Take 5 mLs by mouth 4 (four) times daily as needed for cough.    Dispense:  118 mL    Refill:  0   No orders of the defined types were placed in this encounter.    No follow-ups on file.     Montel Culver, MD, [facility]   Primary Care Sports Medicine Primary Care and Sports Medicine at [facility]

## 2022-06-20 NOTE — Patient Instructions (Addendum)
My scripts Phone: [phone]  -Use Astelin nasal spray as prescribed x 7 days and as needed - If symptoms fail to improve after a maximum of 3 days, start antibiotics and take for full course - Continue current steroid nasal spray and Claritin   For weight management: - Download my fitness pal calorie tracking app and utilize for a few weeks - Restart phentermine daily, take on empty stomach 30 minutes before first meal - Restart topiramate daily x 1 week then advance to twice daily thereafter - Start metformin daily, take with food - Return in 2 months for follow-up

## 2022-06-21 ENCOUNTER — Other Ambulatory Visit: Payer: Self-pay | Admitting: Family Medicine

## 2022-06-21 DIAGNOSIS — Z7689 Persons encountering health services in other specified circumstances: Secondary | ICD-10-CM

## 2022-06-21 MED ORDER — PHENTERMINE HCL 37.5 MG PO TABS: ORAL_TABLET | ORAL | 0 refills | Status: DC

## 2022-06-21 NOTE — Telephone Encounter (Signed)
Please advise 

## 2022-06-23 LAB — CULTURE, URINE COMPREHENSIVE

## 2022-06-28 ENCOUNTER — Telehealth (INDEPENDENT_AMBULATORY_CARE_PROVIDER_SITE_OTHER): Payer: BC Managed Care – PPO | Admitting: Licensed Clinical Social Worker

## 2022-06-28 DIAGNOSIS — F4323 Adjustment disorder with mixed anxiety and depressed mood: Secondary | ICD-10-CM | POA: Diagnosis not present

## 2022-06-28 NOTE — Progress Notes (Signed)
THERAPIST PROGRESS NOTE  Session Time: 1:00pm-1:30pm    Virtual Visit via Video Note  I connected with Jamie Reeves on 06/28/22 at  1:00 PM EST by a video enabled telemedicine application and verified that I am speaking with the correct person using two identifiers.  Location: Patient: home Provider: [place] office    I discussed the limitations of evaluation and management by telemedicine and the availability of in person appointments. The patient expressed understanding and agreed to proceed.    I discussed the assessment and treatment plan with the patient. The patient was provided an opportunity to ask questions and all were answered. The patient agreed with the plan and demonstrated an understanding of the instructions.   The patient was advised to call back or seek an in-person evaluation if the symptoms worsen or if the condition fails to improve as anticipated.  I provided 30 minutes of non-face-to-face time during this encounter.   Lorenda Hatchet   Participation Level: Active  Behavioral Response: Well GroomedAlertAnxious  Type of Therapy: Individual Therapy  Treatment Goals addressed: Anxiety: Reduce overall frequency, intensity and duration of anxiety so that daily functioning is not impaired per pt self report 3 out of 5 sessions.   Depression:Reduce overall frequency, intensity and duration of depression so that daily functioning is not impaired per pt self report 3 out of 5 sessions documented.     Intervention: Learn and Implement coping skills that result in the reduction of anxiety and worry and improve daily functioning per pt self report 3 out of 5 documented sessions.    Intervention: Will work with pt to learn and implement calming skills to reduce overall depression and manage depression symptoms. Some of the techniques that will be used during session will be deep breathing exercises, visual imagery, progressive muscle relaxation, postreduction relative  to the benefits of self-care and other breathing exercises.    ProgressTowards Goals: Progressing  Interventions: CBT, Solution Focused, and Supportive  Pt presented virtually for a follow-up therapy visit. LCSW and pt were present during the visit. Katie Bounds, LCSW was present during the virtual visit.   Summary: Jamie Reeves is a 52 y.o. female who presents with continuing symptoms of anxiety and depression. Pt lives with her fiance, her fiance's daughter and her husband. Pt stated that she has been having feeling of anxiety since the last visit because she has not had any changes happen with the household and her fiance's daughter living there and not helping . Pt stated that she does not feel that she can communicate to her how she feels because she does not want to hurt her feelings. Pt stated that she was wanting to have a conversation with her fiance and his daughter and that she feels that she would be able to communicate her needs and concerns.   Pt stated that her fiance is supportive and that she is able to express how she feels.  Pt stated that in the past she went to the gym and that she is going to try and get back into the gym and stated that she is planning on going soon to the gym because it was helpful in the past.   Allowed pt to explore thoughts and feelings associated with life situations and external stressors. Encouraged expression of feelings and used empathic listening. Pt was oriented to time, place and situation. LCSW validated the pts feelings and thoughts and showed unconditional positive regard.   LCSW provided mood monitoring and treatment progress  review in the context of this episode of treatment. LCSW reviewed the pt's mood status since last session. Pt reported that her mood has been "good" and that one stressor she has is being in the same home as her fiance's daughter and not feeling like she can say anything to her about cleaning and maintaining the home.    Pt stated that she would try to use the 5-4-3-2-1 technique discussed in session and that she felt it could be helpful in keeping her grounded and in the moment. Pt was engaged during the mindfulness exercise and stated that she will continue to find ways to cope in a healthy manner. Pt stated that she enjoys listening to music.    Pt denies SI/HI or A/V hallucinations. Pt was cooperative during visit and was engaged throughout the visit. Pt does not report any other concerns at the time of visit.    Continued Recommendations as followed: Self-care behaviors, positive social engagements, focusing on positive physical and emotional wellness, and focusing on life/work balance.    Pt is continuing to apply interventions/techniques learned in session into daily life situations. Pt is currently on track to meet goals utilizing interventions that are discussed in session. Treatment to continue as indicated. Personal growth and progress toward goals noted above.   Suicidal/Homicidal: Nowithout intent/plan  Therapist Response:.Educated on grounding technique 5-4-3-2-1 technique to help control difficult emotions and symptoms by turning attention away from those uncomfortable thoughts, memories or worries and focusing on the present moment. Using the 5-4-3-2-1 technique to take in the details of their surrounding by using the five senses. Explored each step with the pt and engaged the pt to try the technique at home when they are facing difficult emotions. What are five things that you can see by exploring small details in their environment to include an object, the way that the light reflects off another surface or any other detail of what they see. Next exploring four things that you can feel. Noticing the sensations of how an object feels, how is the chair that they are sitting on feels, how does sun feel on your skin, tuning in to the different sensations of touch. Exploring three things that you can hear  to include the sound of the wind, the sound of a clock and other surrounding sounds that are in the environment you are in. Then noticing two things that the pt can smell, to include the air around then do they smell fresh cut grass, a candle or food. Lastly, exploring taste and if there is anything they can taste to include gum, food or another flavors. Engaged with the pt to strive to notice the small details of this technique that usually they would tune out such as distant noises and objects that they see on a daily basis. LCSW encouraged the pt to explore their thoughts and feelings about the technique and how it could be helpful to allow the pt to be present and mindful in the moment.    LCSW used CBT techniques to explore how thoughts,feelings and beliefs impact behavioral responses and physical symptoms. Explored how stressors can lead to negative thoughts that can be irrational or exaggerated which in return impacts how the person feels and can negatively impact how their body responds to include fatigue, sleep problems, poor concentration and loss of motivation. Explored how a behavioral response can create new stressors. Engaged with the pt to explore how their thoughts and feelings impact their behaviors and choices.  Educated  on self-care and the importance of self-care.    Plan: Discussed with the pt the transition of a new therapist and provided the pt with the choice of continuing services with the new therapist and answered any questions that the pt had about the transition.  Pt agreed to the transition to [employer]. Pt stated at this time she wants virtual visits.   Diagnosis:  Encounter Diagnosis  Name Primary?   Adjustment disorder with mixed anxiety and depressed mood Yes       Collaboration of Care: Chart Review in Epic.   Patient/Guardian was advised Release of Information must be obtained prior to any record release in order to collaborate their care with an outside  provider. Patient/Guardian was advised if they have not already done so to contact the registration department to sign all necessary forms in order for [place] to release information regarding their care.   Consent: Patient/Guardian gives verbal consent for treatment and assignment of benefits for services provided during this visit. Patient/Guardian expressed understanding and agreed to proceed.   Lorenda Hatchet 06/28/2022

## 2022-07-01 DIAGNOSIS — R635 Abnormal weight gain: Secondary | ICD-10-CM | POA: Diagnosis not present

## 2022-07-01 DIAGNOSIS — R7989 Other specified abnormal findings of blood chemistry: Secondary | ICD-10-CM | POA: Diagnosis not present

## 2022-07-12 ENCOUNTER — Encounter: Payer: Self-pay | Admitting: Family Medicine

## 2022-07-12 NOTE — Telephone Encounter (Signed)
Please review.  KP

## 2022-07-13 ENCOUNTER — Telehealth: Payer: Self-pay

## 2022-07-13 DIAGNOSIS — N3946 Mixed incontinence: Secondary | ICD-10-CM | POA: Diagnosis not present

## 2022-07-13 NOTE — Telephone Encounter (Signed)
Need results.

## 2022-07-15 NOTE — Telephone Encounter (Signed)
Please advise on the non coverage

## 2022-07-19 ENCOUNTER — Other Ambulatory Visit: Payer: Self-pay

## 2022-07-19 DIAGNOSIS — Z7689 Persons encountering health services in other specified circumstances: Secondary | ICD-10-CM

## 2022-07-19 DIAGNOSIS — J329 Chronic sinusitis, unspecified: Secondary | ICD-10-CM

## 2022-07-19 DIAGNOSIS — K219 Gastro-esophageal reflux disease without esophagitis: Secondary | ICD-10-CM

## 2022-07-19 DIAGNOSIS — G2581 Restless legs syndrome: Secondary | ICD-10-CM

## 2022-07-19 DIAGNOSIS — M47817 Spondylosis without myelopathy or radiculopathy, lumbosacral region: Secondary | ICD-10-CM

## 2022-07-19 MED ORDER — GABAPENTIN 300 MG PO CAPS: ORAL_CAPSULE | ORAL | 3 refills | Status: DC

## 2022-07-19 MED ORDER — DULOXETINE HCL 60 MG PO CPEP: ORAL_CAPSULE | ORAL | 0 refills | Status: DC

## 2022-07-19 MED ORDER — AZELASTINE HCL 0.1 % NA SOLN: 2.0000 | Freq: Two times a day (BID) | NASAL | 1 refills | Status: DC

## 2022-07-19 MED ORDER — ROPINIROLE HCL 0.25 MG PO TABS: ORAL_TABLET | ORAL | 0 refills | Status: DC

## 2022-07-19 MED ORDER — TOPIRAMATE 25 MG PO TABS: 25.0000 mg | ORAL_TABLET | Freq: Two times a day (BID) | ORAL | 1 refills | Status: DC

## 2022-07-19 MED ORDER — PANTOPRAZOLE SODIUM 40 MG PO TBEC: DELAYED_RELEASE_TABLET | ORAL | 0 refills | Status: DC

## 2022-07-22 NOTE — Telephone Encounter (Signed)
Please review.  KP

## 2022-07-25 ENCOUNTER — Encounter: Payer: Self-pay | Admitting: Urology

## 2022-07-25 ENCOUNTER — Ambulatory Visit: Payer: BC Managed Care – PPO | Admitting: Urology

## 2022-07-25 VITALS — BP 123/81 | HR 114 | Ht 69.0 in | Wt 270.0 lb

## 2022-07-25 DIAGNOSIS — N3946 Mixed incontinence: Secondary | ICD-10-CM

## 2022-07-25 LAB — MICROSCOPIC EXAMINATION: Epithelial Cells (non renal): >10 /hpf — AB (ref 0–10)

## 2022-07-25 LAB — URINALYSIS, COMPLETE
Bilirubin, UA: NEGATIVE
Glucose, UA: NEGATIVE
Ketones, UA: NEGATIVE
Leukocytes,UA: NEGATIVE
Nitrite, UA: NEGATIVE
Protein,UA: NEGATIVE
RBC, UA: NEGATIVE
Specific Gravity, UA: >1.03 — ABNORMAL HIGH (ref 1.005–1.030)
Urobilinogen, Ur: 0.2 mg/dL (ref 0.2–1.0)
pH, UA: 5.5 (ref 5.0–7.5)

## 2022-07-25 MED ORDER — MIRABEGRON ER 50 MG PO TB24: 50.0000 mg | ORAL_TABLET | Freq: Every day | ORAL | 11 refills | Status: DC

## 2022-07-25 MED ORDER — FLUCONAZOLE 100 MG PO TABS: 100.0000 mg | ORAL_TABLET | Freq: Every day | ORAL | 0 refills | Status: DC

## 2022-07-25 NOTE — Addendum Note (Signed)
Addended by: Despina Hidden on: 07/25/2022 03:17 PM   Modules accepted: Orders
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
## 2022-07-28 LAB — CULTURE, URINE COMPREHENSIVE

## 2022-08-02 ENCOUNTER — Telehealth: Payer: Self-pay | Admitting: Pain Medicine
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
## 2022-08-04 NOTE — Telephone Encounter (Signed)
Good morning,   I am sorry for the long delay regarding the results, you were not the only one. Apparently, around the time you had your procedure, the computers were updated and serviced. There were issues with missing data and not able to view and print results, reading the machine. I finally got the results.   Your DeMeester score was 1.9 on 1 day, and 2.4 on another day. Anything below 14 is normal   A DeMeester score is a composite score of the acid exposure during a prolonged pH monitoring   Dr Allen Norris said to continue Pantoprazole daily to manage acid control.    Are you still experiencing a lot of burping with nausea?     Kind Regards,   [place] for Dr. Allen Norris  Last read by Oleh Genin at 11:20 AM on 07/21/2022.

## 2022-08-05 ENCOUNTER — Encounter: Payer: Self-pay | Admitting: Pain Medicine

## 2022-08-06 ENCOUNTER — Other Ambulatory Visit: Payer: Self-pay | Admitting: Family Medicine

## 2022-08-06 DIAGNOSIS — M47817 Spondylosis without myelopathy or radiculopathy, lumbosacral region: Secondary | ICD-10-CM

## 2022-08-06 DIAGNOSIS — Z7689 Persons encountering health services in other specified circumstances: Secondary | ICD-10-CM

## 2022-08-08 NOTE — Telephone Encounter (Signed)
Unable to refill per protocol, Rx request is too soon.  Requested Prescriptions  Pending Prescriptions Disp Refills   gabapentin (NEURONTIN) 300 MG capsule [Pharmacy Med Name: GABAPENTIN 300MG  CAPSULES] 90 capsule 3    Sig: TAKE 1 CAPSULE(300 MG) BY MOUTH THREE TIMES DAILY     Neurology: Anticonvulsants - gabapentin Passed - 08/06/2022  6:22 AM      Passed - Cr in normal range and within 360 days    Creatinine, Ser  Date Value Ref Range Status  04/18/2022 0.83 0.57 - 1.00 mg/dL Final         Passed - Completed PHQ-2 or PHQ-9 in the last 360 days      Passed - Valid encounter within last 12 months    Recent Outpatient Visits           1 month ago [place] at [place], Earley Abide, MD   3 months ago Encounter for weight management   [place] at [place], Earley Abide, MD   4 months ago Encounter for weight management   [place] [facility] [place] at [facility], Earley Abide, MD   5 months ago Encounter for weight management   [place] at [facility], Earley Abide, MD   6 months ago Bilateral lower extremity edema   [place] at [facility], Earley Abide, MD       Future Appointments             In 1 week Zigmund Daniel, Earley Abide, MD [place] at [facility], [place]   In 4 weeks Bjorn Loser, [facility]              topiramate (TOPAMAX) 25 MG tablet [Pharmacy Med Name: TOPIRAMATE 25MG  TABLETS] 180 tablet     Sig: TAKE 1 TABLET(25 MG) BY MOUTH TWICE DAILY     Neurology: Anticonvulsants - topiramate & zonisamide Failed - 08/06/2022  6:22 AM      Failed - ALT in normal range and within 360 days    ALT  Date Value Ref Range Status  04/18/2022 37 (H) 0 - 32 IU/L Final         Passed - Cr in normal  range and within 360 days    Creatinine, Ser  Date Value Ref Range Status  04/18/2022 0.83 0.57 - 1.00 mg/dL Final         Passed - CO2 in normal range and within 360 days    CO2  Date Value Ref Range Status  04/18/2022 22 20 - 29 mmol/L Final         Passed - AST in normal range and within 360 days    AST  Date Value Ref Range Status  04/18/2022 35 0 - 40 IU/L Final         Passed - Completed PHQ-2 or PHQ-9 in the last 360 days      Passed - Valid encounter within last 12 months    Recent Outpatient Visits           1 month ago Rhinosinusitis   [facility] [place] [facility] [place], MD   3 months ago Encounter for weight management   [place] at [facility]  Montel Culver, MD   4 months ago Encounter for weight management   [place] at [facility], Earley Abide, MD   5 months ago Encounter for weight management   [place] at [place], Earley Abide, MD   6 months ago Bilateral lower extremity edema   [facility] [place] at [facility], Earley Abide, MD       Future Appointments             In 1 week Zigmund Daniel, Earley Abide, MD [place] at [facility], [facility]   In 4 weeks Bjorn Loser, [place]

## 2022-08-12 DIAGNOSIS — R635 Abnormal weight gain: Secondary | ICD-10-CM | POA: Diagnosis not present

## 2022-08-19 ENCOUNTER — Ambulatory Visit: Payer: BC Managed Care – PPO | Admitting: Family Medicine

## 2022-08-19 VITALS — BP 126/78 | HR 100 | Ht 69.0 in | Wt 288.0 lb

## 2022-08-19 DIAGNOSIS — F418 Other specified anxiety disorders: Secondary | ICD-10-CM

## 2022-08-19 DIAGNOSIS — R6889 Other general symptoms and signs: Secondary | ICD-10-CM | POA: Insufficient documentation

## 2022-08-19 DIAGNOSIS — F419 Anxiety disorder, unspecified: Secondary | ICD-10-CM

## 2022-08-19 DIAGNOSIS — Z7689 Persons encountering health services in other specified circumstances: Secondary | ICD-10-CM

## 2022-08-19 MED ORDER — TOPIRAMATE 50 MG PO TABS: 50.0000 mg | ORAL_TABLET | Freq: Two times a day (BID) | ORAL | 2 refills | Status: DC

## 2022-08-19 NOTE — Patient Instructions (Signed)
-   Contact us with in-network group for bariatrics - Increase topiramate with new Rx - Referral coordinator will contact you to schedule psychiatry - Review information attached

## 2022-08-20 LAB — T3, FREE: T3, Free: 3 pg/mL (ref 2.0–4.4)

## 2022-08-20 LAB — TSH: TSH: 1.28 u[IU]/mL (ref 0.450–4.500)

## 2022-08-20 LAB — FSH/LH
FSH: 4 m[IU]/mL
LH: 10.5 m[IU]/mL

## 2022-08-20 LAB — T4, FREE: Free T4: 1.02 ng/dL (ref 0.82–1.77)

## 2022-08-20 LAB — PROLACTIN: Prolactin: 19.3 ng/mL (ref 3.6–25.2)

## 2022-08-21 ENCOUNTER — Encounter: Payer: Self-pay | Admitting: Urology

## 2022-08-21 ENCOUNTER — Encounter: Payer: Self-pay | Admitting: Family Medicine

## 2022-08-21 NOTE — Assessment & Plan Note (Addendum)
Interim worsening, brings up several family and work-related stressors, compliant on current medications.  - Place referral for psychiatry for tighter management options. - Continue current regimen - Encouraged adjunct non-medication based treatments (patient information attached)

## 2022-08-21 NOTE — Assessment & Plan Note (Signed)
Noted over months, progressive worsening, questions menopause.  - Order gyn and thyroid hormone labs

## 2022-08-21 NOTE — Assessment & Plan Note (Signed)
Has tolerated current medications without issue but has had interim weight gain. Discussed role of comorbid stress / anxiety-depression playing a role at length as well as next steps as follows:  - Patient to contact us regarding which group is in network for bariatric surgery - increase topiramate

## 2022-08-21 NOTE — Progress Notes (Signed)
     Primary Care / Sports Medicine Office Visit  Patient Information:  Patient ID: Jamie Reeves, female DOB: 09-13-1970 Age: 52 y.o. MRN: [id number]   Jamie Reeves is a pleasant 52 y.o. female presenting with the following:  Chief Complaint  Patient presents with   Weight Check    Vitals:   08/19/22 1600  BP: 126/78  Pulse: 100  SpO2: 96%   Vitals:   08/19/22 1600  Weight: 288 lb (130.6 kg)  Height: 5\' 9"  (1.753 m)   Body mass index is 42.53 kg/m.  No results found.   Independent interpretation of notes and tests performed by another provider:   None  Procedures performed:   None  Pertinent History, Exam, Impression, and Recommendations:   Jamie Reeves was seen today for weight check.  Heat intolerance Assessment & Plan: Noted over months, progressive worsening, questions menopause.  - Order gyn and thyroid hormone labs  Orders: -     TSH -     T3, free -     T4, free -     FSH/LH -     Prolactin  Encounter for weight management Assessment & Plan: Has tolerated current medications without issue but has had interim weight gain. Discussed role of comorbid stress / anxiety-depression playing a role at length as well as next steps as follows:  - Patient to contact us regarding which group is in network for bariatric surgery - increase topiramate  Orders: -     Topiramate; Take 1 tablet (50 mg total) by mouth 2 (two) times daily.  Dispense: 60 tablet; Refill: 2  Depression with anxiety Assessment & Plan: Interim worsening, brings up several family and work-related stressors, compliant on current medications.  - Place referral for psychiatry for tighter management options. - Continue current regimen - Encouraged adjunct non-medication based treatments (patient information attached)  Orders: -     Ambulatory referral to Psychiatry     Orders & Medications Meds ordered this encounter  Medications   topiramate (TOPAMAX) 50 MG tablet    Sig:  Take 1 tablet (50 mg total) by mouth 2 (two) times daily.    Dispense:  60 tablet    Refill:  2   Orders Placed This Encounter  Procedures   TSH   T3, free   T4, free   FSH/LH   Prolactin   Ambulatory referral to Psychiatry     No follow-ups on file.     Montel Culver, MD, [facility]   Primary Care Sports Medicine Primary Care and Sports Medicine at [facility]

## 2022-08-21 NOTE — Assessment & Plan Note (Signed)
Interim worsening, brings up several family and work-related stressors, compliant on current medications.  - Place referral for psychiatry for tighter management options. - Continue current regimen - Encouraged adjunct non-medication based treatments (patient information attached)

## 2022-08-23 ENCOUNTER — Encounter: Payer: Self-pay | Admitting: Family Medicine

## 2022-08-23 NOTE — Telephone Encounter (Signed)
Please advise 

## 2022-09-05 ENCOUNTER — Other Ambulatory Visit: Payer: Self-pay | Admitting: Family Medicine

## 2022-09-05 ENCOUNTER — Ambulatory Visit: Payer: BC Managed Care – PPO | Admitting: Urology

## 2022-09-05 DIAGNOSIS — Z7689 Persons encountering health services in other specified circumstances: Secondary | ICD-10-CM

## 2022-09-06 NOTE — Telephone Encounter (Signed)
No longer taking this medication, will refuse.  Requested Prescriptions  Pending Prescriptions Disp Refills   metFORMIN (GLUCOPHAGE-XR) 750 MG 24 hr tablet [Pharmacy Med Name: METFORMIN ER  24HR TABS] 90 tablet 0    Sig: TAKE 1 TABLET(750 MG) BY MOUTH DAILY WITH BREAKFAST     Endocrinology:  Diabetes - Biguanides Failed - 09/05/2022  6:19 AM      Failed - HBA1C is between 0 and 7.9 and within 180 days    No results found for: "HGBA1C", "LABA1C"       Failed - CBC within normal limits and completed in the last 12 months    WBC  Date Value Ref Range Status  04/18/2022 8.5 3.4 - 10.8 x10E3/uL Final   RBC  Date Value Ref Range Status  04/18/2022 4.85 3.77 - 5.28 x10E6/uL Final   Hemoglobin  Date Value Ref Range Status  04/18/2022 13.3 11.1 - 15.9 g/dL Final   Hematocrit  Date Value Ref Range Status  04/18/2022 41.0 34.0 - 46.6 % Final   MCHC  Date Value Ref Range Status  04/18/2022 32.4 31.5 - 35.7 g/dL Final   [facility]  Date Value Ref Range Status  04/18/2022 27.4 26.6 - 33.0 pg Final   MCV  Date Value Ref Range Status  04/18/2022 85 79 - 97 fL Final   No results found for: "PLTCOUNTKUC", "LABPLAT", "POCPLA" RDW  Date Value Ref Range Status  04/18/2022 14.0 11.7 - 15.4 % Final         Passed - Cr in normal range and within 360 days    Creatinine, Ser  Date Value Ref Range Status  04/18/2022 0.83 0.57 - 1.00 mg/dL Final         Passed - eGFR in normal range and within 360 days    eGFR  Date Value Ref Range Status  04/18/2022 86 >59 mL/min/1.73 Final         Passed - B12 Level in normal range and within 720 days    Vitamin B-12  Date Value Ref Range Status  03/14/2022 594 232 - 1,245 pg/mL Final         Passed - Valid encounter within last 6 months    Recent Outpatient Visits           2 weeks ago Heat intolerance   [place] [facility] at [facility] [place] [facility], Ocie Bob, MD   2 months ago Rhinosinusitis   [facility]  Care & Sports [facility] at [facility], Ocie Bob, MD   4 months ago Encounter for weight management   [place] [facility] at [facility], Ocie Bob, MD   5 months ago Encounter for weight management   [place] [facility] at [facility], Ocie Bob, MD   6 months ago Encounter for weight management   [facility] Primary Care & Sports Medicine at [facility], Ocie Bob, MD

## 2022-09-19 ENCOUNTER — Ambulatory Visit
Admission: RE | Admit: 2022-09-19 | Discharge: 2022-09-19 | Disposition: A | Discharge status: Home / Self Care | Payer: BC Managed Care – PPO | Source: Ambulatory Visit | Attending: Family Medicine | Admitting: Family Medicine

## 2022-09-19 DIAGNOSIS — Z1231 Encounter for screening mammogram for malignant neoplasm of breast: Secondary | ICD-10-CM | POA: Diagnosis not present

## 2022-09-20 ENCOUNTER — Other Ambulatory Visit: Payer: Self-pay | Admitting: Family Medicine

## 2022-09-20 DIAGNOSIS — F418 Other specified anxiety disorders: Secondary | ICD-10-CM

## 2022-09-21 NOTE — Telephone Encounter (Signed)
Requested medication (s) are due for refill today - yes  Requested medication (s) are on the active medication list -yes  Future visit scheduled - no  Last refill: 04/18/22 #60 2RF  Notes to clinic: non delegated Rx  Requested Prescriptions  Pending Prescriptions Disp Refills   AUVELITY 45-105 MG TBCR [Pharmacy Med Name: AUVELITY 45-105MG  TABLET ER] 60 tablet 2    Sig: TAKE ONE (1) TABLET BY MOUTH IN THE MORNING AND AT BEDTIME.     Off-Protocol Failed - 09/20/2022 10:41 AM      Failed - Medication not assigned to a protocol, review manually.      Passed - Valid encounter within last 12 months    Recent Outpatient Visits           1 month ago Heat intolerance   [place] [facility] at [facility], Ocie Bob, MD   3 months ago Rhinosinusitis   [facility] & Sports Medicine at [facility], Ocie Bob, MD   5 months ago Encounter for weight management   [place] [facility] at [facility], Ocie Bob, MD   6 months ago Encounter for weight management   [place] [facility] at [facility], Ocie Bob, MD   7 months ago Encounter for weight management   [place] [facility] at [facility], Ocie Bob, MD                 Requested Prescriptions  Pending Prescriptions Disp Refills   AUVELITY 45-105 MG TBCR [Pharmacy Med Name: AUVELITY 45-105MG  TABLET ER] 60 tablet 2    Sig: TAKE ONE (1) TABLET BY MOUTH IN THE MORNING AND AT BEDTIME.     Off-Protocol Failed - 09/20/2022 10:41 AM      Failed - Medication not assigned to a protocol, review manually.      Passed - Valid encounter within last 12 months    Recent Outpatient Visits           1 month ago Heat intolerance   [place] [facility] at [facility], Ocie Bob, MD   3 months ago Rhinosinusitis   [facility] Care & Sports  Medicine at [facility], Ocie Bob, MD   5 months ago Encounter for weight management   [facility] Care & Sports Medicine at [facility], Ocie Bob, MD   6 months ago Encounter for weight management   [facility] Primary Care & Sports Medicine at [facility], Ocie Bob, MD   7 months ago Encounter for weight management   [facility] Primary Care & Sports Medicine at [facility], Ocie Bob, MD

## 2022-09-22 DIAGNOSIS — E213 Hyperparathyroidism, unspecified: Secondary | ICD-10-CM | POA: Diagnosis not present

## 2022-09-22 DIAGNOSIS — R7303 Prediabetes: Secondary | ICD-10-CM | POA: Diagnosis not present

## 2022-09-22 DIAGNOSIS — R635 Abnormal weight gain: Secondary | ICD-10-CM | POA: Diagnosis not present

## 2022-09-26 DIAGNOSIS — F411 Generalized anxiety disorder: Secondary | ICD-10-CM | POA: Diagnosis not present

## 2022-09-26 DIAGNOSIS — F330 Major depressive disorder, recurrent, mild: Secondary | ICD-10-CM | POA: Diagnosis not present

## 2022-09-29 ENCOUNTER — Telehealth: Payer: Self-pay | Admitting: Family Medicine

## 2022-09-29 ENCOUNTER — Other Ambulatory Visit: Payer: Self-pay

## 2022-09-29 ENCOUNTER — Ambulatory Visit: Payer: BC Managed Care – PPO | Admitting: Family Medicine

## 2022-09-29 ENCOUNTER — Ambulatory Visit
Admission: RE | Admit: 2022-09-29 | Discharge: 2022-09-29 | Disposition: A | Discharge status: Home / Self Care | Payer: BC Managed Care – PPO | Attending: Family Medicine | Admitting: Family Medicine

## 2022-09-29 ENCOUNTER — Ambulatory Visit
Admission: RE | Admit: 2022-09-29 | Discharge: 2022-09-29 | Disposition: A | Discharge status: Home / Self Care | Payer: BC Managed Care – PPO | Source: Ambulatory Visit | Attending: Family Medicine | Admitting: Family Medicine

## 2022-09-29 ENCOUNTER — Encounter: Payer: Self-pay | Admitting: Family Medicine

## 2022-09-29 ENCOUNTER — Ambulatory Visit: Payer: Self-pay

## 2022-09-29 VITALS — BP 128/78 | HR 110 | Ht 69.0 in | Wt 288.0 lb

## 2022-09-29 DIAGNOSIS — R109 Unspecified abdominal pain: Secondary | ICD-10-CM

## 2022-09-29 DIAGNOSIS — E559 Vitamin D deficiency, unspecified: Secondary | ICD-10-CM | POA: Diagnosis not present

## 2022-09-29 DIAGNOSIS — Z6841 Body Mass Index (BMI) 40.0 and over, adult: Secondary | ICD-10-CM | POA: Diagnosis not present

## 2022-09-29 DIAGNOSIS — E213 Hyperparathyroidism, unspecified: Secondary | ICD-10-CM | POA: Diagnosis not present

## 2022-09-29 LAB — POCT URINALYSIS DIPSTICK
Bilirubin, UA: NEGATIVE
Blood, UA: NEGATIVE
Glucose, UA: NEGATIVE
Ketones, UA: NEGATIVE
Leukocytes, UA: NEGATIVE
Nitrite, UA: NEGATIVE
Protein, UA: NEGATIVE
Spec Grav, UA: 1.025 (ref 1.010–1.025)
Urobilinogen, UA: 0.2 E.U./dL
pH, UA: 6 (ref 5.0–8.0)

## 2022-09-29 MED ORDER — TIZANIDINE HCL 4 MG PO TABS
4.0000 mg | ORAL_TABLET | Freq: Three times a day (TID) | ORAL | 1 refills | Status: DC | PRN
Start: 2022-09-29 — End: 2022-12-02

## 2022-09-29 MED ORDER — TRULANCE 3 MG PO TABS
1.00 | ORAL_TABLET | Freq: Every day | ORAL | 0 refills | Status: DC
Start: 2022-09-29 — End: 2022-11-28

## 2022-09-29 NOTE — Telephone Encounter (Unsigned)
Pt is calling to report side pain. Lost connection. Attempted to call the patient back. Patient did not answer. Spoke with Arneisha in the office. Advised to schedule the patient tomorrow for same day appt. Please advise CB- [phone]

## 2022-09-29 NOTE — Telephone Encounter (Signed)
Duplicate message  KP 

## 2022-09-29 NOTE — Patient Instructions (Addendum)
-   Take muscle relaxer up to three times / day - Start Trulance 3 mg once daily. Symptoms generally improve by day 7  - Continue Miralax, lowest amount to keep bowels smooth and moving daily - Can use enema for persistent symptoms

## 2022-09-29 NOTE — Telephone Encounter (Signed)
Pt needs an appt.  KP

## 2022-09-29 NOTE — Telephone Encounter (Signed)
     Chief Complaint: Left flank pain. Pain 7/10, could not sleeo last night. Symptoms: Above Frequency: 3 days ago Pertinent Negatives: Patient denies injury Disposition: [] ED /[] Urgent Care (no appt availability in office) / [] Appointment(In office/virtual)/ []  [place] [facility]/ [] Home Care/ [] Refused Recommended Disposition /[] [place] [facility]/ [x]  Follow-up with PCP Additional Notes: Spoke with Amryn in the practice. Will call pt. Back.  Answer Assessment - Initial Assessment Questions 1. ONSET: "When did the pain begin?"      3 days ago 2. LOCATION: "Where does it hurt?" (upper, mid or lower back)     Left flank 3. SEVERITY: "How bad is the pain?"  (e.g., Scale 1-10; mild, moderate, or severe)   - MILD (1-3): Doesn't interfere with normal activities.    - MODERATE (4-7): Interferes with normal activities or awakens from sleep.    - SEVERE (8-10): Excruciating pain, unable to do any normal activities.      7 4. PATTERN: "Is the pain constant?" (e.g., yes, no; constant, intermittent)      Constant 5. RADIATION: "Does the pain shoot into your legs or somewhere else?"     No 6. CAUSE:  "What do you think is causing the back pain?"      Unsure 7. BACK OVERUSE:  "Any recent lifting of heavy objects, strenuous work or exercise?"     No 8. MEDICINES: "What have you taken so far for the pain?" (e.g., nothing, acetaminophen, NSAIDS)     Tylenol 9. NEUROLOGIC SYMPTOMS: "Do you have any weakness, numbness, or problems with bowel/bladder control?"     No 10. OTHER SYMPTOMS: "Do you have any other symptoms?" (e.g., fever, abdomen pain, burning with urination, blood in urine)       No 11. PREGNANCY: "Is there any chance you are pregnant?" "When was your last menstrual period?"       No  Protocols used: Back Pain-A-AH

## 2022-10-01 LAB — URINE CULTURE

## 2022-10-03 ENCOUNTER — Ambulatory Visit: Payer: BC Managed Care – PPO | Admitting: Urology

## 2022-10-03 ENCOUNTER — Encounter: Payer: Self-pay | Admitting: Urology

## 2022-10-03 VITALS — BP 118/84 | HR 105 | Ht 69.0 in | Wt 287.0 lb

## 2022-10-03 DIAGNOSIS — N3946 Mixed incontinence: Secondary | ICD-10-CM | POA: Diagnosis not present

## 2022-10-03 LAB — URINALYSIS, COMPLETE
Bilirubin, UA: NEGATIVE
Glucose, UA: NEGATIVE
Leukocytes,UA: NEGATIVE
Nitrite, UA: NEGATIVE
RBC, UA: NEGATIVE
Specific Gravity, UA: 1.025 (ref 1.005–1.030)
Urobilinogen, Ur: 1 mg/dL (ref 0.2–1.0)
pH, UA: 5.5 (ref 5.0–7.5)

## 2022-10-03 LAB — MICROSCOPIC EXAMINATION: Epithelial Cells (non renal): >10 /hpf — AB (ref 0–10)

## 2022-10-03 MED ORDER — GEMTESA 75 MG PO TABS
75.0000 mg | ORAL_TABLET | Freq: Every day | ORAL | 0 refills | Status: DC
Start: 2022-10-03 — End: 2022-12-21
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
## 2022-10-04 ENCOUNTER — Other Ambulatory Visit: Payer: Self-pay | Admitting: Family Medicine

## 2022-10-04 DIAGNOSIS — R109 Unspecified abdominal pain: Secondary | ICD-10-CM | POA: Insufficient documentation

## 2022-10-04 DIAGNOSIS — K219 Gastro-esophageal reflux disease without esophagitis: Secondary | ICD-10-CM

## 2022-10-04 NOTE — Telephone Encounter (Signed)
Requested Prescriptions  Pending Prescriptions Disp Refills   pantoprazole (PROTONIX) 40 MG tablet [Pharmacy Med Name: PANTOPRAZOLE SODIUM DR TABS 40MG ] 90 tablet 3    Sig: TAKE 1 TABLET DAILY     Gastroenterology: Proton Pump Inhibitors Passed - 10/04/2022 11:05 AM      Passed - Valid encounter within last 12 months    Recent Outpatient Visits           5 days ago Left flank pain   [place] [facility], Ocie Bob, MD   1 month ago Heat intolerance   [place] [facility] at [facility], Ocie Bob, MD   3 months ago Rhinosinusitis   [facility] & Sports Medicine at [facility], Ocie Bob, MD   5 months ago Encounter for weight management   [facility] Care & Sports Medicine at [facility], Ocie Bob, MD   6 months ago Encounter for weight management   [facility] Primary Care & Sports Medicine at [facility], Ocie Bob, MD       Future Appointments             In 1 month MacDiarmid, Lorin Picket, MD [facility]

## 2022-10-04 NOTE — Progress Notes (Signed)
     Primary Care / Sports Medicine Office Visit  Patient Information:  Patient ID: Jamie Reeves, female DOB: November 19, 1970 Age: 52 y.o. MRN: [id number]   Jamie Reeves is a pleasant 52 y.o. female presenting with the following:  Chief Complaint  Patient presents with   Flank Pain    3 days left    Vitals:   09/29/22 1422  BP: 128/78  Pulse: (!) 110  SpO2: 98%   Vitals:   09/29/22 1422  Weight: 288 lb (130.6 kg)  Height: 5\' 9"  (1.753 m)   Body mass index is 42.53 kg/m.     Independent interpretation of notes and tests performed by another provider:   KUB with stool burden, phleboliths, no acute process, no stones evident  Procedures performed:   None  Pertinent History, Exam, Impression, and Recommendations:   Jamie Reeves was seen today for flank pain.  Left flank pain Assessment & Plan: Acute x 3 days, denies dysuria, fevers, chills, not colicky, no discharge, decreased BM (baseline). Exam nonfocal. X-ray with stool burden, no stones, UA reassuring.  Ddx: UTI, pyelonephritis, renal stone, constipation, functional, MSK pain  Plan: - UA send to Ucx - Bowel regimen - Trulance - PRN Zanaflex  Orders: -     POCT urinalysis dipstick -     tiZANidine HCl; Take 1 tablet (4 mg total) by mouth every 8 (eight) hours as needed for muscle spasms.  Dispense: 30 tablet; Refill: 1 -     Trulance; Take 1 tablet (3 mg total) by mouth daily.  Dispense: 90 tablet; Refill: 0 -     Urine Culture     Orders & Medications Meds ordered this encounter  Medications   tiZANidine (ZANAFLEX) 4 MG tablet    Sig: Take 1 tablet (4 mg total) by mouth every 8 (eight) hours as needed for muscle spasms.    Dispense:  30 tablet    Refill:  1   Plecanatide (TRULANCE) 3 MG TABS    Sig: Take 1 tablet (3 mg total) by mouth daily.    Dispense:  90 tablet    Refill:  0   Orders Placed This Encounter  Procedures   Urine Culture   POCT urinalysis dipstick     No follow-ups on file.      Jerrol Banana, MD, [facility]   Primary Care Sports Medicine Primary Care and Sports Medicine at [facility]

## 2022-10-04 NOTE — Assessment & Plan Note (Addendum)
Acute x 3 days, denies dysuria, fevers, chills, not colicky, no discharge, decreased BM (baseline). Exam nonfocal. X-ray with stool burden, no stones, UA reassuring.  Ddx: UTI, pyelonephritis, renal stone, constipation, functional, MSK pain  Plan: - UA send to Ucx - Bowel regimen - Trulance - PRN Zanaflex

## 2022-10-05 LAB — CULTURE, URINE COMPREHENSIVE

## 2022-10-09 DIAGNOSIS — F330 Major depressive disorder, recurrent, mild: Secondary | ICD-10-CM | POA: Diagnosis not present

## 2022-10-09 DIAGNOSIS — F411 Generalized anxiety disorder: Secondary | ICD-10-CM | POA: Diagnosis not present

## 2022-10-20 ENCOUNTER — Other Ambulatory Visit: Payer: Self-pay

## 2022-10-20 ENCOUNTER — Encounter: Payer: Self-pay | Admitting: Family Medicine

## 2022-10-20 NOTE — Telephone Encounter (Signed)
Please advise 

## 2022-10-24 NOTE — Telephone Encounter (Signed)
Please advise 

## 2022-11-02 ENCOUNTER — Encounter: Payer: Self-pay | Admitting: Family Medicine

## 2022-11-02 NOTE — Telephone Encounter (Signed)
Please review.  KP

## 2022-11-02 NOTE — Telephone Encounter (Signed)
Please advise 

## 2022-11-09 ENCOUNTER — Encounter: Payer: Self-pay | Admitting: Family Medicine

## 2022-11-09 ENCOUNTER — Other Ambulatory Visit: Payer: Self-pay

## 2022-11-09 DIAGNOSIS — M47817 Spondylosis without myelopathy or radiculopathy, lumbosacral region: Secondary | ICD-10-CM

## 2022-11-09 MED ORDER — GABAPENTIN 300 MG PO CAPS
ORAL_CAPSULE | ORAL | 3 refills | Status: DC
Start: 2022-11-09 — End: 2023-03-13

## 2022-11-09 NOTE — Telephone Encounter (Signed)
RX sent

## 2022-11-09 NOTE — Telephone Encounter (Signed)
fyi

## 2022-11-09 NOTE — Telephone Encounter (Signed)
Please advise 

## 2022-11-15 DIAGNOSIS — F330 Major depressive disorder, recurrent, mild: Secondary | ICD-10-CM | POA: Diagnosis not present

## 2022-11-15 DIAGNOSIS — F411 Generalized anxiety disorder: Secondary | ICD-10-CM | POA: Diagnosis not present

## 2022-11-17 ENCOUNTER — Encounter: Payer: Self-pay | Admitting: Family Medicine

## 2022-11-17 ENCOUNTER — Encounter: Payer: Self-pay | Admitting: Pain Medicine

## 2022-11-17 ENCOUNTER — Encounter: Payer: Self-pay | Admitting: Gastroenterology

## 2022-11-17 NOTE — Telephone Encounter (Signed)
I called and spoke with patient after Dr. Laban Emperor checked for an addendum to sign off on. There was nothing for him to sign off on. Patient called and informed and also told her to reach out again to medical records and to follow up with [place] prn.

## 2022-11-28 ENCOUNTER — Ambulatory Visit: Payer: 59 | Admitting: Urology

## 2022-11-28 VITALS — BP 134/77 | HR 91 | Ht 69.0 in | Wt 290.1 lb

## 2022-11-28 DIAGNOSIS — N3946 Mixed incontinence: Secondary | ICD-10-CM | POA: Diagnosis not present

## 2022-11-28 LAB — URINALYSIS, COMPLETE
Bilirubin, UA: NEGATIVE
Glucose, UA: NEGATIVE
Ketones, UA: NEGATIVE
Leukocytes,UA: NEGATIVE
Nitrite, UA: NEGATIVE
Protein,UA: NEGATIVE
RBC, UA: NEGATIVE
Specific Gravity, UA: >1.03 — ABNORMAL HIGH (ref 1.005–1.030)
Urobilinogen, Ur: 1 mg/dL (ref 0.2–1.0)
pH, UA: 6 (ref 5.0–7.5)

## 2022-11-28 LAB — MICROSCOPIC EXAMINATION: Epithelial Cells (non renal): >10 /hpf — AB (ref 0–10)

## 2022-11-28 MED ORDER — OXYBUTYNIN CHLORIDE ER 10 MG PO TB24
10.0000 mg | ORAL_TABLET | Freq: Every day | ORAL | 11 refills | Status: DC
Start: 2022-11-28 — End: 2022-12-08
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
## 2022-12-01 ENCOUNTER — Encounter: Payer: Self-pay | Admitting: Family Medicine

## 2022-12-01 NOTE — Telephone Encounter (Signed)
Please advise 

## 2022-12-02 ENCOUNTER — Other Ambulatory Visit: Payer: Self-pay

## 2022-12-02 DIAGNOSIS — R109 Unspecified abdominal pain: Secondary | ICD-10-CM

## 2022-12-02 DIAGNOSIS — M7989 Other specified soft tissue disorders: Secondary | ICD-10-CM

## 2022-12-02 MED ORDER — TIZANIDINE HCL 4 MG PO TABS
4.0000 mg | ORAL_TABLET | Freq: Three times a day (TID) | ORAL | 1 refills | Status: AC | PRN
Start: 2022-12-02 — End: ?

## 2022-12-07 ENCOUNTER — Other Ambulatory Visit: Payer: Self-pay | Admitting: Urology

## 2022-12-08 ENCOUNTER — Ambulatory Visit: Payer: 59 | Admitting: Family Medicine

## 2022-12-08 ENCOUNTER — Encounter: Payer: Self-pay | Admitting: Family Medicine

## 2022-12-08 VITALS — BP 138/84 | HR 100 | Ht 69.0 in | Wt 288.0 lb

## 2022-12-08 DIAGNOSIS — G4701 Insomnia due to medical condition: Secondary | ICD-10-CM

## 2022-12-08 DIAGNOSIS — F418 Other specified anxiety disorders: Secondary | ICD-10-CM | POA: Diagnosis not present

## 2022-12-08 MED ORDER — CITALOPRAM HYDROBROMIDE 40 MG PO TABS: 40.0000 mg | ORAL_TABLET | Freq: Every day | ORAL | 3 refills | Status: DC

## 2022-12-08 MED ORDER — AMITRIPTYLINE HCL 25 MG PO TABS: 25.0000 mg | ORAL_TABLET | Freq: Every day | ORAL | 0 refills | Status: DC

## 2022-12-09 NOTE — Progress Notes (Signed)
     Primary Care / Sports Medicine Office Visit  Patient Information:  Patient ID: Jamie Reeves, female DOB: 04/30/1971 Age: 52 y.o. MRN: [id number]   Jamie Reeves is a pleasant 52 y.o. female presenting with the following:  Chief Complaint  Patient presents with   Anxiety    PSY isn't in network anymore, feels more anxiety doesn't think the Celexa is working at all, is more emotional.     Vitals:   12/08/22 1511  BP: 138/84  Pulse: 100  SpO2: 99%   Vitals:   12/08/22 1511  Weight: 288 lb (130.6 kg)  Height: 5\' 9"  (1.753 m)   Body mass index is 42.53 kg/m.  No results found.   Independent interpretation of notes and tests performed by another provider:   None  Procedures performed:   None  Pertinent History, Exam, Impression, and Recommendations:   Jamie Reeves was seen today for anxiety.  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 She had been seeing psychiatry but due to insurance changes has to look for another. We reviewed treatment options and plan as follows:  - Trial nightly amitriptyline 25 mg, can increase to 50 mg (2 tablets) after 1 week if needed - Work on "sleep hygiene" with information attached  Orders: -     Amitriptyline HCl; Take 1 tablet (25 mg total) by mouth at bedtime.  Dispense: 90 tablet; Refill: 0  Depression with anxiety Assessment & Plan: Had established with psychiatrist who discontinued Cymbalta and started Celexa 20 mg. Has noted some mild worsening anxiety and worsened pain control. We discussed next steps, plan as follows:  - Titrate Celexa to 40 mg daily - Seek alternate psychiatry group to establish care, if needed referral, can contact our office - Touch base with [place] OR your new psychiatry group in 4-6 weeks for next steps  Orders: -     Citalopram Hydrobromide; Take 1 tablet (40 mg total) by mouth daily.  Dispense: 30 tablet; Refill:  3     Orders & Medications Meds ordered this encounter  Medications   amitriptyline (ELAVIL) 25 MG tablet    Sig: Take 1 tablet (25 mg total) by mouth at bedtime.    Dispense:  90 tablet    Refill:  0   citalopram (CELEXA) 40 MG tablet    Sig: Take 1 tablet (40 mg total) by mouth daily.    Dispense:  30 tablet    Refill:  3   No orders of the defined types were placed in this encounter.    No follow-ups on file.     Jerrol Banana, MD, [facility]   Primary Care Sports Medicine Primary Care and Sports Medicine at [facility]

## 2022-12-09 NOTE — Assessment & Plan Note (Signed)
Chronic, in setting of comorbid anxiety / depression and OSA on CPAP. She had been seeing psychiatry but due to insurance changes has to look for another. We reviewed treatment options and plan as follows:  - Trial nightly amitriptyline 25 mg, can increase to 50 mg (2 tablets) after 1 week if needed - Work on "sleep hygiene" with information attached

## 2022-12-09 NOTE — Patient Instructions (Addendum)
For mood - Start new Celexa 40 mg daily - Seek new psychiatry group to establish care, if needing a referral, contact our office - Touch base with [place] OR your new psychiatry group in 4-6 weeks for next steps  For sleep - Trial nightly amitriptyline 25 mg, can increase to 50 mg (2 tablets) after 1 week if needed - Work on "sleep hygiene" with information attached

## 2022-12-09 NOTE — Assessment & Plan Note (Signed)
Had established with psychiatrist who discontinued Cymbalta and started Celexa 20 mg. Has noted some mild worsening anxiety and worsened pain control. We discussed next steps, plan as follows:  - Titrate Celexa to 40 mg daily - Seek alternate psychiatry group to establish care, if needed referral, can contact our office - Touch base with [place] OR your new psychiatry group in 4-6 weeks for next steps

## 2022-12-12 ENCOUNTER — Other Ambulatory Visit: Payer: Self-pay | Admitting: Family Medicine

## 2022-12-12 DIAGNOSIS — F418 Other specified anxiety disorders: Secondary | ICD-10-CM

## 2022-12-13 NOTE — Telephone Encounter (Signed)
Requested medications are due for refill today.  yes  Requested medications are on the active medications list.  yes  Last refill. 09/22/2022 #60 2 rf  Future visit scheduled.   yes  Notes to clinic.  Medication not assigned to a protocol . Please review for refill.    Requested Prescriptions  Pending Prescriptions Disp Refills   AUVELITY 45-105 MG TBCR [Pharmacy Med Name: AUVELITY 45-105MG  TABLET ER] 60 tablet 2    Sig: TAKE ONE (1) TABLET BY MOUTH IN THE MORNING AND AT BEDTIME.     Off-Protocol Failed - 12/12/2022  3:21 PM      Failed - Medication not assigned to a protocol, review manually.      Passed - Valid encounter within last 12 months    Recent Outpatient Visits           5 days ago Insomnia due to medical condition   [place] [facility] at [facility], Ocie Bob, MD   2 months ago Left flank pain   [place] [facility] at [facility], Ocie Bob, MD   3 months ago Heat intolerance   [place] [facility] at [facility], Ocie Bob, MD   5 months ago Rhinosinusitis   [facility] Care & Sports Medicine at [facility], Ocie Bob, MD   7 months ago Encounter for weight management   [facility] Primary Care & Sports Medicine at [facility], Ocie Bob, MD       Future Appointments             In 4 months Ashley Royalty, Ocie Bob, MD [facility] Primary Care & Sports Medicine at [facility], [facility]

## 2022-12-14 ENCOUNTER — Other Ambulatory Visit: Payer: Self-pay

## 2022-12-14 ENCOUNTER — Encounter (HOSPITAL_BASED_OUTPATIENT_CLINIC_OR_DEPARTMENT_OTHER): Payer: Self-pay | Admitting: Urology

## 2022-12-14 NOTE — Progress Notes (Signed)
Spoke w/ via phone for pre-op interview--- Pt Lab needs dos----              Lab results------ COVID test -----patient states asymptomatic no test needed Arrive at ------- 0630 NPO after MN NO Solid Food.  Clear liquids from MN until--- 0530 Med rec completed Medications to take morning of surgery -----  Diabetic medication ----- N/A Patient instructed no nail polish to be worn day of surgery Patient instructed to bring photo id and insurance card day of surgery Patient aware to have Driver (ride ) / caregiver    for 24 hours after surgery: Brandy Hale: [phone]  Patient Special Instructions ----- Bring CPAP, follow instructions from physician on stopping vitamins Pre-Op special Instructions ----- Patient verbalized understanding of instructions that were given at this phone interview. Patient denies shortness of breath, chest pain, fever, cough at this phone interview.

## 2022-12-21 ENCOUNTER — Ambulatory Visit: Payer: 59 | Admitting: Podiatry

## 2022-12-21 ENCOUNTER — Encounter: Payer: Self-pay | Admitting: Podiatry

## 2022-12-21 ENCOUNTER — Encounter: Payer: Self-pay | Admitting: Gastroenterology

## 2022-12-21 ENCOUNTER — Ambulatory Visit (INDEPENDENT_AMBULATORY_CARE_PROVIDER_SITE_OTHER): Payer: 59

## 2022-12-21 ENCOUNTER — Encounter: Payer: Self-pay | Admitting: Family Medicine

## 2022-12-21 DIAGNOSIS — M722 Plantar fascial fibromatosis: Secondary | ICD-10-CM

## 2022-12-21 DIAGNOSIS — M7661 Achilles tendinitis, right leg: Secondary | ICD-10-CM | POA: Diagnosis not present

## 2022-12-21 MED ORDER — MELOXICAM 15 MG PO TABS: 15.0000 mg | ORAL_TABLET | Freq: Every day | ORAL | 3 refills | Status: DC

## 2022-12-21 MED ORDER — METHYLPREDNISOLONE 4 MG PO TBPK: ORAL_TABLET | ORAL | 0 refills | Status: DC

## 2022-12-21 MED ORDER — TRIAMCINOLONE ACETONIDE 40 MG/ML IJ SUSP
20.0000 mg | Freq: Once | INTRAMUSCULAR | Status: AC
Start: 2022-12-21 — End: 2022-12-21
  Administered 2022-12-21: 20 mg
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
## 2022-12-27 ENCOUNTER — Other Ambulatory Visit: Payer: Self-pay | Admitting: Family Medicine

## 2022-12-27 DIAGNOSIS — Z7689 Persons encountering health services in other specified circumstances: Secondary | ICD-10-CM

## 2022-12-28 NOTE — Telephone Encounter (Signed)
Requested Prescriptions  Pending Prescriptions Disp Refills   topiramate (TOPAMAX) 50 MG tablet [Pharmacy Med Name: TOPIRAMATE 50MG  TABLETS] 180 tablet 1    Sig: TAKE 1 TABLET(50 MG) BY MOUTH TWICE DAILY     Neurology: Anticonvulsants - topiramate & zonisamide Failed - 12/27/2022 12:25 PM      Failed - ALT in normal range and within 360 days    ALT  Date Value Ref Range Status  04/18/2022 37 (H) 0 - 32 IU/L Final         Passed - Cr in normal range and within 360 days    Creatinine, Ser  Date Value Ref Range Status  04/18/2022 0.83 0.57 - 1.00 mg/dL Final         Passed - CO2 in normal range and within 360 days    CO2  Date Value Ref Range Status  04/18/2022 22 20 - 29 mmol/L Final         Passed - AST in normal range and within 360 days    AST  Date Value Ref Range Status  04/18/2022 35 0 - 40 IU/L Final         Passed - Completed PHQ-2 or PHQ-9 in the last 360 days      Passed - Valid encounter within last 12 months    Recent Outpatient Visits           2 weeks ago Insomnia due to medical condition   [facility] Ashley Royalty, Ocie Bob, MD   3 months ago Left flank pain   [place] [facility], Ocie Bob, MD   4 months ago Heat intolerance   [place] [facility], Ocie Bob, MD   6 months ago Rhinosinusitis   [facility], Ocie Bob, MD   8 months ago Encounter for weight management   [facility] at [facility], Ocie Bob, MD       Future Appointments             In 4 months Ashley Royalty, Ocie Bob, MD [facility] at [facility]

## 2023-01-01 NOTE — H&P (Signed)
CC/HPI: cc: Urinary incontinence   12/06/2022: 52 year old woman referred by Dr. Sherron Monday for stress urinary continence. She was previously seen by Dr. Sherron Monday at [facility]. Urodynamic study shows stress urinary incontinence but no instability. Patient feels like she has both urge and stress component. She has tried Myrbetriq without significant improvement in symptoms. She is going to start oxybutynin soon that was prescribed to her. She has a history of 3 vaginal deliveries. She is also looking into bariatric surgery. 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 She had a cystoscopy that was reportedly normal. She wears incontinence underwear.    UDS SUMMARY  [name] held a max capacity of approx. 360 mls. Her 1st sensation was felt at 157 mls. There was positive SUI. She leaked with both coughing and Valsalva. No instability was noted. She was able to generate a voluntary contraction and void 301 mls with max flow of 28 ml/s. Intermittent increased EMG activity was noted during voiding. PVR was approx. 57 mls. No trabeculation was noted. No reflux was seen. She will keep follow up appointment in [facility].     ALLERGIES: No Allergies    MEDICATIONS: Citalopram Hbr 20 mg tablet 1 tablet PO Daily  Citalopram Hbr 20 mg tablet 1 tablet PO Daily  Citalopram Hbr 20 mg tablet 1 tablet PO Daily  Dexamethasone 1 mg tablet 1 tablet PO Daily  Duloxetine Hcl 60 mg capsule,delayed release 1 capsule PO Daily  Duloxetine Hcl 30 mg capsule,delayed release 1 capsule PO Daily  Fluconazole 100 mg tablet 1 tablet PO Daily  Gabapentin 300 mg capsule 1 capsule PO Daily  Gabapentin 300 mg capsule 1 capsule PO Daily  Phentermine Hcl 37.5 mg tablet 1 tablet PO Daily  Phentermine Hcl 15 mg capsule 1 capsule PO Daily  Ropinirole Hcl 0.25 mg tablet 1 tablet PO Daily  Ropinirole Hcl 0.25 mg tablet 1 tablet PO Daily  Tizanidine  Hcl 4 mg tablet 1 tablet PO Daily  Tizanidine Hcl 4 mg tablet 1 tablet PO Daily  Tizanidine Hcl 4 mg tablet 1 tablet PO Daily  Topiramate 50 mg tablet 1 tablet PO Daily  Topiramate 25 mg tablet 1 tablet PO Daily  Trulance 3 mg tablet 1 tablet PO Daily     GU PSH: Complex cystometrogram, w/ void pressure and urethral pressure profile studies, any technique - 07/13/2022 Complex Uroflow - 07/13/2022 Emg surf Electrd - 07/13/2022 Inject For cystogram - 07/13/2022 Intrabd voidng Press - 07/13/2022     NON-GU PSH: No Non-GU PSH    GU PMH: Mixed incontinence - 07/13/2022    NON-GU PMH: No Non-GU PMH    FAMILY HISTORY: No Family History    SOCIAL HISTORY: No Social History    REVIEW OF SYSTEMS:    GU Review Female:   Patient denies frequent urination, hard to postpone urination, burning /pain with urination, get up at night to urinate, leakage of urine, stream starts and stops, trouble starting your stream, have to strain to urinate, and being pregnant.  Gastrointestinal (Upper):   Patient denies nausea, vomiting, and indigestion/ heartburn.  Gastrointestinal (Lower):   Patient denies diarrhea and constipation.  Constitutional:   Patient denies fever, night sweats, weight loss, and fatigue.  Skin:   Patient denies skin rash/ lesion and itching.  Eyes:   Patient denies blurred vision and double vision.  Ears/ Nose/ Throat:   Patient denies sore throat and sinus problems.  Hematologic/Lymphatic:   Patient denies swollen glands and easy bruising.  Cardiovascular:   Patient denies leg swelling and chest pains.  Respiratory:   Patient denies cough and shortness of breath.  Endocrine:   Patient denies excessive thirst.  Musculoskeletal:   Patient denies back pain and joint pain.  Neurological:   Patient denies headaches and dizziness.  Psychologic:   Patient denies depression and anxiety.   Notes: Pt stated she was recommended by [employer] for a porcedue, same issues.    VITAL SIGNS: None    MULTI-SYSTEM PHYSICAL EXAMINATION:    Constitutional: Well-nourished. No physical deformities. Normally developed. Good grooming.  Neck: Neck symmetrical, not swollen. Normal tracheal position.  Respiratory: No labored breathing, no use of accessory muscles.   Skin: No paleness, no jaundice, no cyanosis. No lesion, no ulcer, no rash.  Neurologic / Psychiatric: Oriented to time, oriented to place, oriented to person. No depression, no anxiety, no agitation.  Eyes: Normal conjunctivae. Normal eyelids.  Ears, Nose, Mouth, and Throat: Left ear no scars, no lesions, no masses. Right ear no scars, no lesions, no masses. Nose no scars, no lesions, no masses. Normal hearing. Normal lips.  Musculoskeletal: Normal gait and station of head and neck.     Complexity of Data:  Records Review:   Previous Patient Records, POC Tool  Urine Test Review:   Urinalysis  Urodynamics Review:   Review Urodynamics Tests  Notes:                     10/03/2022: BUN 14, creatinine 0.61, GFR 107   PROCEDURES:          Urinalysis w/Scope Dipstick Dipstick Cont'd Micro  Color: Yellow Bilirubin: Neg mg/dL WBC/hpf: NS (Not Seen)  Appearance: Slightly Cloudy Ketones: Trace mg/dL RBC/hpf: NS (Not Seen)  Specific Gravity: 1.030 Blood: Neg ery/uL Bacteria: Few (10-25/hpf)  pH: 6.0 Protein: Trace mg/dL Cystals: NS (Not Seen)  Glucose: Neg mg/dL Urobilinogen: 1.0 mg/dL Casts: NS (Not Seen)    Nitrites: Neg Trichomonas: Not Present    Leukocyte Esterase: Neg leu/uL Mucous: Present      Epithelial Cells: 0 - 5/hpf      Yeast: Few (1 - 5/hpf)      Sperm: Not Present    Notes: **UNSPUN MICRO**    ASSESSMENT:      ICD-10 Details  1 GU:   Stress Incontinence - N39.3 Chronic, Stable  2   Urge incontinence - N39.41 Chronic, Stable   PLAN:           Document Letter(s):  Created for Patient: Clinical Summary         Notes:   1. Stress urinary incontinence:  -I reviewed options for patient including pelvic floor  physical therapy, Bulkamid and mid urethral sling  -Patient does not think she will have enough time for physical therapy with her job. We decided to move forward with Bulkamid and if unsuccessful consider mid urethral sling. She is looking into bariatric surgery which will help with sling placement if she is able to lose weight.  -Risks and benefits of Bulkamid were discussed with the patient in detail including but not limited to pain, bleeding, infection, urinary retention, need for additional treatment, failure to improve symptoms.   2. Urge incontinence:  -Patient has failed Myrbetriq. She is going to try oxybutynin which was given to her by Dr. Sherron Monday   schedule n/a bulkamid

## 2023-01-02 NOTE — Anesthesia Preprocedure Evaluation (Signed)
Anesthesia Evaluation  Patient identified by MRN, date of birth, ID band Patient awake    Reviewed: Allergy & Precautions, NPO status , Patient's Chart, lab work & pertinent test results  History of Anesthesia Complications Negative for: history of anesthetic complications  Airway Mallampati: III  TM Distance: >3 FB Neck ROM: Full    Dental  (+) Dental Advisory Given, Teeth Intact   Pulmonary sleep apnea and Continuous Positive Airway Pressure Ventilation , former smoker   Pulmonary exam normal        Cardiovascular negative cardio ROS Normal cardiovascular exam     Neuro/Psych  PSYCHIATRIC DISORDERS Anxiety Depression     Neuromuscular disease    GI/Hepatic Neg liver ROS,GERD  Medicated and Controlled,,  Endo/Other    Morbid obesity  Renal/GU negative Renal ROS     Musculoskeletal  (+) Arthritis ,    Abdominal  (+) + obese  Peds  Hematology negative hematology ROS (+)   Anesthesia Other Findings Chronic pain    Reproductive/Obstetrics                             Anesthesia Physical Anesthesia Plan  ASA: 3  Anesthesia Plan: MAC   Post-op Pain Management: Tylenol PO (pre-op)* and Minimal or no pain anticipated   Induction:   PONV Risk Score and Plan: 2 and Propofol infusion and Treatment may vary due to age or medical condition  Airway Management Planned: Natural Airway and Simple Face Mask  Additional Equipment: None  Intra-op Plan:   Post-operative Plan:   Informed Consent: I have reviewed the patients History and Physical, chart, labs and discussed the procedure including the risks, benefits and alternatives for the proposed anesthesia with the patient or authorized representative who has indicated his/her understanding and acceptance.       Plan Discussed with: CRNA and Anesthesiologist  Anesthesia Plan Comments:        Anesthesia Quick Evaluation

## 2023-01-03 ENCOUNTER — Ambulatory Visit (HOSPITAL_BASED_OUTPATIENT_CLINIC_OR_DEPARTMENT_OTHER): Payer: 59 | Admitting: Anesthesiology

## 2023-01-03 ENCOUNTER — Ambulatory Visit (HOSPITAL_BASED_OUTPATIENT_CLINIC_OR_DEPARTMENT_OTHER)
Admission: RE | Admit: 2023-01-03 | Discharge: 2023-01-03 | Disposition: A | Discharge status: Home / Self Care | Payer: 59 | Attending: Urology | Admitting: Urology

## 2023-01-03 ENCOUNTER — Other Ambulatory Visit: Payer: Self-pay

## 2023-01-03 ENCOUNTER — Encounter (HOSPITAL_BASED_OUTPATIENT_CLINIC_OR_DEPARTMENT_OTHER)
Admission: RE | Disposition: A | Discharge status: Home / Self Care | Payer: Self-pay | Source: Home / Self Care | Attending: Urology

## 2023-01-03 ENCOUNTER — Encounter (HOSPITAL_BASED_OUTPATIENT_CLINIC_OR_DEPARTMENT_OTHER): Payer: Self-pay | Admitting: Urology

## 2023-01-03 DIAGNOSIS — E782 Mixed hyperlipidemia: Secondary | ICD-10-CM

## 2023-01-03 DIAGNOSIS — Z6841 Body Mass Index (BMI) 40.0 and over, adult: Secondary | ICD-10-CM | POA: Diagnosis not present

## 2023-01-03 DIAGNOSIS — Z01818 Encounter for other preprocedural examination: Secondary | ICD-10-CM

## 2023-01-03 DIAGNOSIS — N3946 Mixed incontinence: Secondary | ICD-10-CM | POA: Diagnosis present

## 2023-01-03 DIAGNOSIS — N393 Stress incontinence (female) (male): Secondary | ICD-10-CM

## 2023-01-03 HISTORY — PX: CYSTOSCOPY WITH INJECTION: SHX1424

## 2023-01-03 HISTORY — DX: Sleep apnea, unspecified: G47.30

## 2023-01-03 SURGERY — CYSTOSCOPY, WITH INJECTION OF BLADDER NECK OR BLADDER WALL
Anesthesia: Monitor Anesthesia Care

## 2023-01-03 MED ORDER — CEFAZOLIN SODIUM-DEXTROSE 2-4 GM/100ML-% IV SOLN
INTRAVENOUS | Status: AC
  Filled 2023-01-03: qty 100

## 2023-01-03 MED ORDER — FENTANYL CITRATE (PF) 100 MCG/2ML IJ SOLN
25.0000 ug | INTRAMUSCULAR | Status: DC | PRN
  Administered 2023-01-03: 50 ug via INTRAVENOUS

## 2023-01-03 MED ORDER — MIDAZOLAM HCL 2 MG/2ML IJ SOLN
INTRAMUSCULAR | Status: AC
  Filled 2023-01-03: qty 2

## 2023-01-03 MED ORDER — FENTANYL CITRATE (PF) 100 MCG/2ML IJ SOLN
INTRAMUSCULAR | Status: DC | PRN
  Administered 2023-01-03: 12.5 ug via INTRAVENOUS
  Administered 2023-01-03: 25 ug via INTRAVENOUS
  Administered 2023-01-03: 12.5 ug via INTRAVENOUS

## 2023-01-03 MED ORDER — ONDANSETRON HCL 4 MG/2ML IJ SOLN
INTRAMUSCULAR | Status: DC | PRN
  Administered 2023-01-03: 4 mg via INTRAVENOUS

## 2023-01-03 MED ORDER — DEXAMETHASONE SODIUM PHOSPHATE 4 MG/ML IJ SOLN
INTRAMUSCULAR | Status: DC | PRN
  Administered 2023-01-03: 5 mg via INTRAVENOUS

## 2023-01-03 MED ORDER — LIDOCAINE HCL (CARDIAC) PF 100 MG/5ML IV SOSY
PREFILLED_SYRINGE | INTRAVENOUS | Status: DC | PRN
  Administered 2023-01-03: 60 mg via INTRAVENOUS

## 2023-01-03 MED ORDER — CEFAZOLIN SODIUM-DEXTROSE 2-4 GM/100ML-% IV SOLN
2.0000 g | INTRAVENOUS | Status: AC
  Administered 2023-01-03: 2 g via INTRAVENOUS

## 2023-01-03 MED ORDER — FENTANYL CITRATE (PF) 100 MCG/2ML IJ SOLN
INTRAMUSCULAR | Status: AC
  Filled 2023-01-03: qty 2

## 2023-01-03 MED ORDER — MIDAZOLAM HCL 5 MG/5ML IJ SOLN
INTRAMUSCULAR | Status: DC | PRN
  Administered 2023-01-03: 2 mg via INTRAVENOUS

## 2023-01-03 MED ORDER — ONDANSETRON HCL 4 MG/2ML IJ SOLN
INTRAMUSCULAR | Status: AC
  Filled 2023-01-03: qty 2

## 2023-01-03 MED ORDER — PROMETHAZINE HCL 25 MG/ML IJ SOLN: 6.2500 mg | INTRAMUSCULAR | Status: DC | PRN

## 2023-01-03 MED ORDER — LIDOCAINE HCL (PF) 2 % IJ SOLN
INTRAMUSCULAR | Status: AC
  Filled 2023-01-03: qty 5

## 2023-01-03 MED ORDER — PROPOFOL 500 MG/50ML IV EMUL
INTRAVENOUS | Status: AC
  Filled 2023-01-03: qty 50

## 2023-01-03 MED ORDER — PROPOFOL 10 MG/ML IV BOLUS
INTRAVENOUS | Status: DC | PRN
Start: 2023-01-03 — End: 2023-01-03
  Administered 2023-01-03: 20 mg via INTRAVENOUS

## 2023-01-03 MED ORDER — DEXAMETHASONE SODIUM PHOSPHATE 10 MG/ML IJ SOLN
INTRAMUSCULAR | Status: AC
  Filled 2023-01-03: qty 1

## 2023-01-03 MED ORDER — ACETAMINOPHEN 500 MG PO TABS
ORAL_TABLET | ORAL | Status: AC
  Filled 2023-01-03: qty 2

## 2023-01-03 MED ORDER — OXYCODONE HCL 5 MG PO TABS: 5.0000 mg | ORAL_TABLET | Freq: Once | ORAL | Status: DC | PRN

## 2023-01-03 MED ORDER — OXYCODONE HCL 5 MG/5ML PO SOLN: 5.0000 mg | Freq: Once | ORAL | Status: DC | PRN

## 2023-01-03 MED ORDER — WATER FOR IRRIGATION, STERILE IR SOLN
Status: DC | PRN
  Administered 2023-01-03: 3000 mL via SURGICAL_CAVITY

## 2023-01-03 MED ORDER — LACTATED RINGERS IV SOLN: INTRAVENOUS | Status: DC

## 2023-01-03 MED ORDER — ACETAMINOPHEN 500 MG PO TABS
1000.0000 mg | ORAL_TABLET | Freq: Once | ORAL | Status: AC
  Administered 2023-01-03: 1000 mg via ORAL

## 2023-01-03 MED ORDER — PROPOFOL 10 MG/ML IV BOLUS
INTRAVENOUS | Status: AC
  Filled 2023-01-03: qty 20

## 2023-01-03 MED ORDER — PROPOFOL 500 MG/50ML IV EMUL
INTRAVENOUS | Status: DC | PRN
  Administered 2023-01-03: 100 ug/kg/min via INTRAVENOUS

## 2023-01-03 SURGICAL SUPPLY — 20 items
BAG DRAIN URO-CYSTO SKYTR STRL (DRAIN) ×1 IMPLANT
BAG DRN UROCATH (DRAIN) ×1
CLOTH BEACON ORANGE TIMEOUT ST (SAFETY) ×1 IMPLANT
ELECT REM PT RETURN 9FT ADLT (ELECTROSURGICAL) ×1
ELECTRODE REM PT RTRN 9FT ADLT (ELECTROSURGICAL) ×1 IMPLANT
GLOVE BIO SURGEON STRL SZ 6.5 (GLOVE) ×1 IMPLANT
GOWN STRL REUS W/TWL LRG LVL3 (GOWN DISPOSABLE) ×1 IMPLANT
KIT TURNOVER CYSTO (KITS) ×1 IMPLANT
MANIFOLD NEPTUNE II (INSTRUMENTS) ×1 IMPLANT
NDL ASPIRATION 22 (NEEDLE) IMPLANT
NDL SAFETY ECLIP 18X1.5 (MISCELLANEOUS) ×1 IMPLANT
NEEDLE ASPIRATION 22 (NEEDLE)
PACK CYSTO (CUSTOM PROCEDURE TRAY) ×1 IMPLANT
SLEEVE SCD COMPRESS KNEE MED (STOCKING) ×1 IMPLANT
SYR 20ML LL LF (SYRINGE) ×1 IMPLANT
SYR CONTROL 10ML LL (SYRINGE) ×1 IMPLANT
SYSTEM URETHRAL BULK BULKAMID (Female Continence) IMPLANT
TUBE CONNECTING 12X1/4 (SUCTIONS) ×1 IMPLANT
TUBING UROLOGY SET (TUBING) ×1 IMPLANT
WATER STERILE IRR 3000ML UROMA (IV SOLUTION) ×1 IMPLANT

## 2023-01-03 NOTE — Discharge Instructions (Addendum)
Cystoscopy with Bulkamid patient instructions  Following a cystoscopy, a catheter (a flexible rubber tube) is sometimes left in place to empty the bladder. This may cause some discomfort or a feeling that you need to urinate. Your doctor determines the period of time that the catheter will be left in place. You may have bloody urine for two to three days (Call your doctor if the amount of bleeding increases or does not subside).  You may pass blood clots in your urine, especially if you had a biopsy. It is not unusual to pass small blood clots and have some bloody urine a couple of weeks after your cystoscopy. Again, call your doctor if the bleeding does not subside. You may have: Dysuria (painful urination) Frequency (urinating often) Urgency (strong desire to urinate)  These symptoms are common especially if medicine is instilled into the bladder or a ureteral stent is placed. Avoiding alcohol and caffeine, such as coffee, tea, and chocolate, may help relieve these symptoms. Drink plenty of water, unless otherwise instructed. Your doctor may also prescribe an antibiotic or other medicine to reduce these symptoms.  Cystoscopy results are available soon after the procedure; biopsy results usually take two to four days. Your doctor will discuss the results of your exam with you. Before you go home, you will be given specific instructions for follow-up care. Special Instructions:   If you are going home with a catheter in place do not take a tub bath until removed by your doctor.   You may resume your normal activities.   Do not drive or operate machinery if you are taking narcotic pain medicine.   Be sure to keep all follow-up appointments with your doctor.   Call Your Doctor If: The catheter is not draining You have severe pain You are unable to urinate You have a fever over 101 You have severe bleeding          Post Anesthesia Home Care Instructions  Activity: Get plenty of rest for  the remainder of the day. A responsible adult should stay with you for 24 hours following the procedure.  For the next 24 hours, DO NOT: -Drive a car -Advertising copywriter -Drink alcoholic beverages -Take any medication unless instructed by your physician -Make any legal decisions or sign important papers.  Meals: Start with liquid foods such as gelatin or soup. Progress to regular foods as tolerated. Avoid greasy, spicy, heavy foods. If nausea and/or vomiting occur, drink only clear liquids until the nausea and/or vomiting subsides. Call your physician if vomiting continues.  Special Instructions/Symptoms: Your throat may feel dry or sore from the anesthesia or the breathing tube placed in your throat during surgery. If this causes discomfort, gargle with warm salt water. The discomfort should disappear within 24 hours.

## 2023-01-03 NOTE — Op Note (Addendum)
Operative Note   Preoperative diagnosis:  1.  Stress urinary incontinence   Postoperative diagnosis: 1.  Stress urinary incontinence   Procedure(s): 1.  Cystoscopy with injection of bulkamid   Surgeon: Kasandra Knudsen, MD   Assistants:  None   Anesthesia:  MAC   Complications:  None   EBL:  minimal   Specimens: 1. none   Drains/Catheters: 1.  none   Intraoperative findings:   Short urethra Grade 2-3 anterior wall prolapse Bilateral orthotopic Uos Normal bladder mucosa   Indication: 52 yo woman with symptomatic stress urinary incontinence.   Description of procedure:   After risks and benefits of the procedure discussed with the patient, informed consent was obtained.  The patient was taken to the operating placed in the supine position.  Anesthesia was induced and antibiotics were administered.  The patient was then repositioned in the dorsolithotomy position.  She was prepped and draped in usual sterile fashion a timeout performed with the attending present.  The cystoscope was assembled with the Bulkamid system.  It was then placed in the urethral meatus and advanced into the bladder under direct visualization.  Prior cystoscopy had been done which noted normal anatomic landmarks.  These were again verified during cystoscopy today.  The cystoscope was brought back to the bladder neck and the needle was advanced through the needle guide at the 1 o'clock position.  Once it was visualized and advanced it was rotated to the 5 o'clock position.  Bulkamid was then injected until a bleb/cushion was seen.  This was then repeated at the 1 o'clock position in the 7 o'clock position until coaptation was noted. In order to achieve coaptation, 2 bulkamid kits were used.   This concluded the case.  The patient's bladder was left with approximately 200 cc of sterile saline.  The patient emerged from anesthesia and was transferred the PACU in stable condition.   Plan:  Plan for  patient to void in PACU prior to discharge.

## 2023-01-03 NOTE — Anesthesia Postprocedure Evaluation (Signed)
Anesthesia Post Note  Patient: Anathea Pinera  Procedure(s) Performed: CYSTOSCOPY WITH  BULKAMID INJECTION     Patient location during evaluation: PACU Anesthesia Type: MAC Level of consciousness: awake and alert Pain management: pain level controlled Vital Signs Assessment: post-procedure vital signs reviewed and stable Respiratory status: spontaneous breathing, nonlabored ventilation and respiratory function stable Cardiovascular status: stable and blood pressure returned to baseline Anesthetic complications: no   No notable events documented.  Last Vitals:  Vitals:   01/03/23 0920 01/03/23 1001  BP:  (!) 133/92  Pulse: 74 74  Resp: 12 (!) 9  Temp:    SpO2: 97% 98%    Last Pain:  Vitals:   01/03/23 0656  TempSrc: Oral                 Beryle Lathe

## 2023-01-03 NOTE — Anesthesia Procedure Notes (Signed)
Procedure Name: MAC Date/Time: 01/03/2023 8:17 AM  Performed by: Jessica Priest, CRNAPre-anesthesia Checklist: Timeout performed, Patient being monitored, Suction available, Emergency Drugs available and Patient identified Patient Re-evaluated:Patient Re-evaluated prior to induction Oxygen Delivery Method: Circle system utilized and Simple face mask Preoxygenation: Pre-oxygenation with 100% oxygen Induction Type: IV induction Placement Confirmation: breath sounds checked- equal and bilateral, CO2 detector and positive ETCO2

## 2023-01-03 NOTE — Interval H&P Note (Signed)
History and Physical Interval Note:  01/03/2023 7:52 AM  Jamie Reeves  has presented today for surgery, with the diagnosis of STRESS URINARY INCONTINENCE.  The various methods of treatment have been discussed with the patient and family. After consideration of risks, benefits and other options for treatment, the patient has consented to  Procedure(s): CYSTOSCOPY WITH  BULKAMID INJECTION (N/A) as a surgical intervention.  The patient's history has been reviewed, patient examined, no change in status, stable for surgery.  I have reviewed the patient's chart and labs.  Questions were answered to the patient's satisfaction.      D 

## 2023-01-03 NOTE — Transfer of Care (Signed)
Immediate Anesthesia Transfer of Care Note  Patient: Jamie Reeves  Procedure(s) Performed: Procedure(s) (LRB): CYSTOSCOPY WITH  BULKAMID INJECTION (N/A)  Patient Location: PACU  Anesthesia Type: MAC  Level of Consciousness: awake, sedated, patient cooperative and responds to stimulation  Airway & Oxygen Therapy: Patient Spontanous Breathing and Patient on RA  Post-op Assessment: Report given to PACU RN, Post -op Vital signs reviewed and stable and Patient moving all extremities  Post vital signs: Reviewed and stable  Complications: No apparent anesthesia complications

## 2023-01-04 ENCOUNTER — Encounter (HOSPITAL_BASED_OUTPATIENT_CLINIC_OR_DEPARTMENT_OTHER): Payer: Self-pay | Admitting: Urology

## 2023-01-11 ENCOUNTER — Other Ambulatory Visit: Payer: Self-pay | Admitting: Family Medicine

## 2023-01-11 DIAGNOSIS — R109 Unspecified abdominal pain: Secondary | ICD-10-CM

## 2023-01-12 NOTE — Telephone Encounter (Signed)
Requested medication (s) are due for refill today: Yes  Requested medication (s) are on the active medication list: Yes  Last refill:  12/02/22  Future visit scheduled: Yes  Notes to clinic:  Not delegated.    Requested Prescriptions  Pending Prescriptions Disp Refills   tiZANidine (ZANAFLEX) 4 MG tablet [Pharmacy Med Name: TIZANIDINE 4MG  TABLETS] 30 tablet 1    Sig: TAKE 1 TABLET(4 MG) BY MOUTH EVERY 8 HOURS AS NEEDED FOR MUSCLE SPASMS     Not Delegated - Cardiovascular:  Alpha-2 Agonists - tizanidine Failed - 01/11/2023  5:03 PM      Failed - This refill cannot be delegated      Passed - Valid encounter within last 6 months    Recent Outpatient Visits           1 month ago Insomnia due to medical condition   [place] [facility], Ocie Bob, MD   3 months ago Left flank pain   [place] [facility], Ocie Bob, MD   4 months ago Heat intolerance   [place] [facility], Ocie Bob, MD   6 months ago Rhinosinusitis   [facility] at [facility], Ocie Bob, MD   8 months ago Encounter for weight management   [facility] at [facility], Ocie Bob, MD       Future Appointments             In 3 months Ashley Royalty, Ocie Bob, MD [facility] Primary Care & Sports Medicine at [facility], [facility]

## 2023-01-16 ENCOUNTER — Encounter: Payer: Self-pay | Admitting: Gastroenterology

## 2023-01-16 ENCOUNTER — Encounter: Payer: Self-pay | Admitting: Family Medicine

## 2023-01-16 ENCOUNTER — Ambulatory Visit: Payer: 59 | Admitting: Podiatry

## 2023-01-16 DIAGNOSIS — M722 Plantar fascial fibromatosis: Secondary | ICD-10-CM

## 2023-01-16 DIAGNOSIS — M7661 Achilles tendinitis, right leg: Secondary | ICD-10-CM

## 2023-01-16 MED ORDER — TRIAMCINOLONE ACETONIDE 40 MG/ML IJ SUSP
20.0000 mg | Freq: Once | INTRAMUSCULAR | Status: AC
Start: 2023-01-16 — End: 2023-01-16
  Administered 2023-01-16: 20 mg

## 2023-01-17 ENCOUNTER — Ambulatory Visit
Admission: RE | Admit: 2023-01-17 | Discharge: 2023-01-17 | Disposition: A | Discharge status: Home / Self Care | Payer: 59 | Source: Ambulatory Visit | Attending: Specialist | Admitting: Specialist

## 2023-01-17 ENCOUNTER — Ambulatory Visit
Admission: RE | Admit: 2023-01-17 | Discharge: 2023-01-17 | Disposition: A | Discharge status: Home / Self Care | Payer: 59 | Attending: Specialist | Admitting: Specialist

## 2023-01-17 ENCOUNTER — Encounter: Payer: Self-pay | Admitting: Family Medicine

## 2023-01-17 ENCOUNTER — Ambulatory Visit: Payer: 59 | Admitting: Family Medicine

## 2023-01-17 ENCOUNTER — Other Ambulatory Visit: Payer: Self-pay | Admitting: Specialist

## 2023-01-17 VITALS — BP 126/72 | HR 89 | Ht 69.0 in | Wt 289.0 lb

## 2023-01-17 DIAGNOSIS — Z01818 Encounter for other preprocedural examination: Secondary | ICD-10-CM

## 2023-01-17 NOTE — Progress Notes (Signed)
She presents today stating that she is approximately 60% improved at this point.  She states that she feels like it has leveled off at 60% but has not regressed.  She continues her other medications.  Objective: Also status alert and oriented x 3 has pain to palpation medial and typical of that right heel.  Assessment: Plan fasciitis resolving 60% right.  Plan: Reinjected today she will continue all other conservative therapies and appropriate shoe gear and I will follow-up with her in 6 weeks if necessary

## 2023-01-20 DIAGNOSIS — Z01818 Encounter for other preprocedural examination: Secondary | ICD-10-CM | POA: Insufficient documentation

## 2023-01-20 NOTE — Assessment & Plan Note (Addendum)
Jamie Reeves is a 52 y.o. presenting for preoperative evaluation/clearance. The planned procedure is bariatric surgery with the treatment goal of weight loss. Cardiac risk for planned procedure is Intermediate (1 to 5%) - intraperitoneal or intrathoracic surgery, carotid endarterectomy, head and neck surgery, orthopedic surgery, prostate surgery  Signs or symptoms of cardiovascular disease? No New or unstable cardiopulmonary signs or symptoms? No Urinary symptoms or undergoing surgical implantation of foreign material (prosthetic joint, heart valve) or invasive urologic procedure? No  BP 126/72   Pulse 89   Ht 5\' 9"  (1.753 m)   Wt 289 lb (131.1 kg)   SpO2 96%   BMI 42.68 kg/m   Exam:  Physical Exam General: Well Developed, well nourished, and in no acute distress.  Neuro: Alert and oriented x3, extra-ocular muscles intact, sensation grossly intact. Cranial nerves II through XII are intact, motor, sensory, and coordinative functions are grossly intact. Skin: Warm and dry, no rashes noted.  Cardiac: Regular rate and rhythm, no murmurs, rubs, or gallops. Respiratory: Clear to auscultation bilaterally. Not using accessory muscles, speaking in full sentences.  Abdominal: Soft, nontender, nondistended, positive bowel sounds, no masses, no organomegaly.    No problem-specific Assessment & Plan notes found for this encounter.   Based on history and exam will order the following preoperative tests: EKG, CXR, Urinalysis, CBC, CMP  Patient is medically cleared to proceed with planned surgery.

## 2023-01-20 NOTE — Progress Notes (Signed)
     Primary Care / Sports Medicine Office Visit  Patient Information:  Patient ID: Jamie Reeves, female DOB: Apr 28, 1971 Age: 52 y.o. MRN: [id number]   Jamie Reeves is a pleasant 52 y.o. female presenting with the following:  Chief Complaint  Patient presents with   Pre-op Exam    Patient is having bariatric surgery in the future ( not yet scheduled ) with Dr Primus Bravo. Dr Smitty Cords has his own bariatric clinic.    Vitals:   01/17/23 1410  BP: 126/72  Pulse: 89  SpO2: 96%   Vitals:   01/17/23 1410  Weight: 289 lb (131.1 kg)  Height: 5\' 9"  (1.753 m)   Body mass index is 42.68 kg/m.  DG Chest 2 View  Result Date: 01/17/2023 CLINICAL DATA:  Preop for bariatric surgery.  Morbid obesity. EXAM: CHEST - 2 VIEW COMPARISON:  None Available. FINDINGS: The heart size and mediastinal contours are within normal limits. Both lungs are clear. The visualized skeletal structures are unremarkable. IMPRESSION: No active cardiopulmonary disease. Electronically Signed   By: Sherian Rein M.D.   On: 01/17/2023 09:46     Independent interpretation of notes and tests performed by another provider:   None  Procedures performed:   None  Pertinent History, Exam, Impression, and Recommendations:   Jamie Reeves was seen today for pre-op exam.  Pre-op evaluation Assessment & Plan: Jamie Reeves is a 52 y.o. presenting for preoperative evaluation/clearance. The planned procedure is bariatric surgery with the treatment goal of weight loss. Cardiac risk for planned procedure is Intermediate (1 to 5%) - intraperitoneal or intrathoracic surgery, carotid endarterectomy, head and neck surgery, orthopedic surgery, prostate surgery  Signs or symptoms of cardiovascular disease? No New or unstable cardiopulmonary signs or symptoms? No Urinary symptoms or undergoing surgical implantation of foreign material (prosthetic joint, heart valve) or invasive urologic procedure? No  BP 126/72   Pulse 89   Ht 5\' 9"   (1.753 m)   Wt 289 lb (131.1 kg)   SpO2 96%   BMI 42.68 kg/m   Exam:  Physical Exam General: Well Developed, well nourished, and in no acute distress.  Neuro: Alert and oriented x3, extra-ocular muscles intact, sensation grossly intact. Cranial nerves II through XII are intact, motor, sensory, and coordinative functions are grossly intact. Skin: Warm and dry, no rashes noted.  Cardiac: Regular rate and rhythm, no murmurs, rubs, or gallops. Respiratory: Clear to auscultation bilaterally. Not using accessory muscles, speaking in full sentences.  Abdominal: Soft, nontender, nondistended, positive bowel sounds, no masses, no organomegaly.    No problem-specific Assessment & Plan notes found for this encounter.   Based on history and exam will order the following preoperative tests: EKG, CXR, Urinalysis, CBC, CMP  Patient is medically cleared to proceed with planned surgery.   Orders: -     EKG 12-Lead     Orders & Medications No orders of the defined types were placed in this encounter.  Orders Placed This Encounter  Procedures   EKG 12-Lead     No follow-ups on file.     Jerrol Banana, MD, [facility]   Primary Care Sports Medicine Primary Care and Sports Medicine at [facility]

## 2023-02-03 ENCOUNTER — Encounter: Payer: Self-pay | Admitting: Family Medicine

## 2023-02-03 ENCOUNTER — Other Ambulatory Visit: Payer: Self-pay

## 2023-02-03 DIAGNOSIS — K219 Gastro-esophageal reflux disease without esophagitis: Secondary | ICD-10-CM

## 2023-02-03 MED ORDER — PANTOPRAZOLE SODIUM 40 MG PO TBEC
DELAYED_RELEASE_TABLET | ORAL | 3 refills | Status: DC
Start: 2023-02-03 — End: 2024-02-12

## 2023-02-14 ENCOUNTER — Other Ambulatory Visit: Payer: Self-pay | Admitting: Specialist

## 2023-02-15 ENCOUNTER — Ambulatory Visit
Admission: RE | Admit: 2023-02-15 | Discharge: 2023-02-15 | Disposition: A | Discharge status: Home / Self Care | Payer: 59 | Source: Ambulatory Visit | Attending: Specialist | Admitting: Specialist

## 2023-02-19 ENCOUNTER — Encounter: Payer: Self-pay | Admitting: Family Medicine

## 2023-02-20 NOTE — Telephone Encounter (Signed)
fyi

## 2023-02-20 NOTE — Telephone Encounter (Signed)
Please advise, last refill 11/2022 30 with 1 refill

## 2023-02-22 ENCOUNTER — Other Ambulatory Visit: Payer: Self-pay

## 2023-02-22 DIAGNOSIS — R109 Unspecified abdominal pain: Secondary | ICD-10-CM

## 2023-02-22 MED ORDER — TIZANIDINE HCL 4 MG PO TABS: 4.0000 mg | ORAL_TABLET | Freq: Three times a day (TID) | ORAL | 0 refills | Status: DC | PRN

## 2023-03-13 ENCOUNTER — Telehealth: Payer: Self-pay

## 2023-03-13 ENCOUNTER — Other Ambulatory Visit: Payer: Self-pay | Admitting: Family Medicine

## 2023-03-13 ENCOUNTER — Encounter: Payer: Self-pay | Admitting: Family Medicine

## 2023-03-13 DIAGNOSIS — M47817 Spondylosis without myelopathy or radiculopathy, lumbosacral region: Secondary | ICD-10-CM

## 2023-03-13 NOTE — Telephone Encounter (Signed)
Please review.  KP

## 2023-03-13 NOTE — Telephone Encounter (Signed)
Gabapentin medication has been sent in for pt.  KP

## 2023-03-23 ENCOUNTER — Ambulatory Visit (INDEPENDENT_AMBULATORY_CARE_PROVIDER_SITE_OTHER): Payer: 59 | Admitting: Podiatry

## 2023-03-23 ENCOUNTER — Encounter: Payer: Self-pay | Admitting: Podiatry

## 2023-03-23 VITALS — Ht 69.0 in | Wt 289.0 lb

## 2023-03-23 DIAGNOSIS — L600 Ingrowing nail: Secondary | ICD-10-CM

## 2023-03-23 DIAGNOSIS — S93401A Sprain of unspecified ligament of right ankle, initial encounter: Secondary | ICD-10-CM
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
## 2023-03-24 ENCOUNTER — Ambulatory Visit (INDEPENDENT_AMBULATORY_CARE_PROVIDER_SITE_OTHER): Payer: Self-pay | Admitting: Family Medicine

## 2023-03-24 VITALS — BP 110/78 | HR 103 | Ht 69.0 in | Wt 301.0 lb

## 2023-03-24 DIAGNOSIS — R0609 Other forms of dyspnea: Secondary | ICD-10-CM

## 2023-03-24 MED ORDER — AIRSUPRA 90-80 MCG/ACT IN AERO: 2.0000 | INHALATION_SPRAY | RESPIRATORY_TRACT | 1 refills | Status: DC

## 2023-03-27 ENCOUNTER — Other Ambulatory Visit: Payer: Self-pay | Admitting: Family Medicine

## 2023-03-27 DIAGNOSIS — R109 Unspecified abdominal pain: Secondary | ICD-10-CM

## 2023-03-28 NOTE — Telephone Encounter (Signed)
Requested medication (s) are due for refill today: Yes  Requested medication (s) are on the active medication list: Yes  Last refill:  02/22/23 #30, 0RF  Future visit scheduled: Yes  Notes to clinic:  Unable to refill per protocol, cannot delegate.      Requested Prescriptions  Pending Prescriptions Disp Refills   tiZANidine (ZANAFLEX) 4 MG tablet [Pharmacy Med Name: TIZANIDINE 4MG  TABLETS] 30 tablet 0    Sig: TAKE 1 TABLET(4 MG) BY MOUTH EVERY 8 HOURS AS NEEDED FOR MUSCLE SPASMS     Not Delegated - Cardiovascular:  Alpha-2 Agonists - tizanidine Failed - 03/27/2023 11:31 AM      Failed - This refill cannot be delegated      Passed - Valid encounter within last 6 months    Recent Outpatient Visits           4 days ago Other form of dyspnea   [place] [facility], Ocie Bob, MD   2 months ago Pre-op evaluation   [place] [facility] at [facility], Ocie Bob, MD   3 months ago Insomnia due to medical condition   [facility] at [facility], Ocie Bob, MD   6 months ago Left flank pain   [place] [facility] at [facility], Ocie Bob, MD   7 months ago Heat intolerance   [place] [facility] at [facility], Ocie Bob, MD       Future Appointments             In 1 month Ashley Royalty, Ocie Bob, MD [facility] Primary Care & Sports Medicine at [facility], [facility]

## 2023-03-29 ENCOUNTER — Encounter: Payer: Self-pay | Admitting: Family Medicine

## 2023-03-29 NOTE — Assessment & Plan Note (Signed)
Ongoing x 4-5 months in setting of OSA using CPAP, Body mass index is 44.45 kg/m., anxiety, and prior palpitations. States that symptoms are most noted with increased physical activity and sleeping. She has found her CPAP to be less effective over this time period, has also noted steady weight gain from record review 289 lbs (8/13) to 301 (11/1). She denies any changes over this duration otherwise, no new exposures, medications, etc.  Exam without JVD, in no resp distress, speaking in full sentences, tachy cardiac sounds benign otherwise, lung fields are clear without bibasilar rales, trace pedal edema.  Differential can include needing adjustment on CPAP, cardiac vs pulmonologic etiology, among others.  Plan: - Contact your Sleep Medicine doctor about scheduling a follow-up for this issue - Coordinator will contact you to schedule echocardiogram stress test - Can trial Airsupra inhalter and continue

## 2023-03-29 NOTE — Telephone Encounter (Signed)
Please review.  KP

## 2023-03-30 ENCOUNTER — Encounter: Payer: Self-pay | Admitting: Family Medicine

## 2023-03-30 ENCOUNTER — Ambulatory Visit: Payer: 59 | Admitting: Podiatry

## 2023-03-30 ENCOUNTER — Other Ambulatory Visit: Payer: Self-pay | Admitting: Family Medicine

## 2023-03-30 DIAGNOSIS — R109 Unspecified abdominal pain: Secondary | ICD-10-CM

## 2023-04-04 NOTE — Progress Notes (Signed)
     Primary Care / Sports Medicine Office Visit  Patient Information:  Patient ID: Jamie Reeves, female DOB: 31-May-1970 Age: 52 y.o. MRN: [id number]   Jamie Reeves is a pleasant 52 y.o. female presenting with the following:  Chief Complaint  Patient presents with   Shortness of Breath    X4-5 months,Having trouble catching breath when sleeping or getting over active, not going away , uses Cpap still feels like she is gasping for air with it on     Vitals:   03/24/23 1635  BP: 110/78  Pulse: (!) 103  SpO2: 97%   Vitals:   03/24/23 1635  Weight: (!) 301 lb (136.5 kg)  Height: 5\' 9"  (1.753 m)   Body mass index is 44.45 kg/m.  No results found.   Independent interpretation of notes and tests performed by another provider:   None  Procedures performed:   None  Pertinent History, Exam, Impression, and Recommendations:   Problem List Items Addressed This Visit       Other   Dyspnea - Primary    Ongoing x 4-5 months in setting of OSA using CPAP, Body mass index is 44.45 kg/m., anxiety, and prior palpitations. States that symptoms are most noted with increased physical activity and sleeping. She has found her CPAP to be less effective over this time period, has also noted steady weight gain from record review 289 lbs (8/13) to 301 (11/1). She denies any changes over this duration otherwise, no new exposures, medications, etc.  Exam without JVD, in no resp distress, speaking in full sentences, tachy cardiac sounds benign otherwise, lung fields are clear without bibasilar rales, trace pedal edema.  Differential can include needing adjustment on CPAP, cardiac vs pulmonologic etiology, among others.  Plan: - Contact your Sleep Medicine doctor about scheduling a follow-up for this issue - Coordinator will contact you to schedule echocardiogram stress test - Can trial Airsupra inhalter and continue       Relevant Medications   Albuterol-Budesonide (AIRSUPRA) 90-80  MCG/ACT AERO   Other Relevant Orders   ECHOCARDIOGRAM STRESS TEST     Orders & Medications Medications:  Meds ordered this encounter  Medications   Albuterol-Budesonide (AIRSUPRA) 90-80 MCG/ACT AERO    Sig: Inhale 2 Inhalations into the lungs as directed. Two inhalations every 20 minutes as needed for up to 3 doses (6 inhalations total); then may administer 2 inhalations every 1 to 4 hours as needed. Do not exceed 12 inhalations in a 24-hour period.    Dispense:  10.7 g    Refill:  1   Orders Placed This Encounter  Procedures   ECHOCARDIOGRAM STRESS TEST     No follow-ups on file.     Jerrol Banana, MD, [facility]   Primary Care Sports Medicine Primary Care and Sports Medicine at [facility]

## 2023-04-18 ENCOUNTER — Encounter: Payer: Self-pay | Admitting: Podiatry

## 2023-04-18 ENCOUNTER — Ambulatory Visit (INDEPENDENT_AMBULATORY_CARE_PROVIDER_SITE_OTHER): Payer: 59 | Admitting: Podiatry

## 2023-04-18 DIAGNOSIS — M722 Plantar fascial fibromatosis: Secondary | ICD-10-CM | POA: Diagnosis not present

## 2023-04-18 DIAGNOSIS — S93401A Sprain of unspecified ligament of right ankle, initial encounter: Secondary | ICD-10-CM
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
## 2023-04-24 ENCOUNTER — Other Ambulatory Visit: Payer: Self-pay | Admitting: Podiatry

## 2023-04-25 ENCOUNTER — Ambulatory Visit: Payer: 59 | Admitting: Podiatry

## 2023-04-26 ENCOUNTER — Ambulatory Visit: Admission: RE | Admit: 2023-04-26 | Payer: 59 | Source: Home / Self Care | Admitting: Internal Medicine

## 2023-04-26 ENCOUNTER — Encounter: Admission: RE | Payer: Self-pay | Source: Home / Self Care

## 2023-04-26 DIAGNOSIS — R9430 Abnormal result of cardiovascular function study, unspecified: Secondary | ICD-10-CM

## 2023-04-26 SURGERY — LEFT HEART CATH AND CORONARY ANGIOGRAPHY
Anesthesia: Moderate Sedation | Laterality: Left

## 2023-04-30 ENCOUNTER — Other Ambulatory Visit: Payer: Self-pay | Admitting: Family Medicine

## 2023-04-30 DIAGNOSIS — R109 Unspecified abdominal pain: Secondary | ICD-10-CM

## 2023-05-01 ENCOUNTER — Other Ambulatory Visit (HOSPITAL_COMMUNITY): Payer: Self-pay | Admitting: *Deleted

## 2023-05-01 ENCOUNTER — Other Ambulatory Visit: Payer: Self-pay | Admitting: Internal Medicine

## 2023-05-01 ENCOUNTER — Encounter (HOSPITAL_COMMUNITY): Payer: Self-pay

## 2023-05-01 DIAGNOSIS — R9439 Abnormal result of other cardiovascular function study: Secondary | ICD-10-CM

## 2023-05-01 MED ORDER — METOPROLOL TARTRATE 100 MG PO TABS: ORAL_TABLET | ORAL | 0 refills | Status: DC

## 2023-05-01 MED ORDER — IVABRADINE HCL 7.5 MG PO TABS: ORAL_TABLET | ORAL | 0 refills | Status: DC

## 2023-05-02 NOTE — Telephone Encounter (Signed)
Please advise. Thank you

## 2023-05-02 NOTE — Telephone Encounter (Signed)
Requested medication (s) are due for refill today: yes  Requested medication (s) are on the active medication list: yes  Last refill:  03/30/23 #30  Future visit scheduled: yes  Notes to clinic:  med not delegated to NT to RF   Requested Prescriptions  Pending Prescriptions Disp Refills   tiZANidine (ZANAFLEX) 4 MG tablet [Pharmacy Med Name: TIZANIDINE 4MG  TABLETS] 30 tablet 0    Sig: TAKE 1 TABLET(4 MG) BY MOUTH EVERY 8 HOURS AS NEEDED FOR MUSCLE SPASMS     Not Delegated - Cardiovascular:  Alpha-2 Agonists - tizanidine Failed - 04/30/2023  7:25 PM      Failed - This refill cannot be delegated      Passed - Valid encounter within last 6 months    Recent Outpatient Visits           1 month ago Other form of dyspnea   [place] [facility] at [facility] Ashley Royalty, Ocie Bob, MD   3 months ago Pre-op evaluation   [place] [facility], Ocie Bob, MD   4 months ago Insomnia due to medical condition   [facility] at [facility], Ocie Bob, MD   7 months ago Left flank pain   [place] [facility], Ocie Bob, MD   8 months ago Heat intolerance    Primary Care & Sports Medicine at [facility], Ocie Bob, MD       Future Appointments             In 1 week Ashley Royalty, Ocie Bob, MD [facility] Care & [facility] Medicine at [facility]

## 2023-05-03 ENCOUNTER — Ambulatory Visit
Admission: RE | Admit: 2023-05-03 | Discharge: 2023-05-03 | Disposition: A | Discharge status: Home / Self Care | Payer: 59 | Source: Ambulatory Visit | Attending: Internal Medicine | Admitting: Internal Medicine

## 2023-05-03 DIAGNOSIS — R9439 Abnormal result of other cardiovascular function study: Secondary | ICD-10-CM | POA: Insufficient documentation

## 2023-05-03 LAB — GLUCOSE, CAPILLARY: Glucose-Capillary: 96 mg/dL (ref 70–99)

## 2023-05-03 MED ORDER — IOHEXOL 350 MG/ML SOLN
80.0000 mL | Freq: Once | INTRAVENOUS | Status: AC | PRN
  Administered 2023-05-03: 80 mL via INTRAVENOUS

## 2023-05-03 MED ORDER — NITROGLYCERIN 0.4 MG SL SUBL
0.8000 mg | SUBLINGUAL_TABLET | Freq: Once | SUBLINGUAL | Status: AC
  Administered 2023-05-03: 0.4 mg via SUBLINGUAL
  Filled 2023-05-03: qty 25

## 2023-05-03 MED ORDER — SODIUM CHLORIDE 0.9 % IV BOLUS
1000.0000 mL | Freq: Once | INTRAVENOUS | Status: AC
  Administered 2023-05-03: 1000 mL via INTRAVENOUS

## 2023-05-03 NOTE — Progress Notes (Signed)

## 2023-05-10 ENCOUNTER — Encounter: Payer: Self-pay | Admitting: Family Medicine

## 2023-05-10 ENCOUNTER — Ambulatory Visit: Payer: 59 | Admitting: Family Medicine

## 2023-05-10 VITALS — BP 118/70 | HR 89 | Ht 69.0 in | Wt 292.0 lb

## 2023-05-10 DIAGNOSIS — E782 Mixed hyperlipidemia: Secondary | ICD-10-CM

## 2023-05-10 DIAGNOSIS — F418 Other specified anxiety disorders: Secondary | ICD-10-CM | POA: Diagnosis not present

## 2023-05-10 DIAGNOSIS — Z131 Encounter for screening for diabetes mellitus: Secondary | ICD-10-CM

## 2023-05-10 DIAGNOSIS — R0609 Other forms of dyspnea: Secondary | ICD-10-CM | POA: Diagnosis not present

## 2023-05-10 DIAGNOSIS — Z0000 Encounter for general adult medical examination without abnormal findings: Secondary | ICD-10-CM

## 2023-05-10 DIAGNOSIS — R109 Unspecified abdominal pain: Secondary | ICD-10-CM

## 2023-05-10 DIAGNOSIS — M4727 Other spondylosis with radiculopathy, lumbosacral region: Secondary | ICD-10-CM

## 2023-05-10 DIAGNOSIS — J309 Allergic rhinitis, unspecified: Secondary | ICD-10-CM

## 2023-05-10 DIAGNOSIS — Z6841 Body Mass Index (BMI) 40.0 and over, adult: Secondary | ICD-10-CM

## 2023-05-10 DIAGNOSIS — G5793 Unspecified mononeuropathy of bilateral lower limbs: Secondary | ICD-10-CM

## 2023-05-10 DIAGNOSIS — G4733 Obstructive sleep apnea (adult) (pediatric): Secondary | ICD-10-CM

## 2023-05-10 DIAGNOSIS — J329 Chronic sinusitis, unspecified: Secondary | ICD-10-CM

## 2023-05-10 DIAGNOSIS — G2581 Restless legs syndrome: Secondary | ICD-10-CM

## 2023-05-10 DIAGNOSIS — K219 Gastro-esophageal reflux disease without esophagitis: Secondary | ICD-10-CM

## 2023-05-10 MED ORDER — AZELASTINE HCL 0.1 % NA SOLN: 2.0000 | Freq: Two times a day (BID) | NASAL | 1 refills | Status: DC

## 2023-05-10 MED ORDER — AUVELITY 45-105 MG PO TBCR: 1.0000 | EXTENDED_RELEASE_TABLET | Freq: Two times a day (BID) | ORAL | 11 refills | Status: DC

## 2023-05-10 MED ORDER — ROPINIROLE HCL 0.25 MG PO TABS: ORAL_TABLET | ORAL | 3 refills | Status: AC

## 2023-05-10 MED ORDER — AIRSUPRA 90-80 MCG/ACT IN AERO: 2.0000 | INHALATION_SPRAY | RESPIRATORY_TRACT | 1 refills | Status: AC

## 2023-05-10 MED ORDER — TRAZODONE HCL 50 MG PO TABS: 50.0000 mg | ORAL_TABLET | Freq: Every evening | ORAL | 2 refills | Status: DC | PRN

## 2023-05-10 MED ORDER — TIZANIDINE HCL 4 MG PO TABS: 4.0000 mg | ORAL_TABLET | Freq: Three times a day (TID) | ORAL | 2 refills | Status: DC | PRN

## 2023-05-10 MED ORDER — CITALOPRAM HYDROBROMIDE 40 MG PO TABS: 40.0000 mg | ORAL_TABLET | Freq: Every day | ORAL | 3 refills | Status: DC

## 2023-05-11 ENCOUNTER — Encounter: Payer: Self-pay | Admitting: Family Medicine

## 2023-05-11 DIAGNOSIS — G2581 Restless legs syndrome: Secondary | ICD-10-CM | POA: Insufficient documentation

## 2023-05-11 DIAGNOSIS — J309 Allergic rhinitis, unspecified: Secondary | ICD-10-CM | POA: Insufficient documentation

## 2023-05-11 NOTE — Assessment & Plan Note (Signed)
Patient reports mood fluctuations, possibly related to stress from upcoming surgery and insurance changes. Currently taking Auvelity and Celexa. -Continue Auvelity and Celexa. -Encourage patient to discuss medication management with new psychiatrist at [facility].

## 2023-05-11 NOTE — Assessment & Plan Note (Signed)
Gastroesophageal Reflux Disease (GERD) Patient reports ongoing reflux symptoms. Hiatal hernia repair during bariatric surgery upcoming. -Continue current management.

## 2023-05-11 NOTE — Assessment & Plan Note (Signed)
Patient reports occasional sinus congestion, possibly related to weather changes. Currently using Astelin nasal spray. -Continue Astelin nasal spray as needed.

## 2023-05-11 NOTE — Patient Instructions (Addendum)
-   Obtain fasting labs with orders provided (can have water or black coffee but otherwise no food or drink x 8 hours before labs) - Review information provided - Attend eye doctor annually, dentist every 6 months, work towards or maintain 30 minutes of moderate intensity physical activity at least 5 days per week, and consume a balanced diet - Return in 1 year for physical - Contact us for any questions between now and then  Please schedule a follow-up appointment in four months (April 2025).

## 2023-05-11 NOTE — Assessment & Plan Note (Signed)
Sleep Apnea Patient reports inconsistent sleep quality. Currently managed by ENT with CPAP. -Continue current management with CPAP.

## 2023-05-11 NOTE — Assessment & Plan Note (Signed)
Annual examination completed, risk stratification labs ordered, anticipatory guidance provided.  We will follow labs once resulted.  General Health Maintenance -Order colon cancer screening (Cologuard) and mammogram in April 2025.. -Schedule follow-up appointment in four months (April 2025).
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
## 2023-05-11 NOTE — Assessment & Plan Note (Signed)
Chronic issue, previously managed by [facility]. Patient reports constant aching.  Revisit further interventions after recovery from bariatric surgery. -Continue current management.

## 2023-05-11 NOTE — Assessment & Plan Note (Signed)
Peripheral Neuropathy Patient reports intermittent numbness and tingling in feet, particularly at night. -Continue current management.

## 2023-05-11 NOTE — Assessment & Plan Note (Signed)
Bariatric Surgery Duodenal switch and hiatal hernia repair scheduled for 05/16/2023. Patient tolerating preoperative liquid diet well. -Continue preoperative liquid diet.

## 2023-05-15 ENCOUNTER — Other Ambulatory Visit: Payer: Self-pay | Admitting: Family Medicine

## 2023-05-15 DIAGNOSIS — M47817 Spondylosis without myelopathy or radiculopathy, lumbosacral region: Secondary | ICD-10-CM

## 2023-05-16 NOTE — Telephone Encounter (Signed)
Requested Prescriptions  Pending Prescriptions Disp Refills   gabapentin (NEURONTIN) 300 MG capsule [Pharmacy Med Name: GABAPENTIN 300MG  CAPSULES] 90 capsule 0    Sig: TAKE 1 CAPSULE(300 MG) BY MOUTH THREE TIMES DAILY     Neurology: Anticonvulsants - gabapentin Failed - 05/16/2023  1:28 PM      Failed - Cr in normal range and within 360 days    Creatinine, Ser  Date Value Ref Range Status  04/18/2022 0.83 0.57 - 1.00 mg/dL Final         Passed - Completed PHQ-2 or PHQ-9 in the last 360 days      Passed - Valid encounter within last 12 months    Recent Outpatient Visits           6 days ago Healthcare maintenance   [facility] at [facility], Ocie Bob, MD   1 month ago Other form of dyspnea   [facility] at [facility], Ocie Bob, MD   3 months ago Pre-op evaluation   [place] [facility], Ocie Bob, MD   5 months ago Insomnia due to medical condition   [facility] Medicine at [facility], Ocie Bob, MD   7 months ago Left flank pain   [place] [facility] Medicine at [facility], Ocie Bob, MD       Future Appointments             In 3 months Ashley Royalty, Ocie Bob, MD [facility] Primary Care & Sports Medicine at [facility], [facility]

## 2023-05-18 ENCOUNTER — Telehealth: Payer: Self-pay

## 2023-05-18 NOTE — Telephone Encounter (Signed)
Completed PA on [email or web address] for Auvelity.  Per PA patient may need to fail THREE alternatives on the formulary. These are: (1) bupropion, (2) citalopram, (3) duloxetine, (4) escitalopram, (5) fluoxetine, (6) fluvoxamine, (7) paroxetine, (8) sertraline, (9) venlafaxine IR/ER (capsules)  Patient has tried and failed citalopram, and duloxetine.   (Key: [id number]) Auvelity 45-105MG  er tablets Form [employer] Electronic Prior Authorization Form (2017 NCPDP)  Awaiting outcome.

## 2023-05-23 NOTE — Telephone Encounter (Signed)
Resubmitted PA with this information.  New Key: (Key: [id number]) Auvelity 45-105MG  er tablets

## 2023-05-23 NOTE — Telephone Encounter (Signed)
AUVELITY TAB 45-105MG  is approved through 05/22/2024 since patient and has tried and failed three alternatives.

## 2023-05-23 NOTE — Telephone Encounter (Signed)
 Patient PA was DENIED. Patient to to try and fail THREE formulary alternatives before they will cover Auvelity .   Alternatives include: (1) bupropion , (2) citalopram , (3) duloxetine , (4) escitalopram, (5) fluoxetine, (6) fluvoxamine, (7) paroxetine, (8) sertraline, (9) venlafaxine IR/ER (capsules)   Patient has tried and failed citalopram , and duloxetine .  Please review for next steps.

## 2023-06-05 ENCOUNTER — Ambulatory Visit: Payer: 59 | Admitting: Family Medicine

## 2023-06-05 ENCOUNTER — Encounter: Payer: Self-pay | Admitting: Family Medicine

## 2023-06-05 DIAGNOSIS — J309 Allergic rhinitis, unspecified: Secondary | ICD-10-CM

## 2023-06-05 MED ORDER — TIZANIDINE HCL 4 MG PO TABS: 4.0000 mg | ORAL_TABLET | Freq: Three times a day (TID) | ORAL | 0 refills | Status: DC | PRN

## 2023-06-05 NOTE — Assessment & Plan Note (Addendum)
 They also report a scratchy throat and sinus issues, which they believe may be due to being outside in the cold.  Physical Exam General: Well Developed, well nourished, and in no acute distress.  HEENT: No lymphadenopathy. Oropharynx, nasopharynx with mild erythema, no swelling, external ear canals are unremarkable. Bilateral tympanic membranes benign. Skin: Warm and dry, no rashes noted.  Respiratory: Clear to auscultation bilaterally. Speaking in full sentences.    Upper respiratory symptoms Patient reports scratchy throat and sinus issues. Physical examination shows mild redness in the nostrils, but no other significant findings. -Recommend over-the-counter antihistamines, Vitamin C, D, and Zinc supplementation. -Advise patient to hydrate and rest. -If symptoms persist or worsen, patient to contact the clinic.
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
## 2023-06-07 ENCOUNTER — Encounter: Payer: Self-pay | Admitting: Family Medicine

## 2023-06-07 NOTE — Telephone Encounter (Signed)
 Please call pt to schedule an appt.  KP

## 2023-06-08 ENCOUNTER — Encounter: Payer: Self-pay | Admitting: Family Medicine

## 2023-06-08 ENCOUNTER — Ambulatory Visit: Payer: 59 | Admitting: Family Medicine

## 2023-06-08 VITALS — BP 116/80 | HR 113 | Temp 98.2°F | Ht 69.0 in | Wt 267.2 lb

## 2023-06-08 DIAGNOSIS — M79662 Pain in left lower leg: Secondary | ICD-10-CM | POA: Diagnosis not present

## 2023-06-08 DIAGNOSIS — R519 Headache, unspecified: Secondary | ICD-10-CM | POA: Diagnosis not present

## 2023-06-08 DIAGNOSIS — R059 Cough, unspecified: Secondary | ICD-10-CM

## 2023-06-08 DIAGNOSIS — U071 COVID-19: Secondary | ICD-10-CM | POA: Diagnosis not present

## 2023-06-08 DIAGNOSIS — R051 Acute cough: Secondary | ICD-10-CM

## 2023-06-08 LAB — POC COVID19 BINAXNOW: SARS Coronavirus 2 Ag: POSITIVE — AB

## 2023-06-08 MED ORDER — NIRMATRELVIR/RITONAVIR (PAXLOVID)TABLET: 3.0000 | ORAL_TABLET | Freq: Two times a day (BID) | ORAL | 0 refills | Status: AC

## 2023-06-08 MED ORDER — PROMETHAZINE-DM 6.25-15 MG/5ML PO SYRP: 5.0000 mL | ORAL_SOLUTION | Freq: Four times a day (QID) | ORAL | 0 refills | Status: DC | PRN

## 2023-06-09 ENCOUNTER — Ambulatory Visit: Payer: Self-pay | Admitting: *Deleted

## 2023-06-09 ENCOUNTER — Ambulatory Visit: Payer: 59

## 2023-06-09 NOTE — Telephone Encounter (Signed)
  Chief Complaint: Smart watch indicating oxygen level is 88%.    Can't find her pulse oximeter.    Not feeling any more shortness of breath or chest tightness than when seen yesterday in the office and diagnosed with Covid Symptoms: shortness of breath, chest tightness and a lot of sinus congestion but unchanged from yesterday.   Just concerned that her smart watch indicates her oxygen is 88%. Frequency: Today      Last night it was 94%. Pertinent Negatives: Patient denies worsening of symptoms.   Has taken 2 doses of the antiviral.    Disposition: [] ED /[] Urgent Care (no appt availability in office) / [] Appointment(In office/virtual)/ []  [facility]/ [] Home Care/ [] Refused Recommended Disposition /[] [place] [facility]/ [x]  Follow-up with PCP Additional Notes: Update being sent to Dr. Joseph Berkshire.

## 2023-06-09 NOTE — Telephone Encounter (Signed)
Reason for Disposition  Oxygen level (e.g., pulse oximetry) 91 to 94 percent    Smart watch indicating O2 level 88%.   Diagnosed with Covid yesterday in the office.   Started on antiviral.   Shortness of breath and chest tightness unchanged from when seen yesterday.  Answer Assessment - Initial Assessment Questions 1. RESPIRATORY STATUS: "Describe your breathing?" (e.g., wheezing, shortness of breath, unable to speak, severe coughing)      My watch is saying my O2 level 88%.    I can't tell if it's my nose being stopped up or what.    I've used inhalers all my life.   My symptoms are unchanged from when I was seen yesterday in the office and diagnosed with Covid.     Last night it was 94% on my watch.   Today it's 88%.    I'm feeling tight from the Covid and my sinuses are congested but no different than yesterday.     I'm on the antiviral for Covid.  I've had 2 doses of that.    I'm not on home oxygen.   The tightness in my chest has been going on since I had Covid.   I can't breath through my nose.    It's staying at 88%.   I walked around and it stayed at 88%.   I'm trying to find my pulse ox. But I can't fine it.  I may call my husband to stop on the way home and get another one to double check my oxygen level.      I'm unchanged from when I was seen yesterday in the office.   Same chest tightness and shortness of breath just my watch saying my oxygen is 88% today.   Last night it was 94%.     2. ONSET: "When did this breathing problem begin?"      I was seen in the office yesterday 06/08/2023 and diagnosed with Covid. 3. PATTERN "Does the difficult breathing come and go, or has it been constant since it started?"      Same as when I was seen yesterday.   I don't feel any better or worse and the chest tightness and shortness of breath are unchanged from yesterday. 4. SEVERITY: "How bad is your breathing?" (e.g., mild, moderate, severe)    - MILD: No SOB at rest, mild SOB with walking, speaks  normally in sentences, can lie down, no retractions, pulse < 100.    - MODERATE: SOB at rest, SOB with minimal exertion and prefers to sit, cannot lie down flat, speaks in phrases, mild retractions, audible wheezing, pulse 100-120.    - SEVERE: Very SOB at rest, speaks in single words, struggling to breathe, sitting hunched forward, retractions, pulse > 120      Mild   5. RECURRENT SYMPTOM: "Have you had difficulty breathing before?" If Yes, ask: "When was the last time?" and "What happened that time?"      "I've used inhalers most of my life"  but denied having any underlying lung issues when asked. 6. CARDIAC HISTORY: "Do you have any history of heart disease?" (e.g., heart attack, angina, bypass surgery, angioplasty)      Not asked 7. LUNG HISTORY: "Do you have any history of lung disease?"  (e.g., pulmonary embolus, asthma, emphysema)     No but used inhalers most of my life. 8. CAUSE: "What do you think is causing the breathing problem?"      Covid 9. OTHER SYMPTOMS: "  Do you have any other symptoms? (e.g., dizziness, runny nose, cough, chest pain, fever)     A lot of sinus congestion 10. O2 SATURATION MONITOR:  "Do you use an oxygen saturation monitor (pulse oximeter) at home?" If Yes, ask: "What is your reading (oxygen level) today?" "What is your usual oxygen saturation reading?" (e.g., 95%)       Can't find it.   Using smart watch which is 88%.   Has a pulse ox but can't find it. 11. PREGNANCY: "Is there any chance you are pregnant?" "When was your last menstrual period?"       Not asked 12. TRAVEL: "Have you traveled out of the country in the last month?" (e.g., travel history, exposures)       Not asked  Protocols used: Breathing Difficulty-A-AH

## 2023-06-09 NOTE — Telephone Encounter (Signed)
 Please review. JM

## 2023-06-13 ENCOUNTER — Ambulatory Visit
Admission: EM | Admit: 2023-06-13 | Discharge: 2023-06-13 | Disposition: A | Discharge status: Home / Self Care | Payer: 59 | Attending: Emergency Medicine | Admitting: Emergency Medicine

## 2023-06-13 ENCOUNTER — Ambulatory Visit: Payer: 59

## 2023-06-13 ENCOUNTER — Encounter: Payer: Self-pay | Admitting: Family Medicine

## 2023-06-13 ENCOUNTER — Encounter: Payer: Self-pay | Admitting: Emergency Medicine

## 2023-06-13 DIAGNOSIS — S8012XA Contusion of left lower leg, initial encounter: Secondary | ICD-10-CM | POA: Insufficient documentation

## 2023-06-13 DIAGNOSIS — Z9884 Bariatric surgery status: Secondary | ICD-10-CM | POA: Diagnosis not present

## 2023-06-13 DIAGNOSIS — M79662 Pain in left lower leg: Secondary | ICD-10-CM | POA: Insufficient documentation

## 2023-06-13 DIAGNOSIS — Z7901 Long term (current) use of anticoagulants: Secondary | ICD-10-CM | POA: Insufficient documentation
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
## 2023-06-13 NOTE — Discharge Instructions (Addendum)
I suspect your pain is coming from a large bruise of your calf.  We will contact you if your ultrasound comes back positive for DVT and we will discuss next steps.  In the meantime continue with also milligrams of Tylenol 3 times a day, Zanaflex.  Norco for severe pain.  Warm or cool compresses, whichever feels better.  Continue elevation.  Follow-up with Dr. Ether Griffins at the podiatry [facility] clinic or with [facility] if your symptoms persist.
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
## 2023-06-14 ENCOUNTER — Ambulatory Visit: Payer: 59

## 2023-06-14 LAB — POCT INFLUENZA A/B
Influenza A, POC: NEGATIVE
Influenza B, POC: NEGATIVE
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
## 2023-06-15 ENCOUNTER — Encounter: Payer: Self-pay | Admitting: Podiatry

## 2023-06-15 ENCOUNTER — Ambulatory Visit: Payer: 59 | Admitting: Podiatry

## 2023-06-15 DIAGNOSIS — T148XXA Other injury of unspecified body region, initial encounter: Secondary | ICD-10-CM
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
## 2023-06-21 ENCOUNTER — Ambulatory Visit: Payer: 59 | Admitting: Podiatry

## 2023-06-22 ENCOUNTER — Encounter: Payer: Self-pay | Admitting: Family Medicine

## 2023-06-26 NOTE — Telephone Encounter (Signed)
 Please review.  KP

## 2023-06-29 ENCOUNTER — Encounter: Payer: Self-pay | Admitting: Family Medicine

## 2023-06-29 NOTE — Telephone Encounter (Signed)
 FYI  KP

## 2023-07-03 ENCOUNTER — Encounter: Payer: Self-pay | Admitting: Family Medicine

## 2023-07-03 NOTE — Telephone Encounter (Signed)
 Please review.  KP

## 2023-07-07 ENCOUNTER — Other Ambulatory Visit: Payer: Self-pay | Admitting: Family Medicine

## 2023-07-07 DIAGNOSIS — M47817 Spondylosis without myelopathy or radiculopathy, lumbosacral region: Secondary | ICD-10-CM
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
## 2023-07-11 NOTE — Telephone Encounter (Signed)
Dr. Molli Hazard they are in the need to be signed pile.

## 2023-07-13 ENCOUNTER — Ambulatory Visit: Payer: 59 | Admitting: Podiatry

## 2023-07-20 ENCOUNTER — Ambulatory Visit: Payer: 59 | Admitting: Podiatry

## 2023-07-20 ENCOUNTER — Encounter: Payer: Self-pay | Admitting: Podiatry

## 2023-07-20 DIAGNOSIS — M7671 Peroneal tendinitis, right leg: Secondary | ICD-10-CM
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
## 2023-08-01 DIAGNOSIS — K449 Diaphragmatic hernia without obstruction or gangrene: Secondary | ICD-10-CM | POA: Insufficient documentation

## 2023-08-01 HISTORY — PX: HIATAL HERNIA REPAIR: SHX195

## 2023-08-17 ENCOUNTER — Ambulatory Visit: Payer: 59 | Admitting: Podiatry

## 2023-08-18 ENCOUNTER — Encounter: Payer: Self-pay | Admitting: Family Medicine

## 2023-08-18 ENCOUNTER — Other Ambulatory Visit: Payer: Self-pay | Admitting: Family Medicine

## 2023-08-18 ENCOUNTER — Other Ambulatory Visit: Payer: Self-pay

## 2023-08-18 NOTE — Telephone Encounter (Signed)
 Please let me know of it is okay to put referral in and for what reason /dx?

## 2023-08-21 ENCOUNTER — Other Ambulatory Visit: Payer: Self-pay

## 2023-08-21 DIAGNOSIS — G4701 Insomnia due to medical condition: Secondary | ICD-10-CM

## 2023-08-21 DIAGNOSIS — M4727 Other spondylosis with radiculopathy, lumbosacral region: Secondary | ICD-10-CM

## 2023-08-21 MED ORDER — TRAZODONE HCL 50 MG PO TABS: 50.0000 mg | ORAL_TABLET | Freq: Every evening | ORAL | 2 refills | Status: DC | PRN

## 2023-08-21 NOTE — Telephone Encounter (Signed)
 Refilled 08/21/23. Requested Prescriptions  Refused Prescriptions Disp Refills   traZODone (DESYREL) 50 MG tablet [Pharmacy Med Name: TRAZODONE 50MG  TABLETS] 30 tablet 2    Sig: TAKE 1 TABLET(50 MG) BY MOUTH AT BEDTIME AS NEEDED FOR SLEEP     There is no refill protocol information for this order
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
## 2023-08-23 ENCOUNTER — Ambulatory Visit: Admitting: Physician Assistant

## 2023-08-24 ENCOUNTER — Ambulatory Visit: Admitting: Family Medicine

## 2023-08-24 ENCOUNTER — Ambulatory Visit: Admitting: Podiatry

## 2023-08-24 ENCOUNTER — Encounter: Payer: Self-pay | Admitting: Family Medicine

## 2023-08-24 VITALS — BP 116/78 | HR 96 | Ht 69.0 in | Wt 232.0 lb

## 2023-08-24 DIAGNOSIS — F321 Major depressive disorder, single episode, moderate: Secondary | ICD-10-CM

## 2023-08-24 DIAGNOSIS — M62461 Contracture of muscle, right lower leg: Secondary | ICD-10-CM

## 2023-08-24 DIAGNOSIS — M722 Plantar fascial fibromatosis: Secondary | ICD-10-CM

## 2023-08-24 DIAGNOSIS — G4701 Insomnia due to medical condition: Secondary | ICD-10-CM

## 2023-08-24 MED ORDER — TRAZODONE HCL 50 MG PO TABS
50.0000 mg | ORAL_TABLET | Freq: Every evening | ORAL | 0 refills | Status: DC | PRN
Start: 2023-08-24 — End: 2023-09-07

## 2023-08-24 MED ORDER — CARIPRAZINE HCL 1.5 MG PO CAPS: 1.5000 mg | ORAL_CAPSULE | Freq: Every day | ORAL | 0 refills | Status: DC
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
## 2023-08-24 NOTE — Patient Instructions (Signed)
 Patient Plan for Post-Visit Guidance  1. Depression and Anxiety Management:    - Start taking Vraylar 1.5 mg daily.    - Continue taking citalopram (Celexa) 40 mg daily.    - You have been referred to care coordination for mental health resources including behavioral therapy.  2. Insomnia Management:    - Increase trazodone dose by 25 mg each night, as needed, up to a maximum of 200 mg, to help with sleep maintenance.    - Monitor for any side effects or next-day grogginess.    - Send a message to your provider next week to discuss how trazodone is working and any side effects you may experience.    - Schedule a follow-up video visit in two weeks to assess how well the treatment is working.  3. Critical Symptoms to Watch For:    - If you experience any new or worsening symptoms, such as increased anxiety, severe mood changes, or significant side effects from medications, contact your healthcare provider immediately.  This plan is designed to address your current symptoms and adjust your treatment as necessary. Please follow these steps and reach out if you have any questions or concerns.
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
## 2023-08-28 ENCOUNTER — Inpatient Hospital Stay
Admission: RE | Admit: 2023-08-28 | Discharge: 2023-08-28 | Disposition: A | Discharge status: Home / Self Care | Payer: Self-pay | Source: Ambulatory Visit | Attending: Neurosurgery | Admitting: Neurosurgery

## 2023-08-28 ENCOUNTER — Other Ambulatory Visit: Payer: Self-pay | Admitting: Family Medicine

## 2023-08-28 ENCOUNTER — Encounter: Payer: Self-pay | Admitting: Family Medicine

## 2023-08-28 DIAGNOSIS — Z049 Encounter for examination and observation for unspecified reason: Secondary | ICD-10-CM

## 2023-08-28 NOTE — Telephone Encounter (Signed)
 Please review and advise.   JM

## 2023-08-28 NOTE — Telephone Encounter (Signed)
 Please review. JM

## 2023-08-31 ENCOUNTER — Telehealth: Payer: Self-pay

## 2023-08-31 ENCOUNTER — Other Ambulatory Visit: Payer: Self-pay | Admitting: Licensed Clinical Social Worker

## 2023-08-31 NOTE — Progress Notes (Signed)
 Complex Care Management Note  Care Guide Note 08/31/2023 Name: Jamie Reeves MRN: [id number] DOB: [birth year]  Jamie Reeves is a 53 y.o. year old female who sees Jerrol Banana, MD for primary care. I reached out to Marilynne Halsted by phone today to offer complex care management services.  Ms. Frede was given information about Complex Care Management services today including:   The Complex Care Management services include support from the care team which includes your Nurse Care Manager, Clinical Social Worker, or Pharmacist.  The Complex Care Management team is here to help remove barriers to the health concerns and goals most important to you. Complex Care Management services are voluntary, and the patient may decline or stop services at any time by request to their care team member.   Complex Care Management Consent Status: Patient agreed to services and verbal consent obtained.   Follow up plan:  Telephone appointment with complex care management team member scheduled for:  08/31/2023  Encounter Outcome:  Patient Scheduled  Penne Lash , RMA     [place]  [facility], [facility] Guide  Direct Dial: [phone]  Website: [email or web address]

## 2023-08-31 NOTE — Patient Instructions (Signed)
 Visit Information  Thank you for taking time to visit with me today. Please don't hesitate to contact me if I can be of assistance to you before our next scheduled appointment.  Your next care management appointment is by telephone on 09/14/2023 at 2pm  Face to Face follow up 2 weeks   Please call the care guide team at [phone] if you need to cancel, schedule, or reschedule an appointment.   Please call 911 if you are experiencing a Mental Health or Behavioral Health Crisis or need someone to talk to.  Gwyndolyn Saxon MSW, LCSW Licensed Clinical Social Worker  [facility], Population Health Direct Dial: [phone]  Fax: [phone]

## 2023-09-04 ENCOUNTER — Ambulatory Visit
Admission: RE | Admit: 2023-09-04 | Discharge: 2023-09-04 | Disposition: A | Discharge status: Home / Self Care | Source: Ambulatory Visit | Attending: Physician Assistant | Admitting: Physician Assistant

## 2023-09-04 ENCOUNTER — Ambulatory Visit
Admission: RE | Admit: 2023-09-04 | Discharge: 2023-09-04 | Disposition: A | Discharge status: Home / Self Care | Attending: Physician Assistant | Admitting: Physician Assistant

## 2023-09-04 ENCOUNTER — Ambulatory Visit (INDEPENDENT_AMBULATORY_CARE_PROVIDER_SITE_OTHER): Admitting: Physician Assistant

## 2023-09-04 ENCOUNTER — Encounter: Payer: Self-pay | Admitting: Physician Assistant

## 2023-09-04 VITALS — BP 114/72 | Ht 69.0 in | Wt 223.0 lb

## 2023-09-04 DIAGNOSIS — M542 Cervicalgia: Secondary | ICD-10-CM

## 2023-09-04 DIAGNOSIS — M5441 Lumbago with sciatica, right side: Secondary | ICD-10-CM | POA: Insufficient documentation

## 2023-09-04 MED ORDER — DIAZEPAM 5 MG PO TABS: 5.0000 mg | ORAL_TABLET | Freq: Once | ORAL | 0 refills | Status: AC
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
## 2023-09-07 ENCOUNTER — Telehealth: Admitting: Family Medicine

## 2023-09-07 ENCOUNTER — Encounter: Payer: Self-pay | Admitting: Family Medicine

## 2023-09-07 DIAGNOSIS — G894 Chronic pain syndrome: Secondary | ICD-10-CM | POA: Diagnosis not present

## 2023-09-07 DIAGNOSIS — G4701 Insomnia due to medical condition: Secondary | ICD-10-CM

## 2023-09-07 DIAGNOSIS — F321 Major depressive disorder, single episode, moderate: Secondary | ICD-10-CM

## 2023-09-07 MED ORDER — AMITRIPTYLINE HCL 10 MG PO TABS: 10.0000 mg | ORAL_TABLET | Freq: Every day | ORAL | 3 refills | Status: DC

## 2023-09-07 MED ORDER — TRAZODONE HCL 50 MG PO TABS: 100.0000 mg | ORAL_TABLET | Freq: Every evening | ORAL | 0 refills | Status: DC | PRN

## 2023-09-07 NOTE — Assessment & Plan Note (Signed)
 Significant stress due to family conflicts has led to emotional distress and crying. 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 She is currently seeking therapy for her stress and emotional well-being and has an appointment with a therapist from [facility].  Stress and emotional distress Significant stress and emotional distress due to family conflicts. Behavioral therapy initiated. - Start and continue behavioral therapy through [facility].

## 2023-09-07 NOTE — Patient Instructions (Signed)
 Patient Plan for Post-Visit Guidance  1. Pain Management:    - Start taking amitriptyline 10 mg every night.    - You can increase the dose by 10 mg every two weeks if needed, up to a maximum of 30 mg, to help with both pain and sleep.  2. Sleep Management:    - Continue taking trazodone at three tablets (150 mg) each night.  3. Emotional Well-being:    - Start and continue your behavioral therapy sessions through [facility] to help manage stress and emotional distress.  4. Follow-Up:    - Schedule a video visit in early July to review your pain and sleep management.  Please follow these steps and contact us  if you have any questions or concerns.

## 2023-09-07 NOTE — Assessment & Plan Note (Signed)
 She has seen a neurosurgeon and undergone x-rays and is awaiting an MRI.   Chronic pain Chronic pain possibly related to fibromyalgia or myofascial pain syndrome. Amitriptyline considered for dual pain management and sleep improvement. - Prescribe amitriptyline 10 mg nightly, increase by 10 mg every two + weeks as needed, max 30 mg. Ideally target both appropriate sleep and pain control. - Video visit in early July.

## 2023-09-07 NOTE — Assessment & Plan Note (Signed)
 She has been experiencing ongoing sleep disturbances and has been taking trazodone to aid her sleep. She has experimented with different dosages, finding that three tablets (150 mg) result in inadequate sleep without drowsiness, while four tablets (200 mg) improve sleep but cause drowsiness. A dosage of three and a half tablets still leads to some drowsiness. She typically takes her medication at 8:30 PM.  Sleep disturbances Sleep disturbances managed with trazodone. Amitriptyline considered for dual sleep and pain management. - Prescribe amitriptyline 10 mg nightly, increase by 10 mg every two weeks as needed, max 30 mg. - Continue trazodone at three tablets nightly. - Schedule follow-up video visit in early July to assess sleep quality.
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
## 2023-09-08 ENCOUNTER — Ambulatory Visit: Payer: Self-pay | Admitting: Family Medicine

## 2023-09-08 NOTE — Telephone Encounter (Signed)
 FYI  KP

## 2023-09-11 ENCOUNTER — Other Ambulatory Visit: Payer: Self-pay

## 2023-09-11 MED ORDER — TIZANIDINE HCL 4 MG PO TABS: 4.0000 mg | ORAL_TABLET | Freq: Three times a day (TID) | ORAL | 0 refills | Status: DC | PRN

## 2023-09-12 ENCOUNTER — Other Ambulatory Visit: Payer: Self-pay

## 2023-09-12 ENCOUNTER — Telehealth: Payer: Self-pay | Admitting: Physician Assistant

## 2023-09-12 DIAGNOSIS — M5441 Lumbago with sciatica, right side: Secondary | ICD-10-CM

## 2023-09-12 NOTE — Telephone Encounter (Signed)
 Patient has applied for disability due to her back pain. The paperwork has been received from [employer]. Is it okay to fill out for her? Her physical therapy referral has been sent to [facility] in [place].

## 2023-09-12 NOTE — Progress Notes (Signed)
Order for PT has been placed.

## 2023-09-12 NOTE — Telephone Encounter (Signed)
 Patient calling back. This is for short term disability. She did ask her PCP but he told her to have the back specialist fill it out since they are taking care of her back specifically. Her disability stated on 09/08/2023. Due to her back pain she is unable to do her job. She works has a Systems developer and sits all day. Sitting all day increases her pain.

## 2023-09-13 NOTE — Telephone Encounter (Signed)
 We usually leave that up to the provider. Other providers have placed patients on leave while they have further testing done.

## 2023-09-14 ENCOUNTER — Other Ambulatory Visit: Payer: Self-pay | Admitting: Licensed Clinical Social Worker

## 2023-09-14 NOTE — Patient Instructions (Signed)
 Visit Information  Thank you for taking time to visit with me today. Please don't hesitate to contact me if I can be of assistance to you before our next scheduled appointment.   Patient has met all care management goals. Care Management case will be closed. Patient has been provided contact information should new needs arise.   Please call the care guide team at [phone] if you need to cancel, schedule, or reschedule an appointment.   Please call 911 if you are experiencing a Mental Health or Behavioral Health Crisis or need someone to talk to.  Gwyndolyn Saxon MSW, LCSW Licensed Clinical Social Worker  [facility], Population Health Direct Dial: [phone]  Fax: [phone]
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
## 2023-09-18 NOTE — Telephone Encounter (Signed)
 Patient ntoified that [employer] Disability form has been faxed. She is schedule with [facility].

## 2023-09-25 ENCOUNTER — Other Ambulatory Visit: Payer: Self-pay | Admitting: Family Medicine

## 2023-09-25 DIAGNOSIS — F418 Other specified anxiety disorders: Secondary | ICD-10-CM
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
## 2023-09-26 ENCOUNTER — Ambulatory Visit: Attending: Physician Assistant | Admitting: Physical Therapy

## 2023-09-26 DIAGNOSIS — M5432 Sciatica, left side: Secondary | ICD-10-CM | POA: Diagnosis present

## 2023-09-26 DIAGNOSIS — M5431 Sciatica, right side: Secondary | ICD-10-CM | POA: Diagnosis present

## 2023-09-26 DIAGNOSIS — M546 Pain in thoracic spine: Secondary | ICD-10-CM | POA: Insufficient documentation

## 2023-09-26 DIAGNOSIS — M5441 Lumbago with sciatica, right side: Secondary | ICD-10-CM | POA: Diagnosis not present

## 2023-09-26 DIAGNOSIS — M5459 Other low back pain: Secondary | ICD-10-CM | POA: Insufficient documentation

## 2023-09-27 NOTE — Telephone Encounter (Signed)
 Requested Prescriptions  Pending Prescriptions Disp Refills   citalopram  (CELEXA ) 40 MG tablet [Pharmacy Med Name: CITALOPRAM  40MG  TABLETS] 30 tablet 3    Sig: TAKE 1 TABLET(40 MG) BY MOUTH DAILY     Psychiatry:  Antidepressants - SSRI Passed - 09/27/2023 11:08 AM      Passed - Completed PHQ-2 or PHQ-9 in the last 360 days      Passed - Valid encounter within last 6 months    Recent Outpatient Visits           2 weeks ago Chronic pain syndrome   [facility], Dessie Flow, MD   1 month ago Depression, major, single episode, moderate [facility])   [facility] at [facility], Dessie Flow, MD       Future Appointments             In 1 month Augustus Ledger, Dessie Flow, MD [facility] at [facility], [facility]

## 2023-09-28 ENCOUNTER — Encounter: Payer: Self-pay | Admitting: Physical Therapy

## 2023-09-28 ENCOUNTER — Ambulatory Visit: Admitting: Physical Therapy

## 2023-09-28 DIAGNOSIS — M5432 Sciatica, left side: Secondary | ICD-10-CM

## 2023-09-28 DIAGNOSIS — M5459 Other low back pain: Secondary | ICD-10-CM

## 2023-09-28 DIAGNOSIS — M5431 Sciatica, right side: Secondary | ICD-10-CM

## 2023-09-28 DIAGNOSIS — M546 Pain in thoracic spine: Secondary | ICD-10-CM
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
## 2023-10-03 ENCOUNTER — Ambulatory Visit: Admitting: Physical Therapy

## 2023-10-03 DIAGNOSIS — M546 Pain in thoracic spine: Secondary | ICD-10-CM

## 2023-10-03 DIAGNOSIS — M5459 Other low back pain: Secondary | ICD-10-CM

## 2023-10-03 DIAGNOSIS — M5431 Sciatica, right side: Secondary | ICD-10-CM

## 2023-10-03 DIAGNOSIS — M5432 Sciatica, left side: Secondary | ICD-10-CM
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
## 2023-10-04 ENCOUNTER — Encounter: Payer: Self-pay | Admitting: Physical Therapy

## 2023-10-05 ENCOUNTER — Ambulatory Visit: Admitting: Physical Therapy

## 2023-10-05 DIAGNOSIS — M5459 Other low back pain: Secondary | ICD-10-CM | POA: Diagnosis not present

## 2023-10-05 DIAGNOSIS — M546 Pain in thoracic spine: Secondary | ICD-10-CM

## 2023-10-05 DIAGNOSIS — M5431 Sciatica, right side: Secondary | ICD-10-CM

## 2023-10-05 DIAGNOSIS — M5432 Sciatica, left side: Secondary | ICD-10-CM
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
## 2023-10-08 ENCOUNTER — Encounter: Payer: Self-pay | Admitting: Physical Therapy

## 2023-10-10 ENCOUNTER — Ambulatory Visit: Admitting: Physical Therapy

## 2023-10-10 ENCOUNTER — Other Ambulatory Visit: Payer: Self-pay | Admitting: Family Medicine

## 2023-10-10 DIAGNOSIS — M5459 Other low back pain: Secondary | ICD-10-CM | POA: Diagnosis not present

## 2023-10-10 DIAGNOSIS — M5431 Sciatica, right side: Secondary | ICD-10-CM

## 2023-10-10 DIAGNOSIS — M5432 Sciatica, left side: Secondary | ICD-10-CM

## 2023-10-10 DIAGNOSIS — M546 Pain in thoracic spine: Secondary | ICD-10-CM

## 2023-10-10 NOTE — Telephone Encounter (Signed)
 Requested medication (s) are due for refill today - yes  Requested medication (s) are on the active medication list -yes  Future visit scheduled -yes  Last refill: 09/11/23 #90   Notes to clinic: non delegated Rx  Requested Prescriptions  Pending Prescriptions Disp Refills   tiZANidine  (ZANAFLEX ) 4 MG tablet [Pharmacy Med Name: TIZANIDINE  4MG  TABLETS] 90 tablet 0    Sig: TAKE 1 TABLET(4 MG) BY MOUTH EVERY 8 HOURS AS NEEDED FOR MUSCLE SPASMS     Not Delegated - Cardiovascular:  Alpha-2 Agonists - tizanidine  Failed - 10/10/2023  2:10 PM      Failed - This refill cannot be delegated      Passed - Valid encounter within last 6 months    Recent Outpatient Visits           1 month ago Chronic pain syndrome   [place] [facility] Ledger, Dessie Flow, MD   1 month ago Depression, major, single episode, moderate [facility])   [place] [facility], Dessie Flow, MD       Future Appointments             In 1 month Augustus Ledger, Dessie Flow, MD [facility] at [facility]               Requested Prescriptions  Pending Prescriptions Disp Refills   tiZANidine  (ZANAFLEX ) 4 MG tablet [Pharmacy Med Name: TIZANIDINE  4MG  TABLETS] 90 tablet 0    Sig: TAKE 1 TABLET(4 MG) BY MOUTH EVERY 8 HOURS AS NEEDED FOR MUSCLE SPASMS     Not Delegated - Cardiovascular:  Alpha-2 Agonists - tizanidine  Failed - 10/10/2023  2:10 PM      Failed - This refill cannot be delegated      Passed - Valid encounter within last 6 months    Recent Outpatient Visits           1 month ago Chronic pain syndrome   [place] [facility], Dessie Flow, MD   1 month ago Depression, major, single episode, moderate [facility])   [facility] at [facility], Dessie Flow, MD       Future Appointments             In 1 month  Augustus Ledger, Dessie Flow, MD [facility] at [facility]
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
## 2023-10-11 ENCOUNTER — Other Ambulatory Visit: Payer: Self-pay | Admitting: Family Medicine

## 2023-10-11 ENCOUNTER — Encounter: Payer: Self-pay | Admitting: Physical Therapy

## 2023-10-11 DIAGNOSIS — M47817 Spondylosis without myelopathy or radiculopathy, lumbosacral region: Secondary | ICD-10-CM

## 2023-10-11 NOTE — Telephone Encounter (Signed)
 Please sign if appropriate.   JM

## 2023-10-12 ENCOUNTER — Encounter: Payer: Self-pay | Admitting: Physical Therapy

## 2023-10-12 ENCOUNTER — Ambulatory Visit

## 2023-10-12 DIAGNOSIS — M5431 Sciatica, right side: Secondary | ICD-10-CM

## 2023-10-12 DIAGNOSIS — M5459 Other low back pain: Secondary | ICD-10-CM | POA: Diagnosis not present

## 2023-10-12 DIAGNOSIS — M546 Pain in thoracic spine: Secondary | ICD-10-CM

## 2023-10-12 DIAGNOSIS — M5432 Sciatica, left side: Secondary | ICD-10-CM

## 2023-10-12 NOTE — Telephone Encounter (Signed)
 Please review.  KP
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
## 2023-10-12 NOTE — Telephone Encounter (Signed)
 Requested medications are due for refill today.  yes  Requested medications are on the active medications list.  yes  Last refill. 07/10/2023 #[id number] rf  Future visit scheduled.   yes  Notes to clinic.  Expired labs.    Requested Prescriptions  Pending Prescriptions Disp Refills   gabapentin  (NEURONTIN ) 300 MG capsule [Pharmacy Med Name: GABAPENTIN  300MG  CAPSULES] 270 capsule 0    Sig: TAKE 1 CAPSULE(300 MG) BY MOUTH THREE TIMES DAILY     Neurology: Anticonvulsants - gabapentin  Failed - 10/12/2023  4:07 PM      Failed - Cr in normal range and within 360 days    Creatinine, Ser  Date Value Ref Range Status  04/18/2022 0.83 0.57 - 1.00 mg/dL Final         Passed - Completed PHQ-2 or PHQ-9 in the last 360 days      Passed - Valid encounter within last 12 months    Recent Outpatient Visits           1 month ago Chronic pain syndrome   [facility] Augustus Ledger, Dessie Flow, MD   1 month ago Depression, major, single episode, moderate [facility])   [facility] at [facility], Dessie Flow, MD       Future Appointments             In 1 month Augustus Ledger, Dessie Flow, MD [facility] at [facility], [facility]

## 2023-10-18 ENCOUNTER — Ambulatory Visit (INDEPENDENT_AMBULATORY_CARE_PROVIDER_SITE_OTHER)

## 2023-10-18 VITALS — Ht 69.0 in | Wt 223.0 lb

## 2023-10-18 DIAGNOSIS — M722 Plantar fascial fibromatosis: Secondary | ICD-10-CM

## 2023-10-18 DIAGNOSIS — R234 Changes in skin texture: Secondary | ICD-10-CM | POA: Diagnosis not present

## 2023-10-19 ENCOUNTER — Ambulatory Visit: Admitting: Physical Therapy
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
## 2023-10-24 ENCOUNTER — Ambulatory Visit: Attending: Physician Assistant | Admitting: Physical Therapy

## 2023-10-24 ENCOUNTER — Encounter: Payer: Self-pay | Admitting: Physical Therapy

## 2023-10-24 DIAGNOSIS — M546 Pain in thoracic spine: Secondary | ICD-10-CM

## 2023-10-24 DIAGNOSIS — M5432 Sciatica, left side: Secondary | ICD-10-CM | POA: Diagnosis present

## 2023-10-24 DIAGNOSIS — M5431 Sciatica, right side: Secondary | ICD-10-CM | POA: Diagnosis present

## 2023-10-24 DIAGNOSIS — M5459 Other low back pain: Secondary | ICD-10-CM | POA: Diagnosis present
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
## 2023-10-26 ENCOUNTER — Ambulatory Visit

## 2023-10-26 ENCOUNTER — Encounter: Payer: Self-pay | Admitting: Physical Therapy

## 2023-10-26 DIAGNOSIS — M546 Pain in thoracic spine: Secondary | ICD-10-CM

## 2023-10-26 DIAGNOSIS — M5432 Sciatica, left side: Secondary | ICD-10-CM

## 2023-10-26 DIAGNOSIS — M5459 Other low back pain: Secondary | ICD-10-CM

## 2023-10-26 DIAGNOSIS — M5431 Sciatica, right side: Secondary | ICD-10-CM
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
## 2023-10-31 ENCOUNTER — Ambulatory Visit: Admitting: Physical Therapy

## 2023-10-31 ENCOUNTER — Other Ambulatory Visit: Payer: Self-pay | Admitting: Family Medicine

## 2023-10-31 DIAGNOSIS — G4701 Insomnia due to medical condition: Secondary | ICD-10-CM

## 2023-11-01 NOTE — Telephone Encounter (Signed)
 Requested by interface surescripts. Last refill documented 09/12/23. Too soon for refill. Future visit in 3 weeks. Requested Prescriptions  Refused Prescriptions Disp Refills   traZODone  (DESYREL ) 50 MG tablet [Pharmacy Med Name: TRAZODONE  50MG  TABLETS] 90 tablet 0    Sig: TAKE 2-4 TABLETS BY MOUTH AT BEDTIME AS NEEDED FOR SLEEP. USE LOWEST DOSE NECESSARY     Psychiatry: Antidepressants - Serotonin Modulator Passed - 11/01/2023  4:16 PM      Passed - Completed PHQ-2 or PHQ-9 in the last 360 days      Passed - Valid encounter within last 6 months    Recent Outpatient Visits           1 month ago Chronic pain syndrome   [facility], Dessie Flow, MD   2 months ago Depression, major, single episode, moderate [facility])   [facility] at [facility], Dessie Flow, MD       Future Appointments             In 3 weeks Augustus Ledger, Dessie Flow, MD [facility] at [facility], [facility]

## 2023-11-02 ENCOUNTER — Ambulatory Visit: Admitting: Physical Therapy

## 2023-11-06 ENCOUNTER — Ambulatory Visit: Admitting: Physical Therapy

## 2023-11-06 ENCOUNTER — Encounter: Payer: Self-pay | Admitting: Physical Therapy

## 2023-11-06 DIAGNOSIS — M546 Pain in thoracic spine: Secondary | ICD-10-CM

## 2023-11-06 DIAGNOSIS — M5431 Sciatica, right side: Secondary | ICD-10-CM | POA: Diagnosis not present

## 2023-11-06 DIAGNOSIS — M5432 Sciatica, left side: Secondary | ICD-10-CM

## 2023-11-06 DIAGNOSIS — M5459 Other low back pain: Secondary | ICD-10-CM
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
## 2023-11-08 ENCOUNTER — Ambulatory Visit: Admitting: Physical Therapy

## 2023-11-08 ENCOUNTER — Encounter: Payer: Self-pay | Admitting: Physical Therapy

## 2023-11-08 DIAGNOSIS — M5459 Other low back pain: Secondary | ICD-10-CM

## 2023-11-08 DIAGNOSIS — M5431 Sciatica, right side: Secondary | ICD-10-CM

## 2023-11-08 DIAGNOSIS — M546 Pain in thoracic spine: Secondary | ICD-10-CM

## 2023-11-08 DIAGNOSIS — M5432 Sciatica, left side: Secondary | ICD-10-CM
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
## 2023-11-13 ENCOUNTER — Telehealth: Payer: Self-pay | Admitting: Physician Assistant

## 2023-11-13 ENCOUNTER — Encounter: Payer: Self-pay | Admitting: Family Medicine

## 2023-11-13 NOTE — Telephone Encounter (Signed)
 Patient is aware of Brooke's response. She will contact her PCP to see if they can take over the disability. Last time they told her to get it from our office.

## 2023-11-13 NOTE — Telephone Encounter (Signed)
 FYI

## 2023-11-13 NOTE — Telephone Encounter (Signed)
 Patient is calling if you can extend her disability? She was discharged from PT last week on 6/18. We are going to resubmit her MRI request for approval from her insurance. She still feels that she is not able to do her job due to her back pain. Can you keep her out until she is able to have her MRI?

## 2023-11-15 ENCOUNTER — Encounter: Admitting: Physical Therapy

## 2023-11-20 ENCOUNTER — Encounter: Admitting: Physical Therapy

## 2023-11-22 ENCOUNTER — Encounter: Admitting: Physical Therapy

## 2023-11-22 ENCOUNTER — Ambulatory Visit
Admission: RE | Admit: 2023-11-22 | Discharge: 2023-11-22 | Disposition: A | Discharge status: Home / Self Care | Source: Ambulatory Visit | Attending: Physician Assistant | Admitting: Physician Assistant

## 2023-11-22 DIAGNOSIS — M5441 Lumbago with sciatica, right side: Secondary | ICD-10-CM | POA: Diagnosis present

## 2023-11-23 ENCOUNTER — Ambulatory Visit: Payer: Self-pay | Admitting: Physician Assistant

## 2023-11-23 ENCOUNTER — Telehealth: Admitting: Family Medicine

## 2023-11-23 ENCOUNTER — Encounter: Payer: Self-pay | Admitting: Family Medicine

## 2023-11-23 DIAGNOSIS — F321 Major depressive disorder, single episode, moderate: Secondary | ICD-10-CM | POA: Diagnosis not present

## 2023-11-23 DIAGNOSIS — G4701 Insomnia due to medical condition: Secondary | ICD-10-CM | POA: Diagnosis not present

## 2023-11-23 DIAGNOSIS — R198 Other specified symptoms and signs involving the digestive system and abdomen: Secondary | ICD-10-CM

## 2023-11-23 MED ORDER — CARIPRAZINE HCL 3 MG PO CAPS: 3.0000 mg | ORAL_CAPSULE | Freq: Every day | ORAL | 3 refills | Status: DC

## 2023-11-23 NOTE — Assessment & Plan Note (Signed)
 Psychological stress - High stress levels due to family conflicts, affecting emotional well-being - Stress managed with medications and therapy sessions with a behavioral therapist from [facility]  Depression Depression managed with Vraylar , citalopram , and behavioral therapy. Stress from family issues contributes to symptoms. - Increase Vraylar  to 3 mg daily. - Continue citalopram  as prescribed. - Continue behavioral therapy sessions with [facility]. - Monitor stress and mood symptoms, adjusting trazodone  as needed for sleep.

## 2023-11-23 NOTE — Patient Instructions (Signed)
 Patient Plan for Post-Visit Guidance  1. For Depression and Stress:    - Increase Vraylar  to 3 mg daily.    - Continue taking citalopram  as prescribed.    - Keep attending behavioral therapy sessions with [facility].    - Monitor your stress and mood symptoms, and adjust trazodone  as needed for sleep.  2. For Gastrointestinal Symptoms:    - Attend the scheduled colonoscopy on July 16 to evaluate for colitis.    - If symptoms persist after the colonoscopy, consider an upper endoscopy.    - Follow up with your surgeon for results and further management.  3. For Insomnia:    - Continue taking 2 tablets (20 mg) of amitriptyline  nightly.    - Keep using trazodone , with the option to increase to four tablets (200 mg total) on nights when sleep is difficult.    - Monitor your sleep quality and adjust medications as needed.  Red Flags: - If you experience worsening depression, increased stress, or any new or severe symptoms, contact your healthcare provider immediately.
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
## 2023-11-23 NOTE — Assessment & Plan Note (Signed)
 Gastrointestinal symptoms - Intermittent episodes of vomiting shortly after eating - Colonoscopy scheduled for further evaluation of gastrointestinal symptoms  Chronic abdominal pain Chronic abdominal pain with postprandial vomiting. Colonoscopy scheduled to investigate potential causes, including colitis.  This is being managed by her surgeon. - Undergo colonoscopy on July 16 to evaluate for colitis. - Consider upper endoscopy if symptoms persist post-colonoscopy. - Follow up with your surgeon for results and further management.

## 2023-11-23 NOTE — Assessment & Plan Note (Signed)
 Insomnia - Chronic difficulty with sleep, managed with amitriptyline  and trazodone  - Amitriptyline : two tablets nightly (20 mg), resulting in improved sleep most nights - Trazodone : three tablets (150 mg) nightly - Occasional poor sleep persists, attributed to stress from family issues  Insomnia Insomnia improved with amitriptyline , but stress affecting sleep quality. Discussed increasing amitriptyline  in the future with risks of grogginess or sluggishness. - Remain on current 2 tablets (20 mg) dose of amitriptyline  nightly. - Continue trazodone , with option to increase to four tablets on difficult sleep nights. - Monitor sleep quality and adjust medications as needed.

## 2023-11-29 ENCOUNTER — Encounter: Payer: Self-pay | Admitting: Family Medicine

## 2023-11-29 ENCOUNTER — Ambulatory Visit: Admitting: Physician Assistant

## 2023-11-29 ENCOUNTER — Telehealth: Payer: Self-pay | Admitting: Physician Assistant

## 2023-11-29 ENCOUNTER — Other Ambulatory Visit: Payer: Self-pay | Admitting: Physician Assistant

## 2023-11-29 DIAGNOSIS — M545 Low back pain, unspecified: Secondary | ICD-10-CM | POA: Diagnosis not present

## 2023-11-29 DIAGNOSIS — R202 Paresthesia of skin: Secondary | ICD-10-CM | POA: Diagnosis not present

## 2023-11-29 DIAGNOSIS — M5441 Lumbago with sciatica, right side: Secondary | ICD-10-CM

## 2023-11-29 DIAGNOSIS — R200 Anesthesia of skin: Secondary | ICD-10-CM

## 2023-11-29 DIAGNOSIS — M51362 Other intervertebral disc degeneration, lumbar region with discogenic back pain and lower extremity pain: Secondary | ICD-10-CM

## 2023-11-29 MED ORDER — CELECOXIB 200 MG PO CAPS: 200.0000 mg | ORAL_CAPSULE | Freq: Two times a day (BID) | ORAL | 1 refills | Status: DC
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
## 2023-11-29 NOTE — Progress Notes (Signed)
  Called patient today to discuss her continued pain and numbness as well as her recent MRI results.  She continues to have some aching in her low back especially if she is sitting for long time or has to reposition.  She continues to feel as though her pain radiates down to her feet on both sides.  She does not feel as though 1 leg is worse than the other.  She is taking trazodone , gabapentin  at night to sleep.  She sometimes uses the muscle relaxer.  Neuropathy pain is worse at night.  Plan moving forward includes above and:   -Patient has not taken B12 supplement for a while.  Would like labs to check B12 as well as her A1c. - Due to continued numbness and tingling that does not x-ray correlate with MRI, would like her to undergo EMG.  This has been ordered. - Previously tried meloxicam , but like to move forward with Celebrex  would help with her pain. - Pain referral also sent for potential injections.  EXAM DESCRIPTION: MR LUMBAR SPINE WO CONTRAST   CLINICAL HISTORY: Lumbar radiculopathy, symptoms persist with > 6 wks treatment   COMPARISON: None Available.   TECHNIQUE: MRI of the lumbar spine is performed according to our usual protocol with multiplanar multi sequence imaging.   FINDINGS: The alignment is unremarkable. Mild degenerative disc disease throughout. No endplate marrow edema. The marrow signal is unremarkable as well as the conus and cauda equina.   L3-4 has a small left paracentral protrusion causing mild-to-moderate lateral recess narrowing. Crowding of the left L4 nerve without definitive contact. Also mild degenerative left-sided foraminal narrowing.   L4-5 has mild foraminal narrowing on the left due to broad-based disc bulge and facet hypertrophy.   The image levels are otherwise unremarkable.   The imaged retroperitoneal structures are unremarkable.   IMPRESSION: Mild degenerative disc disease without endplate marrow edema.   L3-4 has a small left paracentral  protrusion causing mild-to-moderate lateral recess narrowing. No definitive contact or impingement of the left L4 nerve. Also mild degenerative left-sided foraminal narrowing at this level.   L4-5 has mild degenerative left-sided foraminal narrowing.  This visit was performed via telephone.  Patient location: home Provider location: office  I spent a total of 15 minutes non-face-to-face activities for this visit on the date of this encounter including review of current clinical condition and response to treatment.  The patient is aware of and accepts the limits of this telehealth visit.

## 2023-12-01 ENCOUNTER — Other Ambulatory Visit: Payer: Self-pay

## 2023-12-01 ENCOUNTER — Encounter: Payer: Self-pay | Admitting: Neurology

## 2023-12-01 DIAGNOSIS — R202 Paresthesia of skin: Secondary | ICD-10-CM

## 2023-12-01 NOTE — Telephone Encounter (Signed)
 Also she can not recall what you told her was at her L3-4. She is asking of what you told her could that be caused by age or accident?  What was your response on this question?

## 2023-12-01 NOTE — Telephone Encounter (Signed)
 Patient notified of both responses. She had no further questions.

## 2023-12-04 NOTE — Telephone Encounter (Signed)
 Please review and advise patient.   JM

## 2023-12-07 ENCOUNTER — Telehealth: Payer: Self-pay | Admitting: Physician Assistant

## 2023-12-07 ENCOUNTER — Encounter: Payer: Self-pay | Admitting: Family Medicine

## 2023-12-07 NOTE — Telephone Encounter (Signed)
 Patient needs a letter that she is cleared to return to work with no restrictions (must say no restrictions on the letter)  Pt also said it is OK to send this through MyChart

## 2023-12-07 NOTE — Telephone Encounter (Signed)
 Patient notified work note is complete.

## 2023-12-19 ENCOUNTER — Other Ambulatory Visit: Payer: Self-pay | Admitting: Family Medicine

## 2023-12-19 DIAGNOSIS — F321 Major depressive disorder, single episode, moderate: Secondary | ICD-10-CM

## 2023-12-20 ENCOUNTER — Ambulatory Visit: Admitting: Family Medicine

## 2023-12-20 NOTE — Telephone Encounter (Signed)
 Requested medication (s) are due for refill today:   Provider to review  Requested medication (s) are on the active medication list:   Yes  Future visit scheduled:   Yes 8/18   Had an appt for today 7/30 with Dr. Alvia but pt cancelled it.   Last ordered: 11/23/2023 #90, 3 refills   Looks like it's being requested too soon.  No protocol assigned to this mediation.   Requested Prescriptions  Pending Prescriptions Disp Refills   VRAYLAR  1.5 MG capsule [Pharmacy Med Name: VRAYLAR  1.5MG  CAPSULES] 90 capsule 0    Sig: TAKE 1 CAPSULE BY MOUTH DAILY     Off-Protocol Failed - 12/20/2023 11:24 AM      Failed - Medication not assigned to a protocol, review manually.      Passed - Valid encounter within last 12 months    Recent Outpatient Visits           3 weeks ago Insomnia due to medical condition   [place] [facility] at [facility], Selinda PARAS, MD   3 months ago Chronic pain syndrome   [facility] Medicine at [facility], Selinda PARAS, MD   3 months ago Depression, major, single episode, moderate [facility])   [facility] Primary Care & Sports Medicine at [facility], Jason J, MD

## 2023-12-29 ENCOUNTER — Encounter: Admitting: Neurology

## 2023-12-29 ENCOUNTER — Other Ambulatory Visit: Payer: Self-pay | Admitting: Family Medicine

## 2023-12-29 DIAGNOSIS — G4701 Insomnia due to medical condition: Secondary | ICD-10-CM

## 2024-01-02 NOTE — Telephone Encounter (Signed)
 Requested medication (s) are due for refill today: yes  Requested medication (s) are on the active medication list: yes  Last refill:  09/04/23  Future visit scheduled: no  Notes to clinic:  Unable to refill per protocol, last refill by another provider.      Requested Prescriptions  Pending Prescriptions Disp Refills   topiramate  (TOPAMAX ) 50 MG tablet [Pharmacy Med Name: TOPIRAMATE  50MG  TABLETS] 180 tablet     Sig: TAKE 1 TABLET(50 MG) BY MOUTH TWICE DAILY     Neurology: Anticonvulsants - topiramate  & zonisamide Failed - 01/02/2024  8:49 AM      Failed - Cr in normal range and within 360 days    Creatinine, Ser  Date Value Ref Range Status  04/18/2022 0.83 0.57 - 1.00 mg/dL Final         Failed - CO2 in normal range and within 360 days    CO2  Date Value Ref Range Status  04/18/2022 22 20 - 29 mmol/L Final         Failed - ALT in normal range and within 360 days    ALT  Date Value Ref Range Status  04/18/2022 37 (H) 0 - 32 IU/L Final         Failed - AST in normal range and within 360 days    AST  Date Value Ref Range Status  04/18/2022 35 0 - 40 IU/L Final         Passed - Completed PHQ-2 or PHQ-9 in the last 360 days      Passed - Valid encounter within last 12 months    Recent Outpatient Visits           1 month ago Insomnia due to medical condition   [place] [facility] at [facility], Selinda PARAS, MD   3 months ago Chronic pain syndrome   [facility] Primary Care & Sports Medicine at [facility], Selinda PARAS, MD   4 months ago Depression, major, single episode, moderate [facility])   [place] [facility] Care & Sports Medicine at [facility], Selinda PARAS, MD              Refused Prescriptions Disp Refills   amitriptyline  (ELAVIL ) 25 MG tablet [Pharmacy Med Name: AMITRIPTYLINE  25MG  TABLETS] 90 tablet 0    Sig: TAKE 1 TABLET(25 MG) BY MOUTH AT BEDTIME     Psychiatry:  Antidepressants - Heterocyclics  (TCAs) Passed - 01/02/2024  8:49 AM      Passed - Completed PHQ-2 or PHQ-9 in the last 360 days      Passed - Valid encounter within last 6 months    Recent Outpatient Visits           1 month ago Insomnia due to medical condition   [place] [facility] at [facility], Selinda PARAS, MD   3 months ago Chronic pain syndrome   [facility] Primary Care & Sports Medicine at [facility], Selinda PARAS, MD   4 months ago Depression, major, single episode, moderate [facility])   [facility] Primary Care & Sports Medicine at [facility], Jason J, MD

## 2024-01-02 NOTE — Telephone Encounter (Signed)
 Unable to refill per protocol, Rx expired. Discontinued 03/24/23, dose change.  Requested Prescriptions  Pending Prescriptions Disp Refills   topiramate  (TOPAMAX ) 50 MG tablet [Pharmacy Med Name: TOPIRAMATE  50MG  TABLETS] 180 tablet     Sig: TAKE 1 TABLET(50 MG) BY MOUTH TWICE DAILY     Neurology: Anticonvulsants - topiramate  & zonisamide Failed - 01/02/2024  8:48 AM      Failed - Cr in normal range and within 360 days    Creatinine, Ser  Date Value Ref Range Status  04/18/2022 0.83 0.57 - 1.00 mg/dL Final         Failed - CO2 in normal range and within 360 days    CO2  Date Value Ref Range Status  04/18/2022 22 20 - 29 mmol/L Final         Failed - ALT in normal range and within 360 days    ALT  Date Value Ref Range Status  04/18/2022 37 (H) 0 - 32 IU/L Final         Failed - AST in normal range and within 360 days    AST  Date Value Ref Range Status  04/18/2022 35 0 - 40 IU/L Final         Passed - Completed PHQ-2 or PHQ-9 in the last 360 days      Passed - Valid encounter within last 12 months    Recent Outpatient Visits           1 month ago Insomnia due to medical condition   [place] [facility] at [facility], Selinda PARAS, MD   3 months ago Chronic pain syndrome   [facility] Medicine at [facility], Selinda PARAS, MD   4 months ago Depression, major, single episode, moderate [facility])   [place] [facility] Care & Sports Medicine at [facility], Selinda PARAS, MD               amitriptyline  (ELAVIL ) 25 MG tablet [Pharmacy Med Name: AMITRIPTYLINE  25MG  TABLETS] 90 tablet 0    Sig: TAKE 1 TABLET(25 MG) BY MOUTH AT BEDTIME     Psychiatry:  Antidepressants - Heterocyclics (TCAs) Passed - 01/02/2024  8:48 AM      Passed - Completed PHQ-2 or PHQ-9 in the last 360 days      Passed - Valid encounter within last 6 months    Recent Outpatient Visits           1 month ago Insomnia due to medical  condition   [place] [facility], Selinda PARAS, MD   3 months ago Chronic pain syndrome   [facility] Primary Care & Sports Medicine at [facility], Selinda PARAS, MD   4 months ago Depression, major, single episode, moderate [facility])   [facility] Medicine at [facility], Jason J, MD

## 2024-01-03 ENCOUNTER — Ambulatory Visit (HOSPITAL_BASED_OUTPATIENT_CLINIC_OR_DEPARTMENT_OTHER): Payer: Self-pay | Admitting: Pain Medicine

## 2024-01-03 DIAGNOSIS — G8929 Other chronic pain: Secondary | ICD-10-CM

## 2024-01-03 DIAGNOSIS — Z91199 Patient's noncompliance with other medical treatment and regimen due to unspecified reason: Secondary | ICD-10-CM

## 2024-01-03 NOTE — Progress Notes (Signed)
 Department: [place] [facility]) Date: 01/03/2024  Event: NO SHOW.  Encounter Type: Patient-requested evaluation.          Advance notice: None  Reason: Unknown.          Significance: Unintended waste of community medical resources.

## 2024-01-08 ENCOUNTER — Encounter: Payer: Self-pay | Admitting: Family Medicine

## 2024-01-08 ENCOUNTER — Telehealth (INDEPENDENT_AMBULATORY_CARE_PROVIDER_SITE_OTHER): Payer: Self-pay | Admitting: Family Medicine

## 2024-01-08 DIAGNOSIS — R42 Dizziness and giddiness: Secondary | ICD-10-CM

## 2024-01-08 DIAGNOSIS — F321 Major depressive disorder, single episode, moderate: Secondary | ICD-10-CM

## 2024-01-08 DIAGNOSIS — G4701 Insomnia due to medical condition: Secondary | ICD-10-CM

## 2024-01-10 DIAGNOSIS — R42 Dizziness and giddiness: Secondary | ICD-10-CM | POA: Insufficient documentation

## 2024-01-10 NOTE — Assessment & Plan Note (Signed)
 Sleep disturbance - Difficulty maintaining sleep, with frequent awakenings during the night - No difficulty with sleep onset - Recent travel to [place]  disrupted sleep schedule due to time zone change - Wakes up in pain, especially when sleeping on her side, requiring position changes for comfort - Takes trazodone  for sleep, currently reduced to one tablet - Prescribed amitriptyline  for sleep  Insomnia Chronic insomnia potentially worsened by stress and travel, with current use of amitriptyline  and trazodone . - Consider increasing trazodone  dosage gradually. - Adjust trazodone  timing to improve sleep quality.
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
## 2024-01-10 NOTE — Assessment & Plan Note (Signed)
 Psychological stress and medication use - Prescribed Vraylar  and Celexa  for stress management - Engaged in therapy for stress management  Depression and anxiety Ongoing depression and anxiety managed with Vraylar  and Celexa , with stress affecting sleep. - Refer to psychiatry for medication evaluation. - Coordinate with care services for mental health resource access.

## 2024-01-10 NOTE — Patient Instructions (Signed)
 Patient Action Plan  1. For depression and anxiety:    - Continue taking Vraylar  and Celexa  as prescribed.    - Attend therapy sessions for stress management.    - Follow up with psychiatry for medication evaluation as referred.    - Use mental health resources as coordinated by care services.  2. For dizziness and fainting:    - Increase your fluid intake and use electrolyte drinks daily.    - If dizziness continues, consider adjusting your gabapentin  dose as discussed.    - If symptoms persist, try skipping a dose of gabapentin  to see if dizziness improves.  3. For insomnia:    - Continue taking trazodone  and amitriptyline  as prescribed.    - If sleep does not improve, consider gradually increasing your trazodone  dose.    - Adjust the timing of trazodone  to help improve sleep quality.    - Change sleeping positions as needed for comfort, especially if waking up in pain.  Red Flags: If you experience any of the following, seek medical attention right away: - Fainting without warning or frequent falls - Severe dizziness that does not improve with fluids - Chest pain, shortness of breath, or palpitations - Thoughts of self-harm or suicidal thoughts  If your symptoms get worse or you have new concerns, contact the office.

## 2024-01-23 NOTE — Progress Notes (Unsigned)
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 higher incidence of chronic pain  <18 kg/m2 Underweight   18.5-24.9 kg/m2 Ideal body weight   25-29.9 kg/m2 Overweight Increased incidence by 20%  30-34.9 kg/m2 Obese (Class I) Increased incidence by 68%  35-39.9 kg/m2 Severe obesity (Class II) Increased incidence by 136%  >40 kg/m2 Extreme obesity (Class III) Increased incidence by 254%   Patient's current BMI Ideal Body weight  There is no height or weight on file to calculate BMI. Patient weight not recorded   BMI Readings from Last 4 Encounters:  10/18/23 32.93 kg/m  09/04/23 32.93 kg/m  08/24/23 34.26 kg/m  06/08/23 39.46 kg/m   Wt Readings from Last 4 Encounters:  10/18/23 223 lb (101.2 kg)   09/04/23 223 lb (101.2 kg)  08/24/23 232 lb (105.2 kg)  06/08/23 267 lb 3.2 oz (121.2 kg)    Psych/Mental status: Alert, oriented x 3 (person, place, & time)       Eyes: PERLA Respiratory: No evidence of acute respiratory distress  Assessment  Primary Diagnosis & Pertinent Problem List: The primary encounter diagnosis was Chronic pain syndrome. Diagnoses of Chronic low back pain (1ry area of Pain) (Bilateral) (Midline) (R>L) w/o sciatica, Chronic lower extremity pain (2ry area of Pain) (Bilateral) (L>R), Chronic neck pain (3ry area of Pain) (Bilateral) (R>L), and Morbid obesity with BMI of 40.0-44.9, adult [facility]) were also pertinent to this visit.  Visit Diagnosis (New problems to examiner): 1. Chronic pain syndrome   2. Chronic low back pain (1ry area of Pain) (Bilateral) (Midline) (R>L) w/o sciatica   3. Chronic lower extremity pain (2ry area of Pain) (Bilateral) (L>R)   4. Chronic neck pain (3ry area of Pain) (Bilateral) (R>L)   5. Morbid obesity with BMI of 40.0-44.9, adult (HCC)    Plan of Care (Initial workup plan)  Note: Ms.  Alcoser was reminded that as per protocol, today's visit has been an evaluation only. We have not taken over the patient's controlled substance management.  Problem-specific plan: Assessment and Plan            Lab Orders  No laboratory test(s) ordered today   Imaging Orders  No imaging studies ordered today   Referral Orders  No referral(s) requested today   Procedure Orders    No procedure(s) ordered today   Pharmacotherapy (current): Medications ordered:  No orders of the defined types were placed in this encounter.  Medications administered during this visit: Deshunda Beechy had no medications administered during this visit.   Analgesic Pharmacotherapy:  Opioid Analgesics: For patients currently taking or requesting to take opioid analgesics, in accordance with [place]  Medical Board Guidelines, we will assess their risks and  indications for the use of these substances. After completing our evaluation, we may offer recommendations, but we no longer take patients for medication management. The prescribing physician will ultimately decide, based on his/her training and level of comfort whether to adopt any of the recommendations, including whether or not to prescribe such medicines.  Membrane stabilizer: To be determined at a later time  Muscle relaxant: To be determined at a later time  NSAID: To be determined at a later time  Other analgesic(s): To be determined at a later time   Interventional management options: Ms. Cavenaugh was informed that there is no guarantee that she would be a candidate for interventional therapies. The decision will be based on the results of diagnostic studies, as well as Ms. Bisher's risk profile.  Procedure(s) under consideration:  Pending results of ordered studies     Interventional Therapies  Risk Factors  Considerations  Medical Comorbidities:     Planned  Pending:      Under consideration:   Pending   Completed: (Analgesic benefit)1  None at this time   Therapeutic  Palliative (PRN) options:   None established   Completed by other providers:   None reported  1(Analgesic benefit): Expressed in percentage (%). (Local anesthetic[LA] +/- sedation  L.A.Local Anesthetic  Steroid benefit  Ongoing benefit)   Provider-requested follow-up: No follow-ups on file.  Future Appointments  Date Time Provider Department Center  01/24/2024 11:00 AM Tanya Glisson, MD [facility] None  02/15/2024 12:45 PM Patel, Donika K, DO [facility] None   I discussed the assessment and treatment plan with the patient. The patient was provided an opportunity to ask questions and all were answered. The patient agreed with the plan and demonstrated an understanding of the instructions.  Patient advised to call back or seek an in-person evaluation if the symptoms or condition  worsens.  Duration of encounter: *** minutes.  Total time on encounter, as per AMA guidelines included both the face-to-face and non-face-to-face time personally spent by the physician and/or other qualified health care professional(s) on the day of the encounter (includes time in activities that require the physician or other qualified health care professional and does not include time in activities normally performed by clinical staff). Physician's time may include the following activities when performed: Preparing to see the patient (e.g., pre-charting review of records, searching for previously ordered imaging, lab work, and nerve conduction tests) Review of prior analgesic pharmacotherapies. Reviewing PMP Interpreting ordered tests (e.g., lab work, imaging, nerve conduction tests) Performing post-procedure evaluations, including interpretation of diagnostic procedures Obtaining and/or reviewing separately obtained history Performing a medically appropriate examination and/or evaluation Counseling and educating the patient/family/caregiver Ordering  medications, tests, or procedures Referring and communicating with other health care professionals (when not separately reported) Documenting clinical information in the electronic or other health record Independently interpreting results (not separately reported) and communicating results to the patient/ family/caregiver Care coordination (not separately reported)  Note by: Eric DELENA Como, MD (TTS and AI technology used. I apologize for any typographical errors that were not detected and corrected.) Date: 01/24/2024; Time: 5:34 PM

## 2024-01-23 NOTE — Patient Instructions (Signed)

## 2024-01-24 ENCOUNTER — Encounter: Payer: Self-pay | Admitting: Family Medicine

## 2024-01-24 ENCOUNTER — Ambulatory Visit (HOSPITAL_BASED_OUTPATIENT_CLINIC_OR_DEPARTMENT_OTHER): Payer: Self-pay | Admitting: Pain Medicine

## 2024-01-24 ENCOUNTER — Other Ambulatory Visit: Payer: Self-pay | Admitting: Family Medicine

## 2024-01-24 DIAGNOSIS — M5441 Lumbago with sciatica, right side: Secondary | ICD-10-CM | POA: Insufficient documentation

## 2024-01-24 DIAGNOSIS — F418 Other specified anxiety disorders: Secondary | ICD-10-CM

## 2024-01-24 DIAGNOSIS — G8929 Other chronic pain: Secondary | ICD-10-CM

## 2024-01-24 DIAGNOSIS — F321 Major depressive disorder, single episode, moderate: Secondary | ICD-10-CM

## 2024-01-24 DIAGNOSIS — R937 Abnormal findings on diagnostic imaging of other parts of musculoskeletal system: Secondary | ICD-10-CM | POA: Insufficient documentation

## 2024-01-24 DIAGNOSIS — E559 Vitamin D deficiency, unspecified: Secondary | ICD-10-CM | POA: Insufficient documentation

## 2024-01-24 DIAGNOSIS — Z91199 Patient's noncompliance with other medical treatment and regimen due to unspecified reason: Secondary | ICD-10-CM

## 2024-01-24 DIAGNOSIS — G894 Chronic pain syndrome: Secondary | ICD-10-CM

## 2024-01-24 NOTE — Telephone Encounter (Signed)
 Please review. Unsure what patient is talking about.  JM

## 2024-01-29 ENCOUNTER — Encounter: Payer: Self-pay | Admitting: *Deleted

## 2024-02-01 NOTE — Telephone Encounter (Signed)
 Do you know the answer to this?  Jm

## 2024-02-02 DIAGNOSIS — Z419 Encounter for procedure for purposes other than remedying health state, unspecified: Secondary | ICD-10-CM | POA: Diagnosis not present

## 2024-02-03 ENCOUNTER — Other Ambulatory Visit: Payer: Self-pay | Admitting: Family Medicine

## 2024-02-03 DIAGNOSIS — F418 Other specified anxiety disorders: Secondary | ICD-10-CM

## 2024-02-03 DIAGNOSIS — M47817 Spondylosis without myelopathy or radiculopathy, lumbosacral region: Secondary | ICD-10-CM

## 2024-02-05 NOTE — Telephone Encounter (Signed)
 Requested Prescriptions  Pending Prescriptions Disp Refills   citalopram  (CELEXA ) 40 MG tablet [Pharmacy Med Name: CITALOPRAM  40MG  TABLETS] 30 tablet 3    Sig: TAKE 1 TABLET(40 MG) BY MOUTH DAILY     Psychiatry:  Antidepressants - SSRI Passed - 02/05/2024  2:03 PM      Passed - Completed PHQ-2 or PHQ-9 in the last 360 days      Passed - Valid encounter within last 6 months    Recent Outpatient Visits           4 weeks ago Insomnia due to medical condition   [place] [facility], Selinda PARAS, MD   2 months ago Insomnia due to medical condition   [facility], Selinda PARAS, MD   5 months ago Chronic pain syndrome   [facility], Selinda PARAS, MD   5 months ago Depression, major, single episode, moderate (HCC)   [place] [facility] at [facility], Selinda PARAS, MD               gabapentin  (NEURONTIN ) 300 MG capsule [Pharmacy Med Name: GABAPENTIN  300MG  CAPSULES] 270 capsule 0    Sig: TAKE 1 CAPSULE(300 MG) BY MOUTH THREE TIMES DAILY     Neurology: Anticonvulsants - gabapentin  Failed - 02/05/2024  2:03 PM      Failed - Cr in normal range and within 360 days    Creatinine, Ser  Date Value Ref Range Status  04/18/2022 0.83 0.57 - 1.00 mg/dL Final         Passed - Completed PHQ-2 or PHQ-9 in the last 360 days      Passed - Valid encounter within last 12 months    Recent Outpatient Visits           4 weeks ago Insomnia due to medical condition   [place] [facility], Selinda PARAS, MD   2 months ago Insomnia due to medical condition   [facility], Selinda PARAS, MD   5 months ago Chronic pain syndrome   [facility], Selinda PARAS, MD   5  months ago Depression, major, single episode, moderate [facility])   [facility] at [facility], Selinda PARAS, MD

## 2024-02-06 ENCOUNTER — Encounter: Payer: Self-pay | Admitting: *Deleted

## 2024-02-06 ENCOUNTER — Other Ambulatory Visit: Payer: Self-pay | Admitting: *Deleted

## 2024-02-06 NOTE — Patient Outreach (Signed)
 Provider referral received to assist with mental health services as well as assistance with insurance coverage. Patient confirmed that she has recently been approved for Medicaid which will allow her to follow up with a psychiatrist. Patient referred to [facility]. CCM program offered/discussed, however patient declined as the main issue was help needed with insurance coverage which she now has.  CSW will follow up with patient on 02/07/24 regarding referral for psychiatric services.   Macee Venables, LCSW [place]  [facility], [facility] Licensed Clinical Social Worker  Direct Dial: [phone]

## 2024-02-06 NOTE — Progress Notes (Unsigned)
 This encounter was created in error - please disregard.

## 2024-02-08 ENCOUNTER — Encounter: Payer: Self-pay | Admitting: *Deleted

## 2024-02-08 NOTE — Patient Outreach (Signed)
 Phone call to [facility], confirmed that they have patients referral. Patient to call to schedule initial appointment. Patient notified and given contact information to call to schedule..Patient agreeable to calling to set up the initial appointment.  Latisha Lasch, LCSW [place]  [facility], [facility] Licensed Clinical Social Worker  Direct Dial: [phone]

## 2024-02-11 ENCOUNTER — Encounter: Payer: Self-pay | Admitting: Family Medicine

## 2024-02-12 ENCOUNTER — Other Ambulatory Visit: Payer: Self-pay

## 2024-02-12 DIAGNOSIS — K219 Gastro-esophageal reflux disease without esophagitis: Secondary | ICD-10-CM

## 2024-02-12 MED ORDER — PANTOPRAZOLE SODIUM 40 MG PO TBEC: 40.0000 mg | DELAYED_RELEASE_TABLET | Freq: Every evening | ORAL | 3 refills | Status: AC

## 2024-02-15 ENCOUNTER — Encounter: Admitting: Neurology

## 2024-02-16 ENCOUNTER — Encounter: Payer: Self-pay | Admitting: Family Medicine

## 2024-02-21 ENCOUNTER — Encounter: Payer: Self-pay | Admitting: Family Medicine

## 2024-02-21 ENCOUNTER — Ambulatory Visit (INDEPENDENT_AMBULATORY_CARE_PROVIDER_SITE_OTHER): Admitting: Family Medicine

## 2024-02-21 VITALS — BP 94/60 | HR 95 | Ht 69.0 in | Wt 182.6 lb

## 2024-02-21 DIAGNOSIS — R42 Dizziness and giddiness: Secondary | ICD-10-CM

## 2024-02-21 DIAGNOSIS — R198 Other specified symptoms and signs involving the digestive system and abdomen: Secondary | ICD-10-CM

## 2024-02-21 MED ORDER — ONDANSETRON 4 MG PO TBDP: 4.0000 mg | ORAL_TABLET | Freq: Four times a day (QID) | ORAL | 0 refills | Status: DC | PRN

## 2024-02-21 NOTE — Assessment & Plan Note (Signed)
 History of Present Illness Jamie Reeves is a 53 year old female who presents with persistent nausea and vomiting post-surgery.  Postoperative nausea and vomiting - Persistent nausea and vomiting since surgery - Vomiting occurs after eating, regardless of food type or quantity - Episodes of vomiting in public settings, including a recent incident in a store parking lot - No clear correlation between amount of food consumed and vomiting episodes - Nausea worsens after eating and leads to desire to lie down, which exacerbates symptoms  Gastrointestinal symptoms - Bowel movements are soft and occur at least once daily, sometimes more frequently - Episodes of diarrhea with variability in bowel movement frequency, including occasional days without bowel movements - No regular bowel regimen - Occasional use of Pepto for nausea and pain  Physical Exam MEASUREMENTS: Weight- 182. ABDOMEN: Abdomen is tympanic with well-healed scars. No abdominal masses are palpable. Tenderness is present in the right lower quadrant, with the rest of the abdomen being non-tender.  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 Possible post-surgical anatomical changes affecting gastric emptying and bowel movements. Tympanic abdomen suggests gas build-up, possibly due to bowel obstruction or altered motility. - Initiate bowel regimen with fiber supplement and Miralax as needed. - Encourage increased water  intake (at least 2 liters daily) and physical activity. - Prescribe Zofran  for nausea. - Consider simethicone for gas relief. - Advise follow-up with surgeon to assess for surgical complications.

## 2024-02-21 NOTE — Progress Notes (Signed)
 Primary Care / Sports Medicine Office Visit  Patient Information:  Patient ID: Jamie Reeves, female DOB: November 01, 1970 Age: 53 y.o. MRN: [id number]   Jamie Reeves is a pleasant 53 y.o. female presenting with the following:  Chief Complaint  Patient presents with   Dizziness    Patient continues to have dizzy spells when going from a sit to stand position and starting to walk from the sit to stand position.     Vitals:   02/21/24 0958  BP: 94/60  Pulse: 95  SpO2: 97%   Vitals:   02/21/24 0958  Weight: 182 lb 9.6 oz (82.8 kg)  Height: 5' 9 (1.753 m)   Body mass index is 26.97 kg/m.  No results found.   Discussed the use of AI scribe software for clinical note transcription with the patient, who gave verbal consent to proceed.   Independent interpretation of notes and tests performed by another provider:   None  Procedures performed:   None  Pertinent History, Exam, Impression, and Recommendations:   Problem List Items Addressed This Visit     Dizziness   Dizziness - Dizziness occurs particularly at night after taking medications and when sleepy - Concern regarding the impact of gabapentin , tizanidine , trazodone , and amitriptyline  on dizziness - Weight decreased from 232 pounds in April to 182 pounds currently  Dizziness after significant weight loss Dizziness likely related to significant weight loss and possible dehydration. Positional changes exacerbate symptoms. Potential contribution from medication effects due to altered body weight. - Encourage adequate hydration with water  and electrolyte solutions. - Advise on slow positional changes. - Discuss with surgeon for further evaluation.  Medication side effects (sedation, grogginess) Sedation and grogginess potentially due to current medication regimen. Weight loss may have altered medication effects, increasing sedation. - Conduct trial of reducing or skipping one medication at a time to identify  the primary cause of sedation. - Schedule a follow-up video visit in one month to assess medication effects and adjust regimen.      Gastrointestinal symptoms - Primary   History of Present Illness Jamie Reeves is a 53 year old female who presents with persistent nausea and vomiting post-surgery.  Postoperative nausea and vomiting - Persistent nausea and vomiting since surgery - Vomiting occurs after eating, regardless of food type or quantity - Episodes of vomiting in public settings, including a recent incident in a store parking lot - No clear correlation between amount of food consumed and vomiting episodes - Nausea worsens after eating and leads to desire to lie down, which exacerbates symptoms  Gastrointestinal symptoms - Bowel movements are soft and occur at least once daily, sometimes more frequently - Episodes of diarrhea with variability in bowel movement frequency, including occasional days without bowel movements - No regular bowel regimen - Occasional use of Pepto for nausea and pain  Physical Exam MEASUREMENTS: Weight- 182. ABDOMEN: Abdomen is tympanic with well-healed scars. No abdominal masses are palpable. Tenderness is present in the right lower quadrant, with the rest of the abdomen being non-tender.  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 Possible post-surgical anatomical changes affecting gastric emptying and bowel movements. Tympanic abdomen suggests gas build-up, possibly due to bowel obstruction or altered motility. - Initiate bowel regimen with fiber supplement and Miralax as needed. - Encourage increased water  intake (at least 2 liters daily) and physical activity. - Prescribe Zofran  for nausea. - Consider simethicone  for gas relief. - Advise follow-up with surgeon to assess for surgical complications.        Orders &  Medications Medications:  Meds ordered this encounter  Medications   ondansetron  (ZOFRAN -ODT) 4 MG disintegrating tablet    Sig: Take 1 tablet (4 mg total) by mouth every 6 (six) hours as needed.    Dispense:  30 tablet    Refill:  0   No orders of the defined types were placed in this encounter.    Return in about 4 weeks (around 03/20/2024) for MyChart Video Visit.     Selinda JINNY Ku, MD, [facility]   Primary Care Sports Medicine [facility] at [facility]

## 2024-02-21 NOTE — Assessment & Plan Note (Signed)
 Dizziness - Dizziness occurs particularly at night after taking medications and when sleepy - Concern regarding the impact of gabapentin , tizanidine , trazodone , and amitriptyline  on dizziness - Weight decreased from 232 pounds in April to 182 pounds currently  Dizziness after significant weight loss Dizziness likely related to significant weight loss and possible dehydration. Positional changes exacerbate symptoms. Potential contribution from medication effects due to altered body weight. - Encourage adequate hydration with water  and electrolyte solutions. - Advise on slow positional changes. - Discuss with surgeon for further evaluation.  Medication side effects (sedation, grogginess) Sedation and grogginess potentially due to current medication regimen. Weight loss may have altered medication effects, increasing sedation. - Conduct trial of reducing or skipping one medication at a time to identify the primary cause of sedation. - Schedule a follow-up video visit in one month to assess medication effects and adjust regimen.

## 2024-02-21 NOTE — Patient Instructions (Signed)
 VISIT SUMMARY:  During your visit, we discussed your persistent nausea and vomiting following surgery and dizziness. We have developed a plan to address these concerns and improve your overall well-being.  YOUR PLAN:  NAUSEA AND VOMITING WITH ALTERED BOWEL HABITS: You have been experiencing persistent nausea and vomiting since your surgery, along with changes in your bowel habits. -Start a bowel regimen with a fiber supplement and Miralax as needed. -Increase your water  intake to at least 2 liters daily and engage in physical activity. -Take Zofran  as prescribed for nausea. -Consider using simethicone for gas relief. -Follow up with your surgeon to check for any surgical complications.  DIZZINESS: You have been feeling dizzy, especially at night, which may be related to your significant weight loss and possible dehydration. -Ensure you are drinking enough water  and electrolyte solutions. -Make slow positional changes to help manage dizziness. -Discuss your symptoms with your surgeon for further evaluation.  MEDICATION SIDE EFFECTS (SEDATION, GROGGINESS): Your current medications may be causing sedation and grogginess, which could be worsened by your recent weight loss. -Try reducing or skipping one medication at a time to identify which one is causing the sedation. -We will have a follow-up video visit in one month to review the effects and adjust your medication regimen as needed.

## 2024-02-22 ENCOUNTER — Encounter: Payer: Self-pay | Admitting: Family Medicine

## 2024-02-23 NOTE — Telephone Encounter (Signed)
 I don't see where you have prescribed this medication to patient before but I figured I would let you decide if you wanted to refill.   JM

## 2024-02-26 NOTE — Telephone Encounter (Signed)
 Please review.  KP

## 2024-02-27 ENCOUNTER — Ambulatory Visit (INDEPENDENT_AMBULATORY_CARE_PROVIDER_SITE_OTHER): Admitting: Psychiatry

## 2024-02-27 ENCOUNTER — Encounter: Payer: Self-pay | Admitting: Psychiatry

## 2024-02-27 ENCOUNTER — Other Ambulatory Visit: Payer: Self-pay

## 2024-02-27 VITALS — BP 106/74 | HR 106 | Temp 97.2°F | Ht 69.0 in | Wt 181.0 lb

## 2024-02-27 DIAGNOSIS — F411 Generalized anxiety disorder: Secondary | ICD-10-CM

## 2024-02-27 DIAGNOSIS — F331 Major depressive disorder, recurrent, moderate: Secondary | ICD-10-CM

## 2024-02-27 DIAGNOSIS — F5105 Insomnia due to other mental disorder: Secondary | ICD-10-CM

## 2024-02-27 MED ORDER — CITALOPRAM HYDROBROMIDE 20 MG PO TABS: 20.0000 mg | ORAL_TABLET | Freq: Every day | ORAL | 0 refills | Status: DC

## 2024-02-27 MED ORDER — CARIPRAZINE HCL 1.5 MG PO CAPS: 1.5000 mg | ORAL_CAPSULE | Freq: Every day | ORAL | 2 refills | Status: DC

## 2024-02-27 MED ORDER — VORTIOXETINE HBR 5 MG PO TABS: 5.0000 mg | ORAL_TABLET | Freq: Every day | ORAL | 0 refills | Status: DC
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
## 2024-02-28 ENCOUNTER — Other Ambulatory Visit: Payer: Self-pay

## 2024-02-28 DIAGNOSIS — R112 Nausea with vomiting, unspecified: Secondary | ICD-10-CM | POA: Diagnosis not present

## 2024-02-28 DIAGNOSIS — Z6826 Body mass index (BMI) 26.0-26.9, adult: Secondary | ICD-10-CM | POA: Diagnosis not present

## 2024-02-28 DIAGNOSIS — Z9884 Bariatric surgery status: Secondary | ICD-10-CM | POA: Diagnosis not present

## 2024-02-28 MED ORDER — OXYBUTYNIN CHLORIDE ER 10 MG PO TB24: 10.0000 mg | ORAL_TABLET | Freq: Every day | ORAL | 0 refills | Status: DC

## 2024-02-28 NOTE — Telephone Encounter (Signed)
 PT response.  JM

## 2024-02-28 NOTE — Telephone Encounter (Signed)
 Refill sent ? ?KP ?

## 2024-03-03 DIAGNOSIS — Z419 Encounter for procedure for purposes other than remedying health state, unspecified: Secondary | ICD-10-CM | POA: Diagnosis not present

## 2024-03-04 ENCOUNTER — Telehealth: Payer: Self-pay

## 2024-03-04 NOTE — Telephone Encounter (Signed)
 Prior Authorization submitted for the patients Trintellix 5 mg tablets via CoverMyMeds sent to patients insurance  Approved 30 tablets per 30 days  Coverage 02-29-24///02-28-25  Called patient to make aware no answer

## 2024-03-07 ENCOUNTER — Other Ambulatory Visit: Payer: Self-pay | Admitting: Podiatry

## 2024-03-08 ENCOUNTER — Ambulatory Visit: Admitting: Neurology

## 2024-03-08 DIAGNOSIS — R202 Paresthesia of skin: Secondary | ICD-10-CM

## 2024-03-08 NOTE — Procedures (Signed)
 [facility] Neurology  [address] [place], [address]  [place], [place] [address] Tel: [phone] Fax: [phone] Test Date:  03/08/2024  Patient: Jamie Reeves DOB: August 29, 1970 Physician: Tonita Blanch, DO  Sex: Female Height: 5' 9 Ref Phys: Lyle Ulis RIGGERS  ID#: [id number]   Technician:    History: This is a 53 year old female referred for evaluation of bilateral leg paresthesias and numbness.  NCV & EMG Findings: Electrodiagnostic testing of the right lower extremity and additional studies of the left shows: Bilateral sural and superficial peroneal sensory responses are within normal limits. Bilateral peroneal and tibial motor responses are within normal limits. Bilateral tibial H reflex studies are within normal limits. There is no evidence of active or chronic motor axonal changes affecting any of the tested muscles.  Motor unit configuration and recruitment pattern is within normal limits.  Impression: This is a normal study of the lower extremities.  In particular, there is no evidence of a large fiber sensorimotor polyneuropathy or lumbosacral radiculopathy.    ___________________________ Tonita Blanch, DO    Nerve Conduction Studies   Stim Site NR Peak (ms) Norm Peak (ms) O-P Amp (V) Norm O-P Amp  Left Sup Peroneal Anti Sensory (Ant Lat Mall)  32 C  12 cm    2.7 <4.6 8.0 >4  Right Sup Peroneal Anti Sensory (Ant Lat Mall)  32 C  12 cm    2.7 <4.6 8.2 >4  Left Sural Anti Sensory (Lat Mall)  32 C  Calf    3.0 <4.6 14.2 >4  Right Sural Anti Sensory (Lat Mall)  32 C  Calf    2.9 <4.6 10.1 >4     Stim Site NR Onset (ms) Norm Onset (ms) O-P Amp (mV) Norm O-P Amp Site1 Site2 Delta-0 (ms) Dist (cm) Vel (m/s) Norm Vel (m/s)  Left Peroneal Motor (Ext Dig Brev)  32 C  Ankle    4.8 <6.0 4.7 >2.5 B Fib Ankle 8.4 36.0 43 >40  B Fib    13.2  4.7  Poplt B Fib 1.6 8.0 50 >40  Poplt    14.8  4.6         Right Peroneal Motor (Ext Dig Brev)  32 C  Ankle    3.9  <6.0 4.8 >2.5 B Fib Ankle 8.5 41.0 48 >40  B Fib    12.4  4.3  Poplt B Fib 1.8 8.0 44 >40  Poplt    14.2  4.3         Left Tibial Motor (Abd Hall Brev)  32 C  Ankle    5.2 <6.0 13.0 >4 Knee Ankle 9.9 42.0 42 >40  Knee    15.1  11.9         Right Tibial Motor (Abd Hall Brev)  32 C  Ankle    4.3 <6.0 12.8 >4 Knee Ankle 9.8 42.0 43 >40  Knee    14.1  8.0          Electromyography   Side Muscle Ins.Act Fibs Fasc Recrt Amp Dur Poly Activation Comment  Right AntTibialis Nml Nml Nml Nml Nml Nml Nml Nml N/A  Right Gastroc Nml Nml Nml Nml Nml Nml Nml Nml N/A  Right Flex Dig Long Nml Nml Nml Nml Nml Nml Nml Nml N/A  Right RectFemoris Nml Nml Nml Nml Nml Nml Nml Nml N/A  Right GluteusMed Nml Nml Nml Nml Nml Nml Nml Nml N/A  Left AntTibialis Nml Nml Nml Nml Nml Nml Nml Nml N/A  Left Gastroc Nml Nml Nml Nml Nml Nml Nml Nml N/A  Left Flex Dig Long Nml Nml Nml Nml Nml Nml Nml Nml N/A  Left RectFemoris Nml Nml Nml Nml Nml Nml Nml Nml N/A  Left GluteusMed Nml Nml Nml Nml Nml Nml Nml Nml N/A      Waveforms:

## 2024-03-12 ENCOUNTER — Encounter: Payer: Self-pay | Admitting: Family Medicine

## 2024-03-13 ENCOUNTER — Other Ambulatory Visit: Payer: Self-pay

## 2024-03-13 ENCOUNTER — Ambulatory Visit (INDEPENDENT_AMBULATORY_CARE_PROVIDER_SITE_OTHER): Admitting: Psychiatry

## 2024-03-13 ENCOUNTER — Encounter: Payer: Self-pay | Admitting: Psychiatry

## 2024-03-13 DIAGNOSIS — G894 Chronic pain syndrome: Secondary | ICD-10-CM

## 2024-03-13 DIAGNOSIS — F331 Major depressive disorder, recurrent, moderate: Secondary | ICD-10-CM | POA: Diagnosis not present

## 2024-03-13 DIAGNOSIS — F321 Major depressive disorder, single episode, moderate: Secondary | ICD-10-CM

## 2024-03-13 MED ORDER — VORTIOXETINE HBR 10 MG PO TABS: 10.0000 mg | ORAL_TABLET | Freq: Every day | ORAL | 0 refills | Status: DC

## 2024-03-13 MED ORDER — AMITRIPTYLINE HCL 10 MG PO TABS: 10.0000 mg | ORAL_TABLET | Freq: Every day | ORAL | 0 refills | Status: DC

## 2024-03-13 MED ORDER — CARIPRAZINE HCL 1.5 MG PO CAPS: 1.5000 mg | ORAL_CAPSULE | Freq: Every day | ORAL | 2 refills | Status: AC
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
## 2024-03-18 DIAGNOSIS — R1013 Epigastric pain: Secondary | ICD-10-CM | POA: Diagnosis not present

## 2024-03-18 DIAGNOSIS — Z9884 Bariatric surgery status: Secondary | ICD-10-CM | POA: Diagnosis not present

## 2024-03-18 DIAGNOSIS — R1110 Vomiting, unspecified: Secondary | ICD-10-CM | POA: Diagnosis not present

## 2024-03-18 DIAGNOSIS — R1084 Generalized abdominal pain: Secondary | ICD-10-CM | POA: Diagnosis not present

## 2024-03-18 DIAGNOSIS — K3189 Other diseases of stomach and duodenum: Secondary | ICD-10-CM | POA: Diagnosis not present

## 2024-03-21 ENCOUNTER — Telehealth: Admitting: Family Medicine

## 2024-03-21 DIAGNOSIS — R198 Other specified symptoms and signs involving the digestive system and abdomen: Secondary | ICD-10-CM | POA: Diagnosis not present

## 2024-03-21 DIAGNOSIS — R42 Dizziness and giddiness: Secondary | ICD-10-CM

## 2024-03-21 DIAGNOSIS — G4701 Insomnia due to medical condition: Secondary | ICD-10-CM

## 2024-03-21 NOTE — Assessment & Plan Note (Signed)
 Sleep disturbance - Sleep disturbances present. - Discontinued trazodone , initially prescribed for sleep, due to dizziness. - Currently taking amitriptyline  for sleep, which helps her fall asleep and stay asleep until around 4 or 5 AM. - Currently taking two tablets (20 mg) of amitriptyline ; previously tried three tablets (30 mg), but this caused excessive drowsiness when combined with trazodone . - Recent poor sleep attributed to staying at her mother's house to assist her grandmother, who was recently hospitalized. - Recently refilled prescription for amitriptyline .  Insomnia Insomnia managed with amitriptyline , effective for sleep onset and maintenance. - Continue amitriptyline , adjust dose as needed based on sedation and timing.
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
## 2024-03-21 NOTE — Patient Instructions (Signed)
 VISIT SUMMARY:  Today, we addressed your dizziness, gastrointestinal symptoms, and sleep disturbances. We discussed the resolution of your dizziness after stopping trazodone , ongoing nausea and vomiting, and your current sleep management with amitriptyline .  YOUR PLAN:  GASTROINTESTINAL SYMPTOMS:  -Keep your scheduled follow-ups with the surgical group.  NAUSEA AND VOMITING: You are experiencing intermittent nausea and vomiting. -Continue taking Zofran  as needed for nausea relief.  INSOMNIA: You have sleep disturbances that are currently managed with amitriptyline . -Continue taking amitriptyline . Adjust the dose if needed based on how drowsy it makes you and the timing of your sleep. -Make sure you have an adequate supply of amitriptyline .  FOLLOW-UP: We need to monitor your progress and recovery. -Focus on your upcoming esophageal testing and surgery. -Schedule a follow-up physical examination after your surgery recovery, potentially in December or January. -Contact us  if any issues arise before your scheduled follow-up.

## 2024-03-21 NOTE — Assessment & Plan Note (Signed)
 Dizziness - Dizziness began on October 1st after starting trazodone . - Dizziness resolved after discontinuing trazodone . - No recurrence of dizziness since stopping trazodone .

## 2024-03-21 NOTE — Assessment & Plan Note (Signed)
 Gastrointestinal symptoms - Intermittent nausea and vomiting present. - Vomited twice in the last few days. - Endoscopic procedure revealed an issue with the esophageal flap, possibly related to symptoms. - Experienced severe indigestion last night, which had not occurred in a long time. - Zofran  available for nausea.  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 - Continue with planned follow-up with surgical group.  Nausea and vomiting Intermittent nausea and vomiting. - Continue Zofran  as needed for symptomatic relief.

## 2024-03-25 ENCOUNTER — Other Ambulatory Visit: Payer: Self-pay | Admitting: Family Medicine

## 2024-03-25 DIAGNOSIS — G894 Chronic pain syndrome: Secondary | ICD-10-CM

## 2024-03-25 DIAGNOSIS — F321 Major depressive disorder, single episode, moderate: Secondary | ICD-10-CM

## 2024-03-27 ENCOUNTER — Encounter: Payer: Self-pay | Admitting: Psychiatry

## 2024-03-27 ENCOUNTER — Other Ambulatory Visit: Payer: Self-pay

## 2024-03-27 ENCOUNTER — Ambulatory Visit (INDEPENDENT_AMBULATORY_CARE_PROVIDER_SITE_OTHER): Admitting: Psychiatry

## 2024-03-27 VITALS — BP 120/81 | HR 118 | Temp 97.6°F | Ht 69.0 in | Wt 173.8 lb

## 2024-03-27 DIAGNOSIS — F411 Generalized anxiety disorder: Secondary | ICD-10-CM

## 2024-03-27 DIAGNOSIS — F5105 Insomnia due to other mental disorder: Secondary | ICD-10-CM | POA: Diagnosis not present

## 2024-03-27 DIAGNOSIS — F331 Major depressive disorder, recurrent, moderate: Secondary | ICD-10-CM

## 2024-03-27 MED ORDER — VORTIOXETINE HBR 10 MG PO TABS: 10.0000 mg | ORAL_TABLET | Freq: Every day | ORAL | 2 refills | Status: AC
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
## 2024-03-27 NOTE — Telephone Encounter (Signed)
 Duplicate request, refilled 03/13/24.  Requested Prescriptions  Pending Prescriptions Disp Refills   amitriptyline  (ELAVIL ) 10 MG tablet [Pharmacy Med Name: AMITRIPTYLINE  10MG  TABLETS] 90 tablet 0    Sig: TAKE 1 TABLET BY MOUTH AT BEDTIME. CAN INCREASE BY 1 TABLET EVERY 2 WEEKS OR MORE, DO NOT EXCEED 3 TABLETS NIGHTLY     Psychiatry:  Antidepressants - Heterocyclics (TCAs) Passed - 03/27/2024 10:30 AM      Passed - Completed PHQ-2 or PHQ-9 in the last 360 days      Passed - Valid encounter within last 6 months    Recent Outpatient Visits           6 days ago Dizziness   [place] [facility] at [facility], Selinda PARAS, MD   1 month ago Gastrointestinal symptoms   [place] [facility] at [facility], Selinda PARAS, MD   2 months ago Insomnia due to medical condition   [facility] Primary Care & [facility] Medicine at [facility], Selinda PARAS, MD   4 months ago Insomnia due to medical condition   [facility] Primary Care & Sports Medicine at [facility], Selinda PARAS, MD   6 months ago Chronic pain syndrome   [facility] Primary Care & Sports Medicine at [facility], Selinda PARAS, MD

## 2024-03-29 ENCOUNTER — Encounter: Admitting: Neurology

## 2024-04-01 DIAGNOSIS — R1013 Epigastric pain: Secondary | ICD-10-CM | POA: Diagnosis not present

## 2024-04-01 DIAGNOSIS — R112 Nausea with vomiting, unspecified: Secondary | ICD-10-CM | POA: Diagnosis not present

## 2024-04-03 DIAGNOSIS — Z419 Encounter for procedure for purposes other than remedying health state, unspecified: Secondary | ICD-10-CM | POA: Diagnosis not present

## 2024-04-05 DIAGNOSIS — R112 Nausea with vomiting, unspecified: Secondary | ICD-10-CM | POA: Diagnosis not present

## 2024-04-10 ENCOUNTER — Ambulatory Visit: Payer: Self-pay

## 2024-04-10 NOTE — Telephone Encounter (Signed)
 Noted  Pt has appt.  KP

## 2024-04-10 NOTE — Telephone Encounter (Signed)
 FYI Only or Action Required?: FYI only for provider: appointment scheduled on 04/11/24.  Patient was last seen in primary care on 03/21/2024 by Alvia Selinda PARAS, MD.  Called Nurse Triage reporting Loss of Consciousness.  Symptoms began several weeks ago.  Interventions attempted: Nothing.  Symptoms are: stable.  Triage Disposition: See PCP Within 2 Weeks  Patient/caregiver understands and will follow disposition?: Yes Reason for Disposition  Simple fainting is a chronic symptom (has occurred multiple times)  Answer Assessment - Initial Assessment Questions States it happens at night, about 30-40 minutes after taking medication. Patient states she doesn't feel like she loses consciousness, legs give out and falls, but her fiance states she does lose consciousness   1. ONSET: How long were you unconscious? (e.g., minutes, seconds) When did it happen?     Last few nights  2. MENTAL STATUS: Alert and oriented now? (e.g., oriented x 3 = name, month, location)      A&o x3  3. TRIGGER: What do you think caused the fainting? What were you doing just before you fainted?  (e.g., exercise, sudden standing up, prolonged standing)     Medications, unsure which one  4. RECURRENT SYMPTOM: Have you ever passed out before? If Yes, ask: When was the last time? and What happened that time?      Started in October, after starting tazodone, dizziness subsided since discontinuing trazodone   5. INJURY: Did you hurt yourself when you fell?      Denies  6. CARDIAC SYMPTOMS: Have you had any of the following symptoms: chest pain, difficulty breathing, palpitations?     Denies  7. NEUROLOGIC SYMPTOMS: Have you had any of the following symptoms: headache, numbness, vertigo, weakness?     Dizziness and weakness at night after taking medications  8. GI SYMPTOMS: Have you had any of the following symptoms: abdomen pain, vomiting, diarrhea, blood in stools?     Denies  9. OTHER  SYMPTOMS: Do you have any other symptoms?       Denies  Protocols used: Fainting-A-AH  Copied from CRM [id number]. Topic: Clinical - Red Word Triage >> Apr 10, 2024 10:46 AM Larissa RAMAN wrote: [facility] that prompted transfer to Nurse Triage: dizziness with fainting x 2

## 2024-04-11 ENCOUNTER — Ambulatory Visit (INDEPENDENT_AMBULATORY_CARE_PROVIDER_SITE_OTHER): Admitting: Family Medicine

## 2024-04-11 ENCOUNTER — Encounter: Payer: Self-pay | Admitting: Family Medicine

## 2024-04-11 VITALS — BP 100/70 | HR 115 | Ht 69.0 in | Wt 171.0 lb

## 2024-04-11 DIAGNOSIS — H6993 Unspecified Eustachian tube disorder, bilateral: Secondary | ICD-10-CM | POA: Diagnosis not present

## 2024-04-11 DIAGNOSIS — R42 Dizziness and giddiness: Secondary | ICD-10-CM

## 2024-04-11 DIAGNOSIS — F331 Major depressive disorder, recurrent, moderate: Secondary | ICD-10-CM

## 2024-04-11 DIAGNOSIS — G8929 Other chronic pain: Secondary | ICD-10-CM

## 2024-04-11 DIAGNOSIS — M79604 Pain in right leg: Secondary | ICD-10-CM

## 2024-04-11 DIAGNOSIS — M79605 Pain in left leg: Secondary | ICD-10-CM | POA: Diagnosis not present

## 2024-04-11 NOTE — Progress Notes (Signed)
 Primary Care / Sports Medicine Office Visit  Patient Information:  Patient ID: Jamie Reeves, female DOB: 1970-11-05 Age: 53 y.o. MRN: [id number]   Jamie Reeves is a pleasant 53 y.o. female presenting with the following:  Chief Complaint  Patient presents with   Dizziness    Patient presents today for syncope episodes x 2 this week. Patient has passed out on May 02, 2024 and 05/03/24 night. On both occasions she was going from a sit to stand positions took a few steps then fell to the floor. She thinks it may be coming from one of her medications but she just isn't sure which one it could be.     Vitals:   04/11/24 1418  BP: 100/70  Pulse: (!) 115  SpO2: 98%   Vitals:   04/11/24 1418  Weight: 171 lb (77.6 kg)  Height: 5Dec 12, 2025 (1.753 m)   Body mass index is 25.25 kg/m.  No results found.   Discussed the use of AI scribe software for clinical note transcription with the patient, who gave verbal consent to proceed.   Independent interpretation of notes and tests performed by another provider:   None  Procedures performed:   None  Pertinent History, Exam, Impression, and Recommendations:   History of Present Illness Jamie Reeves is a 53 year old female who presents with recurrent episodes of dizziness and syncope.  Dizziness and syncope - Recurrent episodes of dizziness and syncope, with most recent events occurring on 05/02/24 and 05/03/2024 of this week - Dizziness described as 'wobbling' sensation - No head injury sustained during falls - Initial onset of dizziness on October 1st after starting trazodone ; symptoms resolved after discontinuation of trazodone  - Intermittent recurrence of dizziness since October 30th, occurring three to four times without syncope until recent episodes - Dizziness typically occurs when rising after taking nighttime medications (Elavil , Vraylar , tizanidine , gabapentin ) around 9 PM - Tizanidine  and gabapentin  taken consistently every  night  Gastrointestinal symptoms - Ongoing nausea and vomiting, frequently with phlegm production - Completed GI workup including upper endoscopy and swallow study, both normal - Awaiting results from gastric emptying study - GI surgeon indicated possible dysfunction of the gastric flap  Auditory symptoms - Ear echoing, particularly when going out, present for several weeks - Daily use of Claritin for ear symptoms  Visual symptoms - No significant changes in vision  Medication changes - Current medications include Elavil , Vraylar  (recently reduced to 1.5 mg), tizanidine , gabapentin , and Trintellix  (10 mg)  Physical Exam VITALS: P- 147, BP- 100/70 CARDIOVASCULAR: Heart sounds normal. Tachycardia present with a heart rate of 115 bpm.  Assessment and Plan Recurrent dizziness with orthostatic tachycardia Recurrent dizziness and syncope likely due to orthostatic tachycardia. Significant heart rate increase from sitting to standing. Stable blood pressure. Differential includes medication effects, volume depletion, dehydration, and POTS. Dehydration is a concern post-surgery.  - Referred to cardiology for POTS evaluation and further testing, including potential tilt table or stress test. - Ordered blood work for blood count, iron, thyroid , and electrolytes. - Ordered echocardiogram to assess heart function. - Advised increasing fluid intake with electrolytes. - Continue current medication regimen with adjustments as needed.  Chronic low back pain Chronic low back pain contributing to discomfort and potentially affecting heart rate. Previous MRI and nerve conduction studies normal. - Referred to interventional spine group for potential injections. - Coordinated with neurosurgery for further evaluation and management.  Eustachian tube dysfunction with upper respiratory congestion Eustachian tube dysfunction with ear echoing and congestion  likely due to sinus issues. - Recommended Mucinex  to thin mucus and Flonase to open nasal passages. - Advised using medications for one week and then as needed.  Major depressive disorder, ongoing psychiatric management Major depressive disorder managed with Vraylar , Trintellix , and amitriptyline . Recent adjustments include lowering Vraylar  and adding Trintellix . Stress and discomfort may exacerbate symptoms. - Continue current psychiatric medications as prescribed. - Follow up with psychiatrist in one month.  Problem List Items Addressed This Visit     Chronic lower extremity pain (2ry area of Pain) (Bilateral) (L>R) (Chronic)   Depression, major, single episode, moderate (HCC)   Dizziness - Primary   Relevant Orders   CBC (Completed)   Comprehensive metabolic panel with GFR (Completed)   Iron, TIBC and Ferritin Panel (Completed)   TSH (Completed)   Brain natriuretic peptide (Completed)   B12 and Folate Panel (Completed)   Magnesium (Completed)   Dysfunction of both eustachian tubes     Orders & Medications Medications: No orders of the defined types were placed in this encounter.  Orders Placed This Encounter  Procedures   CBC   Comprehensive metabolic panel with GFR   Iron, TIBC and Ferritin Panel   TSH   Brain natriuretic peptide   B12 and Folate Panel   Magnesium     No follow-ups on file.     Selinda JINNY Ku, MD, [facility]   Primary Care Sports Medicine Primary Care and Sports Medicine at [facility]

## 2024-04-13 LAB — COMPREHENSIVE METABOLIC PANEL WITH GFR
ALT: 88 IU/L — ABNORMAL HIGH (ref 0–32)
AST: 118 IU/L — ABNORMAL HIGH (ref 0–40)
Albumin: 3 g/dL — ABNORMAL LOW (ref 3.8–4.9)
Alkaline Phosphatase: 182 IU/L — ABNORMAL HIGH (ref 49–135)
BUN/Creatinine Ratio: 22 (ref 9–23)
BUN: 14 mg/dL (ref 6–24)
Bilirubin Total: 1 mg/dL (ref 0.0–1.2)
CO2: 20 mmol/L (ref 20–29)
Calcium: 8.6 mg/dL — ABNORMAL LOW (ref 8.7–10.2)
Chloride: 105 mmol/L (ref 96–106)
Creatinine, Ser: 0.65 mg/dL (ref 0.57–1.00)
Globulin, Total: 3 g/dL (ref 1.5–4.5)
Glucose: 98 mg/dL (ref 70–99)
Potassium: 4.5 mmol/L (ref 3.5–5.2)
Sodium: 138 mmol/L (ref 134–144)
Total Protein: 6 g/dL (ref 6.0–8.5)
eGFR: 106 mL/min/1.73 (ref >59)

## 2024-04-13 LAB — B12 AND FOLATE PANEL
Folate: 4.9 ng/mL (ref >3.0)
Vitamin B-12: >2000 pg/mL — ABNORMAL HIGH (ref 232–1245)

## 2024-04-13 LAB — CBC
Hematocrit: 45.8 % (ref 34.0–46.6)
Hemoglobin: 14.3 g/dL (ref 11.1–15.9)
MCH: 28.5 pg (ref 26.6–33.0)
MCHC: 31.2 g/dL — ABNORMAL LOW (ref 31.5–35.7)
MCV: 91 fL (ref 79–97)
Platelets: 253 x10E3/uL (ref 150–450)
RBC: 5.02 x10E6/uL (ref 3.77–5.28)
RDW: 12.8 % (ref 11.7–15.4)
WBC: 7.7 x10E3/uL (ref 3.4–10.8)

## 2024-04-13 LAB — TSH: TSH: 2.94 u[IU]/mL (ref 0.450–4.500)

## 2024-04-13 LAB — IRON,TIBC AND FERRITIN PANEL
Ferritin: 318 ng/mL — ABNORMAL HIGH (ref 15–150)
Iron Saturation: 66 % — ABNORMAL HIGH (ref 15–55)
Iron: 105 ug/dL (ref 27–159)
Total Iron Binding Capacity: 158 ug/dL — ABNORMAL LOW (ref 250–450)
UIBC: 53 ug/dL — ABNORMAL LOW (ref 131–425)

## 2024-04-13 LAB — BRAIN NATRIURETIC PEPTIDE: BNP: 17.1 pg/mL (ref 0.0–100.0)

## 2024-04-13 LAB — MAGNESIUM: Magnesium: 1.9 mg/dL (ref 1.6–2.3)

## 2024-04-15 ENCOUNTER — Encounter: Payer: Self-pay | Admitting: Family Medicine

## 2024-04-17 DIAGNOSIS — R112 Nausea with vomiting, unspecified: Secondary | ICD-10-CM | POA: Diagnosis not present

## 2024-04-17 DIAGNOSIS — Z6825 Body mass index (BMI) 25.0-25.9, adult: Secondary | ICD-10-CM | POA: Diagnosis not present

## 2024-04-20 DIAGNOSIS — H6993 Unspecified Eustachian tube disorder, bilateral: Secondary | ICD-10-CM | POA: Insufficient documentation

## 2024-04-20 NOTE — Patient Instructions (Signed)
 VISIT SUMMARY:  During your visit, we discussed your recurrent dizziness and syncope, gastrointestinal symptoms, ear echoing, and chronic low back pain. We also reviewed your current medications and made some recommendations for further evaluation and management.  YOUR PLAN:  RECURRENT DIZZINESS WITH ORTHOSTATIC TACHYCARDIA: Your dizziness and fainting spells are likely due to a condition called orthostatic tachycardia, where your heart rate increases significantly when you stand up. -You are referred to cardiology for further evaluation, including possible tilt table or stress tests. -Blood work has been ordered to check your blood count, iron levels, thyroid  function, and electrolytes. -An echocardiogram has been ordered to assess your heart function. -Increase your fluid intake and include electrolytes in your drinks. -Continue your current medications, but adjustments may be made as needed.  CHRONIC LOW BACK PAIN: Your ongoing low back pain may be contributing to your discomfort and affecting your heart rate. -You are referred to an interventional spine group for potential injections. -Coordination with neurosurgery for further evaluation and management.  EUSTACHIAN TUBE DYSFUNCTION WITH UPPER RESPIRATORY CONGESTION: Your ear echoing and congestion are likely due to sinus issues affecting your Eustachian tubes. -Use Mucinex to thin the mucus and Flonase to open your nasal passages. -Use these medications for one week and then as needed.  MAJOR DEPRESSIVE DISORDER, ONGOING PSYCHIATRIC MANAGEMENT: Your depression is being managed with your current medications, and recent adjustments have been made. -Continue taking your psychiatric medications as prescribed. -Follow up with your psychiatrist in one month.

## 2024-04-24 ENCOUNTER — Ambulatory Visit: Admitting: Psychiatry

## 2024-04-24 ENCOUNTER — Encounter: Payer: Self-pay | Admitting: Psychiatry

## 2024-04-24 ENCOUNTER — Other Ambulatory Visit: Payer: Self-pay

## 2024-04-24 VITALS — BP 103/71 | HR 113 | Temp 97.2°F | Ht 69.0 in | Wt 169.2 lb

## 2024-04-24 DIAGNOSIS — F5105 Insomnia due to other mental disorder: Secondary | ICD-10-CM

## 2024-04-24 DIAGNOSIS — F331 Major depressive disorder, recurrent, moderate: Secondary | ICD-10-CM

## 2024-04-24 DIAGNOSIS — F321 Major depressive disorder, single episode, moderate: Secondary | ICD-10-CM

## 2024-04-24 DIAGNOSIS — F411 Generalized anxiety disorder: Secondary | ICD-10-CM

## 2024-04-24 DIAGNOSIS — G894 Chronic pain syndrome: Secondary | ICD-10-CM

## 2024-04-24 MED ORDER — AMITRIPTYLINE HCL 10 MG PO TABS: 10.0000 mg | ORAL_TABLET | Freq: Every day | ORAL | 1 refills | Status: AC
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
## 2024-05-03 ENCOUNTER — Other Ambulatory Visit: Payer: Self-pay | Admitting: Family Medicine

## 2024-05-06 ENCOUNTER — Ambulatory Visit: Payer: Self-pay | Admitting: Family Medicine

## 2024-05-06 DIAGNOSIS — R7401 Elevation of levels of liver transaminase levels: Secondary | ICD-10-CM

## 2024-05-06 NOTE — Telephone Encounter (Signed)
 Requested Prescriptions  Pending Prescriptions Disp Refills   oxybutynin  (DITROPAN -XL) 10 MG 24 hr tablet [Pharmacy Med Name: OXYBUTYNIN  ER 10MG  TABLETS] 90 tablet 0    Sig: TAKE 1 TABLET(10 MG) BY MOUTH DAILY     Urology:  Bladder Agents Passed - 05/06/2024  2:26 PM      Passed - Valid encounter within last 12 months    Recent Outpatient Visits           3 weeks ago Dizziness   [place] [facility], Selinda PARAS, MD   1 month ago Dizziness   [facility] & [facility] Medicine at [facility], Selinda PARAS, MD   2 months ago Gastrointestinal symptoms   [place] [facility] Medicine at [facility], Selinda PARAS, MD   3 months ago Insomnia due to medical condition   [facility] Primary Care & Sports Medicine at [facility], Selinda PARAS, MD   5 months ago Insomnia due to medical condition   [facility] Primary Care & Sports Medicine at [facility], Jason J, MD

## 2024-05-06 NOTE — Telephone Encounter (Signed)
 Please go over labs with patient. Thank you.  JM

## 2024-05-07 DIAGNOSIS — K311 Adult hypertrophic pyloric stenosis: Secondary | ICD-10-CM | POA: Diagnosis not present

## 2024-05-10 ENCOUNTER — Other Ambulatory Visit: Payer: Self-pay | Admitting: Family Medicine

## 2024-05-10 ENCOUNTER — Other Ambulatory Visit: Payer: Self-pay

## 2024-05-10 DIAGNOSIS — G2581 Restless legs syndrome: Secondary | ICD-10-CM

## 2024-05-10 MED ORDER — ROPINIROLE HCL 0.25 MG PO TABS: ORAL_TABLET | ORAL | 3 refills | Status: AC

## 2024-05-13 ENCOUNTER — Ambulatory Visit
Admission: RE | Admit: 2024-05-13 | Discharge: 2024-05-13 | Disposition: A | Discharge status: Home / Self Care | Source: Ambulatory Visit | Attending: Family Medicine | Admitting: Family Medicine

## 2024-05-13 ENCOUNTER — Telehealth: Payer: Self-pay

## 2024-05-13 DIAGNOSIS — Z6824 Body mass index (BMI) 24.0-24.9, adult: Secondary | ICD-10-CM | POA: Diagnosis not present

## 2024-05-13 DIAGNOSIS — K311 Adult hypertrophic pyloric stenosis: Secondary | ICD-10-CM | POA: Diagnosis not present

## 2024-05-13 DIAGNOSIS — K760 Fatty (change of) liver, not elsewhere classified: Secondary | ICD-10-CM | POA: Diagnosis not present

## 2024-05-13 DIAGNOSIS — R7401 Elevation of levels of liver transaminase levels: Secondary | ICD-10-CM | POA: Insufficient documentation

## 2024-05-13 DIAGNOSIS — K838 Other specified diseases of biliary tract: Secondary | ICD-10-CM | POA: Diagnosis not present

## 2024-05-13 DIAGNOSIS — Z9884 Bariatric surgery status: Secondary | ICD-10-CM | POA: Diagnosis not present

## 2024-05-13 DIAGNOSIS — K824 Cholesterolosis of gallbladder: Secondary | ICD-10-CM | POA: Diagnosis not present

## 2024-05-13 DIAGNOSIS — R112 Nausea with vomiting, unspecified: Secondary | ICD-10-CM | POA: Diagnosis not present

## 2024-05-13 NOTE — Telephone Encounter (Signed)
 Prior authorization submitted for the patients Vraylar  1.5 mg capsules via CoverMyMeds sent to [facility]    Approved  Coverage 05-13-24///05-16-25  Patient made aware of the approval

## 2024-05-14 ENCOUNTER — Other Ambulatory Visit (HOSPITAL_COMMUNITY): Payer: Self-pay

## 2024-05-14 ENCOUNTER — Telehealth: Payer: Self-pay | Admitting: Pharmacy Technician

## 2024-05-14 LAB — PROTIME-INR
INR: 1.1 (ref 0.9–1.2)
Prothrombin Time: 12.2 s — ABNORMAL HIGH (ref 9.1–12.0)

## 2024-05-14 LAB — COMPREHENSIVE METABOLIC PANEL WITH GFR
ALT: 39 IU/L — ABNORMAL HIGH (ref 0–32)
AST: 61 IU/L — ABNORMAL HIGH (ref 0–40)
Albumin: 2.6 g/dL — ABNORMAL LOW (ref 3.8–4.9)
Alkaline Phosphatase: 153 IU/L — ABNORMAL HIGH (ref 49–135)
BUN/Creatinine Ratio: 18 (ref 9–23)
BUN: 14 mg/dL (ref 6–24)
Bilirubin Total: 1.1 mg/dL (ref 0.0–1.2)
CO2: 23 mmol/L (ref 20–29)
Calcium: 8.5 mg/dL — ABNORMAL LOW (ref 8.7–10.2)
Chloride: 104 mmol/L (ref 96–106)
Creatinine, Ser: 0.78 mg/dL (ref 0.57–1.00)
Globulin, Total: 2.8 g/dL (ref 1.5–4.5)
Glucose: 91 mg/dL (ref 70–99)
Potassium: 4.8 mmol/L (ref 3.5–5.2)
Sodium: 137 mmol/L (ref 134–144)
Total Protein: 5.4 g/dL — ABNORMAL LOW (ref 6.0–8.5)
eGFR: 91 mL/min/1.73

## 2024-05-14 LAB — HEMOGLOBIN A1C
Est. average glucose Bld gHb Est-mCnc: 85 mg/dL
Hgb A1c MFr Bld: 4.6 % — ABNORMAL LOW (ref 4.8–5.6)

## 2024-05-14 LAB — HAV, HBV PANEL
Hep B Core Total Ab: NEGATIVE
Hep B Surface Ab, Qual: NONREACTIVE
Hepatitis B Surface Ag: NEGATIVE
hep A Total Ab: NEGATIVE

## 2024-05-14 NOTE — Telephone Encounter (Signed)
 Pharmacy Patient Advocate Encounter   Received notification from Onbase that prior authorization for Auvelity  45-105MG  er tablets is due for renewal.   Insurance verification completed.   The patient is insured through [facility] MEDICAID.  Action: Medication has been discontinued. Archived Key: [id number]

## 2024-05-14 NOTE — Telephone Encounter (Signed)
 Requested medication (s) are due for refill today - yes  Requested medication (s) are on the active medication list -yes  Future visit scheduled -yes  Last refill: 02/21/24 #30  Notes to clinic: non delegated Rx  Requested Prescriptions  Pending Prescriptions Disp Refills   ondansetron  (ZOFRAN -ODT) 4 MG disintegrating tablet [Pharmacy Med Name: ONDANSETRON  ODT 4MG  TABLETS] 30 tablet 0    Sig: TAKE 1 TABLET BY MOUTH EVERY 6 HOURS AS NEEDED     Not Delegated - Gastroenterology: Antiemetics - ondansetron  Failed - 05/14/2024  3:30 PM      Failed - This refill cannot be delegated      Failed - AST in normal range and within 360 days    AST  Date Value Ref Range Status  05/13/2024 61 (H) 0 - 40 IU/L Final         Failed - ALT in normal range and within 360 days    ALT  Date Value Ref Range Status  05/13/2024 39 (H) 0 - 32 IU/L Final         Passed - Valid encounter within last 6 months    Recent Outpatient Visits           1 month ago Dizziness   [place] [facility] at [facility], Selinda PARAS, MD   1 month ago Dizziness   [facility] at [facility], Selinda PARAS, MD   2 months ago Gastrointestinal symptoms   [place] [facility] at [facility], Selinda PARAS, MD   4 months ago Insomnia due to medical condition   [place] [facility] at [facility], Selinda PARAS, MD   5 months ago Insomnia due to medical condition   [place] [facility] at [facility], Selinda PARAS, MD                 Requested Prescriptions  Pending Prescriptions Disp Refills   ondansetron  (ZOFRAN -ODT) 4 MG disintegrating tablet [Pharmacy Med Name: ONDANSETRON  ODT 4MG  TABLETS] 30 tablet 0    Sig: TAKE 1 TABLET BY MOUTH EVERY 6 HOURS AS NEEDED     Not Delegated - Gastroenterology: Antiemetics - ondansetron  Failed - 05/14/2024  3:30 PM       Failed - This refill cannot be delegated      Failed - AST in normal range and within 360 days    AST  Date Value Ref Range Status  05/13/2024 61 (H) 0 - 40 IU/L Final         Failed - ALT in normal range and within 360 days    ALT  Date Value Ref Range Status  05/13/2024 39 (H) 0 - 32 IU/L Final         Passed - Valid encounter within last 6 months    Recent Outpatient Visits           1 month ago Dizziness   [place] [facility] at [facility], Selinda PARAS, MD   1 month ago Dizziness   [facility] at [facility], Selinda PARAS, MD   2 months ago Gastrointestinal symptoms   [facility] at [facility], Selinda PARAS, MD   4 months ago Insomnia due to medical condition   [facility] Primary Care & Sports Medicine at  [facility] Selinda JINNY, MD   5 months ago Insomnia due to medical condition   [facility] Primary Care & Sports Medicine at [facility], Jason J, MD

## 2024-05-25 ENCOUNTER — Other Ambulatory Visit: Payer: Self-pay | Admitting: Physician Assistant

## 2024-06-11 ENCOUNTER — Encounter: Payer: Self-pay | Admitting: Family Medicine

## 2024-06-11 ENCOUNTER — Ambulatory Visit: Admitting: Family Medicine

## 2024-06-11 VITALS — BP 100/60 | HR 97 | Ht 69.0 in | Wt 161.0 lb

## 2024-06-11 DIAGNOSIS — G8929 Other chronic pain: Secondary | ICD-10-CM | POA: Diagnosis not present

## 2024-06-11 DIAGNOSIS — J3089 Other allergic rhinitis: Secondary | ICD-10-CM

## 2024-06-11 DIAGNOSIS — M545 Low back pain, unspecified: Secondary | ICD-10-CM | POA: Diagnosis not present

## 2024-06-11 DIAGNOSIS — R112 Nausea with vomiting, unspecified: Secondary | ICD-10-CM | POA: Insufficient documentation

## 2024-06-11 DIAGNOSIS — R42 Dizziness and giddiness: Secondary | ICD-10-CM

## 2024-06-11 MED ORDER — ONDANSETRON 8 MG PO TBDP: 8.0000 mg | ORAL_TABLET | Freq: Three times a day (TID) | ORAL | 1 refills | Status: AC | PRN

## 2024-06-11 MED ORDER — PROMETHAZINE HCL 25 MG RE SUPP: 25.0000 mg | Freq: Four times a day (QID) | RECTAL | 0 refills | Status: AC | PRN

## 2024-06-11 MED ORDER — AZELASTINE HCL 0.1 % NA SOLN: 2.0000 | Freq: Two times a day (BID) | NASAL | 1 refills | Status: AC

## 2024-06-11 NOTE — Assessment & Plan Note (Signed)
" °  Orders:   promethazine  (PHENERGAN ) 25 MG suppository; Place 1 suppository (25 mg total) rectally every 6 (six) hours as needed for nausea or vomiting.   ondansetron  (ZOFRAN -ODT) 8 MG disintegrating tablet; Take 1 tablet (8 mg total) by mouth every 8 (eight) hours as needed for nausea.  "

## 2024-06-11 NOTE — Assessment & Plan Note (Signed)
 SABRA

## 2024-06-11 NOTE — Assessment & Plan Note (Signed)
" °  Orders:   azelastine  (ASTELIN ) 0.1 % nasal spray; Place 2 sprays into both nostrils 2 (two) times daily. Use in each nostril as directed  "

## 2024-06-11 NOTE — Progress Notes (Signed)
 "    Primary Care / Sports Medicine Office Visit  Patient Information:  Patient ID: Jamie Reeves, female DOB: June 27, 1970 Age: 54 y.o. MRN: [id number]   Jamie Reeves is a pleasant 54 y.o. female presenting with the following:  Chief Complaint  Patient presents with   Dizziness    Patient continues to have dizzy spells going from a sit to stand position and sometimes when walking.     Vitals:   06/11/24 1049  BP: 100/60  Pulse: 97  SpO2: 99%   Vitals:   06/11/24 1049  Weight: 161 lb (73 kg)  Height: 5' 9 (1.753 m)   Body mass index is 23.78 kg/m.  Independent interpretation of notes and tests performed by another provider:   None  Procedures performed:   None  Pertinent History, Exam, Impression, and Recommendations:   Discussed the use of AI scribe software for clinical note transcription with the patient, who gave verbal consent to proceed.  History of Present Illness   Jamie Reeves is a 54 year old female with gastrointestinal structural abnormality who presents for follow-up of persistent dizziness and nutritional compromise.  Gastrointestinal Symptoms and Nutritional Compromise: - Persistent vomiting with inability to tolerate oral intake, including large pills and oral zinc supplementation - Nausea partially controlled with ondansetron , but supply is limited - No use of promethazine  suppositories - Ongoing weight loss of 6 pounds over the past four weeks - Awaiting corrective surgery for gastrointestinal flap abnormality and cholecystectomy - Underwent balloon placement and botulinum toxin injection for gastrointestinal flap abnormality 4-6 weeks ago, with only partial improvement - Continues to have poor absorption and requires intravenous zinc supplementation - No additional gastrointestinal workup or specialist visits since last procedure; no further testing scheduled prior to surgery - No chest pain, shortness of breath, or hypoglycemic  symptoms  Dizziness and Fatigue: - Severe dizziness requiring assistance with activities of daily living, including showering and washing hair, despite use of a shower chair - Profound fatigue - Echocardiogram previously ordered but not completed due to lack of scheduling contact  Cutaneous Lesion: - Tender, irritated mole under bra line with local trauma from friction - No fever or systemic symptoms       Assessment and Plan    Severe malnutrition with persistent vomiting due to gastrointestinal structural abnormality Severe malnutrition and persistent 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 Significant weight loss, dizziness, poor nutrient absorption. Previous interventions provided partial relief. Scheduled for corrective surgery in seven weeks, including cholecystectomy. Inpatient management to include PICC line for parenteral nutrition. Electrolyte depletion and hypozincemia noted. Nausea limits oral intake. - Reordered echocardiogram for cardiac baseline pre-surgery. - Prescribed ondansetron  oral dissolving tablets PRN for nausea. - Prescribed promethazine  suppositories PRN for nausea. - Recommended increased intake of electrolyte-rich fluids. - Advised focus on rest and nutrition as tolerated. - Planned IV zinc supplementation for hypozincemia. - Instructed to contact if additional labs/tests required by GI surgery team. - Encouraged reporting of antiemetic efficacy or need for refills.  Chronic low back pain Chronic low back pain persists but not primary concern. Referral to interventional pain management made, awaiting contact. - Reviewed referral status to interventional pain management, will follow up with Dr. Wallie Donovan.  Chronic allergic rhinitis Significant symptoms of allergic rhinitis persist despite fluticasone and loratadine. Discontinued guaifenesin due to pill size. Symptoms bothersome. - Prescribed azelastine  nasal  spray to add to regimen. - Advised continued use of fluticasone with proper spray technique. - Encouraged increased  fluid intake to thin secretions.       Assessment & Plan Dizziness  Orders:   ECHOCARDIOGRAM COMPLETE; Future  Allergic rhinitis due to other allergic trigger, unspecified seasonality  Orders:   azelastine  (ASTELIN ) 0.1 % nasal spray; Place 2 sprays into both nostrils 2 (two) times daily. Use in each nostril as directed  Chronic low back pain (1ry area of Pain) (Bilateral) (Midline) (R>L) w/o sciatica     Nausea and vomiting, unspecified vomiting type  Orders:   promethazine  (PHENERGAN ) 25 MG suppository; Place 1 suppository (25 mg total) rectally every 6 (six) hours as needed for nausea or vomiting.   ondansetron  (ZOFRAN -ODT) 8 MG disintegrating tablet; Take 1 tablet (8 mg total) by mouth every 8 (eight) hours as needed for nausea.   No follow-ups on file.     Jamie JINNY Ku, MD, [facility]   Primary Care Sports Medicine Primary Care and Sports Medicine at [facility]   "

## 2024-06-11 NOTE — Patient Instructions (Addendum)
" °  VISIT SUMMARY: Jamie Reeves, a 54 year old female, visited for follow-up on persistent dizziness and nutritional issues due to a gastrointestinal structural abnormality. She also reported severe malnutrition, chronic low back pain, allergic rhinitis, and an irritated skin lesion.  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 -You are scheduled for corrective surgery in seven weeks, including a cholecystectomy.. -I have ordered an echocardiogram for your cardiac baseline before surgery. -You are prescribed ondansetron  oral dissolving tablets as needed for nausea. -You are prescribed promethazine  suppositories as needed for nausea. -Increase your intake of electrolyte-rich fluids. -Focus on rest and nutrition as tolerated. -You will receive IV zinc supplementation for hypozincemia. -Contact us  if additional labs or tests are required by the GI surgery team. -Report the effectiveness of antiemetics or if you need refills.  CHRONIC LOW BACK PAIN: You have chronic low back pain, which is not the primary concern at this time. -We have referred you to interventional pain management and will follow up with Dr. Wallie Donovan.  CHRONIC ALLERGIC RHINITIS: You have significant symptoms of allergic rhinitis despite current medications. -You are prescribed azelastine  nasal spray to add to your regimen. -Continue using fluticasone with proper spray technique. -Increase your fluid intake to thin secretions.    Contains text generated by [employer].   "

## 2024-06-11 NOTE — Assessment & Plan Note (Signed)
  Orders:   ECHOCARDIOGRAM COMPLETE; Future

## 2024-06-26 ENCOUNTER — Other Ambulatory Visit: Payer: Self-pay | Admitting: Family Medicine

## 2024-06-27 ENCOUNTER — Ambulatory Visit
Admission: RE | Admit: 2024-06-27 | Discharge: 2024-06-27 | Disposition: A | Source: Ambulatory Visit | Attending: Family Medicine

## 2024-06-27 DIAGNOSIS — R42 Dizziness and giddiness: Secondary | ICD-10-CM

## 2024-06-27 LAB — ECHOCARDIOGRAM COMPLETE
AR max vel: 2.5 cm2
AV Area VTI: 2.18 cm2
AV Area mean vel: 2.41 cm2
AV Mean grad: 3 mmHg
AV Peak grad: 6 mmHg
Ao pk vel: 1.22 m/s
Area-P 1/2: 5.23 cm2
Calc EF: 67.9 %
S' Lateral: 2.5 cm
Single Plane A2C EF: 67.5 %
Single Plane A4C EF: 68 %

## 2024-06-28 NOTE — Telephone Encounter (Signed)
 Requested medication (s) are due for refill today: yes  Requested medication (s) are on the active medication list: yes  Last refill:  10/11/23 #90  Future visit scheduled: yes  Notes to clinic:  med not delegated to NT to RF   Requested Prescriptions  Pending Prescriptions Disp Refills   tiZANidine  (ZANAFLEX ) 4 MG tablet [Pharmacy Med Name: TIZANIDINE  4MG  TABLETS] 90 tablet 0    Sig: TAKE 1 TABLET(4 MG) BY MOUTH EVERY 8 HOURS AS NEEDED FOR MUSCLE SPASMS     Not Delegated - Cardiovascular:  Alpha-2 Agonists - tizanidine  Failed - 06/28/2024 10:59 AM      Failed - This refill cannot be delegated      Passed - Valid encounter within last 6 months    Recent Outpatient Visits           2 weeks ago Dizziness   [place] [facility], Selinda PARAS, MD   2 months ago Dizziness   [facility] at [facility], Selinda PARAS, MD   3 months ago Dizziness   [facility] Medicine at [facility], Selinda PARAS, MD   4 months ago Gastrointestinal symptoms   [facility] at [facility], Selinda PARAS, MD   5 months ago Insomnia due to medical condition   [facility] Primary Care & [facility] Medicine at [facility], Jason J, MD
